# Patient Record
Sex: Female | Born: 1935 | Race: Black or African American | Hispanic: No | Smoking: Never smoker
Health system: Southern US, Community
[De-identification: ages and names within clinical notes are randomized; demographics above are authoritative.]

## PROBLEM LIST (undated history)

## (undated) DIAGNOSIS — K219 Gastro-esophageal reflux disease without esophagitis: Secondary | ICD-10-CM

## (undated) DIAGNOSIS — I10 Essential (primary) hypertension: Secondary | ICD-10-CM

## (undated) DIAGNOSIS — F419 Anxiety disorder, unspecified: Secondary | ICD-10-CM

## (undated) DIAGNOSIS — F0390 Unspecified dementia without behavioral disturbance: Secondary | ICD-10-CM

## (undated) DIAGNOSIS — E43 Unspecified severe protein-calorie malnutrition: Secondary | ICD-10-CM

## (undated) DIAGNOSIS — R1312 Dysphagia, oropharyngeal phase: Secondary | ICD-10-CM

## (undated) DIAGNOSIS — E875 Hyperkalemia: Secondary | ICD-10-CM

## (undated) DIAGNOSIS — D649 Anemia, unspecified: Secondary | ICD-10-CM

## (undated) HISTORY — DX: Dysphagia, oropharyngeal phase: R13.12

## (undated) HISTORY — PX: APPENDECTOMY: SHX54

## (undated) HISTORY — DX: Anemia, unspecified: D64.9

## (undated) HISTORY — DX: Hyperkalemia: E87.5

## (undated) HISTORY — DX: Gastro-esophageal reflux disease without esophagitis: K21.9

## (undated) HISTORY — DX: Unspecified severe protein-calorie malnutrition: E43

## (undated) HISTORY — PX: ABDOMINAL HYSTERECTOMY: SHX81

---

## 2012-12-04 ENCOUNTER — Encounter (HOSPITAL_COMMUNITY): Payer: Self-pay | Admitting: *Deleted

## 2012-12-04 ENCOUNTER — Emergency Department (HOSPITAL_COMMUNITY)
Admission: EM | Admit: 2012-12-04 | Discharge: 2012-12-04 | Disposition: A | Discharge status: Home / Self Care | Payer: Medicare Other | Attending: Emergency Medicine | Admitting: Emergency Medicine

## 2012-12-04 DIAGNOSIS — Z8659 Personal history of other mental and behavioral disorders: Secondary | ICD-10-CM | POA: Insufficient documentation

## 2012-12-04 DIAGNOSIS — F0390 Unspecified dementia without behavioral disturbance: Secondary | ICD-10-CM | POA: Insufficient documentation

## 2012-12-04 DIAGNOSIS — I10 Essential (primary) hypertension: Secondary | ICD-10-CM | POA: Insufficient documentation

## 2012-12-04 DIAGNOSIS — Z4802 Encounter for removal of sutures: Secondary | ICD-10-CM

## 2012-12-04 HISTORY — DX: Unspecified dementia, unspecified severity, without behavioral disturbance, psychotic disturbance, mood disturbance, and anxiety: F03.90

## 2012-12-04 HISTORY — DX: Essential (primary) hypertension: I10

## 2012-12-04 HISTORY — DX: Anxiety disorder, unspecified: F41.9
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
## 2012-12-04 NOTE — ED Notes (Signed)
Here for suture removal,  Pt is from [facility]. Alert.

## 2012-12-04 NOTE — ED Notes (Signed)
Here for suture removal from scalp, Pt is from [facility], Has an attendant with her.  Alert, talking, but hx of dementia.  Thinks sutures were put in at [facility].   Area had lg amt of scabbing.  .  Sutures removed. And released in care of attendant.

## 2012-12-05 NOTE — ED Provider Notes (Signed)
Medical screening examination/treatment/procedure(s) were performed by non-physician practitioner and as supervising physician I was immediately available for consultation/collaboration.    Dot Lanes, MD 12/05/12 [phone]

## 2012-12-22 ENCOUNTER — Encounter (HOSPITAL_COMMUNITY): Payer: Self-pay | Admitting: *Deleted

## 2012-12-22 ENCOUNTER — Emergency Department (HOSPITAL_COMMUNITY)
Admission: EM | Admit: 2012-12-22 | Discharge: 2012-12-22 | Disposition: A | Discharge status: Home / Self Care | Payer: Medicare Other | Attending: Emergency Medicine | Admitting: Emergency Medicine

## 2012-12-22 ENCOUNTER — Other Ambulatory Visit: Payer: Self-pay

## 2012-12-22 DIAGNOSIS — F0390 Unspecified dementia without behavioral disturbance: Secondary | ICD-10-CM | POA: Insufficient documentation

## 2012-12-22 DIAGNOSIS — Z8659 Personal history of other mental and behavioral disorders: Secondary | ICD-10-CM | POA: Insufficient documentation

## 2012-12-22 DIAGNOSIS — N390 Urinary tract infection, site not specified: Secondary | ICD-10-CM

## 2012-12-22 DIAGNOSIS — I10 Essential (primary) hypertension: Secondary | ICD-10-CM | POA: Insufficient documentation

## 2012-12-22 DIAGNOSIS — R609 Edema, unspecified: Secondary | ICD-10-CM | POA: Insufficient documentation

## 2012-12-22 LAB — CBC WITH DIFFERENTIAL/PLATELET
Basophils Absolute: 0 10*3/uL (ref 0.0–0.1)
Basophils Relative: 1 % (ref 0–1)
Eosinophils Absolute: 0.2 10*3/uL (ref 0.0–0.7)
Eosinophils Relative: 5 % (ref 0–5)
HCT: 28 % — ABNORMAL LOW (ref 36.0–46.0)
Hemoglobin: 9.4 g/dL — ABNORMAL LOW (ref 12.0–15.0)
Lymphocytes Relative: 25 % (ref 12–46)
Lymphs Abs: 1 10*3/uL (ref 0.7–4.0)
MCH: 33.6 pg (ref 26.0–34.0)
MCHC: 33.6 g/dL (ref 30.0–36.0)
MCV: 100 fL (ref 78.0–100.0)
Monocytes Absolute: 0.5 10*3/uL (ref 0.1–1.0)
Monocytes Relative: 11 % (ref 3–12)
Neutro Abs: 2.4 10*3/uL (ref 1.7–7.7)
Neutrophils Relative %: 58 % (ref 43–77)
Platelets: 210 10*3/uL (ref 150–400)
RBC: 2.8 MIL/uL — ABNORMAL LOW (ref 3.87–5.11)
RDW: 15.6 % — ABNORMAL HIGH (ref 11.5–15.5)
WBC: 4.1 10*3/uL (ref 4.0–10.5)

## 2012-12-22 LAB — URINALYSIS, ROUTINE W REFLEX MICROSCOPIC
Bilirubin Urine: NEGATIVE
Glucose, UA: NEGATIVE mg/dL
Ketones, ur: NEGATIVE mg/dL
Nitrite: POSITIVE — AB
Protein, ur: NEGATIVE mg/dL
Specific Gravity, Urine: 1.01 (ref 1.005–1.030)
Urobilinogen, UA: 0.2 mg/dL (ref 0.0–1.0)
pH: 6.5 (ref 5.0–8.0)

## 2012-12-22 LAB — URINE MICROSCOPIC-ADD ON

## 2012-12-22 LAB — COMPREHENSIVE METABOLIC PANEL
ALT: 14 U/L (ref 0–35)
AST: 26 U/L (ref 0–37)
Albumin: 3.6 g/dL (ref 3.5–5.2)
Alkaline Phosphatase: 143 U/L — ABNORMAL HIGH (ref 39–117)
BUN: 19 mg/dL (ref 6–23)
CO2: 25 mEq/L (ref 19–32)
Calcium: 9.4 mg/dL (ref 8.4–10.5)
Chloride: 103 mEq/L (ref 96–112)
Creatinine, Ser: 1.07 mg/dL (ref 0.50–1.10)
GFR calc Af Amer: 52 mL/min — ABNORMAL LOW (ref >90)
GFR calc non Af Amer: 45 mL/min — ABNORMAL LOW (ref >90)
Glucose, Bld: 78 mg/dL (ref 70–99)
Potassium: 4.8 mEq/L (ref 3.5–5.1)
Sodium: 139 mEq/L (ref 135–145)
Total Bilirubin: 0.2 mg/dL — ABNORMAL LOW (ref 0.3–1.2)
Total Protein: 7.1 g/dL (ref 6.0–8.3)

## 2012-12-22 MED ORDER — CEFTRIAXONE SODIUM 1 G IJ SOLR
1.0000 g | Freq: Once | INTRAMUSCULAR | Status: AC
  Administered 2012-12-22: 1 g via INTRAMUSCULAR
  Filled 2012-12-22: qty 10

## 2012-12-22 MED ORDER — LIDOCAINE HCL (PF) 1 % IJ SOLN
INTRAMUSCULAR | Status: AC
  Administered 2012-12-22: 5 mL
  Filled 2012-12-22: qty 5

## 2012-12-22 MED ORDER — CEPHALEXIN 500 MG PO CAPS: 500.0000 mg | ORAL_CAPSULE | Freq: Four times a day (QID) | ORAL | Status: DC

## 2012-12-22 NOTE — ED Provider Notes (Signed)
History     This chart was scribed for Virgel Manifold, MD by Jeralyn Ruths, ED Scribe. The patient was seen in room APA05/APA05 and the patient's care was started at 2:45 PM.  CSN: [id number]  Arrival date & time 12/22/12  1356  Chief Complaint  Patient presents with  . Weakness   The history is provided by the patient and medical records. No language interpreter was used.  Pt is a level 5 caveat due to dementia. HPI Comments: Morgan [name] is a 79 y.o. female with a hx of HTN, who presents to the Emergency Department from [facility] complaining of ongoing intermittent fatigue for the past month.  Nursing home staff reports urinary incontinence this morning.  Pt denies recent falls, headache, diaphoresis, fever, chills, nausea, vomiting, diarrhea, cough, SOB and any other pain. Pt denies new medications or changes in diet.  Past Medical History  Diagnosis Date  . Hypertension   . Dementia   . Anxiety     Past Surgical History  Procedure Laterality Date  . Appendectomy    . Abdominal hysterectomy      History reviewed. No pertinent family history.  History  Substance Use Topics  . Smoking status: Never Smoker   . Smokeless tobacco: Not on file  . Alcohol Use: No    OB History   Grav Para Term Preterm Abortions TAB SAB Ect Mult Living                  Review of Systems  Unable to perform ROS: Dementia    Allergies  Review of patient's allergies indicates no known allergies.  Home Medications  No current outpatient prescriptions on file.  Triage Vitals:BP 156/76  Pulse 78  Temp(Src) 97 F (36.1 C) (Oral)  Resp 20  Wt 130 lb (58.968 kg)  SpO2 100%  Physical Exam  Nursing note and vitals reviewed. Constitutional: She appears well-developed and well-nourished. No distress.  HENT:  Head: Normocephalic and atraumatic.  Eyes: Conjunctivae are normal. Pupils are equal, round, and reactive to light. Right eye exhibits no discharge. Left eye exhibits no  discharge.  Neck: Neck supple.  Cardiovascular: Normal rate, regular rhythm and normal heart sounds.  Exam reveals no gallop and no friction rub.   No murmur heard. Pulmonary/Chest: Effort normal and breath sounds normal. No respiratory distress.  Abdominal: Soft. She exhibits no distension. There is no tenderness.  Musculoskeletal: She exhibits no edema and no tenderness.  Pitting lower extremity edema, right worse than left.  Neurological: She is alert.  Skin: Skin is warm and dry.  Psychiatric: She has a normal mood and affect. Her behavior is normal. Thought content normal.    ED Course  Procedures (including critical care time) DIAGNOSTIC STUDIES: Oxygen Saturation is 100% on RA, normal by my interpretation.    COORDINATION OF CARE: 2:50 PM - Discussed ED treatment with pt at bedside including labs and UA and pt agrees.     Labs Reviewed  CBC WITH DIFFERENTIAL - Abnormal; Notable for the following:    RBC 2.80 (*)    Hemoglobin 9.4 (*)    HCT 28.0 (*)    RDW 15.6 (*)    All other components within normal limits  COMPREHENSIVE METABOLIC PANEL - Abnormal; Notable for the following:    Alkaline Phosphatase 143 (*)    Total Bilirubin 0.2 (*)    GFR calc non Af Amer 45 (*)    GFR calc Af Amer 52 (*)  All other components within normal limits  URINALYSIS, ROUTINE W REFLEX MICROSCOPIC - Abnormal; Notable for the following:    APPearance HAZY (*)    Hgb urine dipstick TRACE (*)    Nitrite POSITIVE (*)    Leukocytes, UA LARGE (*)    All other components within normal limits  URINE MICROSCOPIC-ADD ON - Abnormal; Notable for the following:    Squamous Epithelial / LPF FEW (*)    Bacteria, UA MANY (*)    All other components within normal limits  URINE CULTURE   No results found.  EKG:  Rhythm: Normal sinus rhythm Vent. rate 71 BPM PR interval 158 ms QRS duration 76 ms QT/QTc 384/417 ms Poor R wave progression Left ventricular hypertrophy ST segments: Nonspecific  ST changes. There is some T wave flattening in aVL. Comparison: None   1. UTI (urinary tract infection)       MDM  79 year old female with some generalized weakness and urinary incontinence today which is unusual for her. Likely secondary to urinary tract infection. Urinalysis is consistent with this. Patient is hemodynamically stable.I feel she is appropriate for outpatient treatment at this time. She'll be given a dose of Rocephin prior to discharge.     I personally preformed the services scribed in my presence. The recorded information has been reviewed is accurate. Virgel Manifold, MD.    Virgel Manifold, MD 12/22/12 [phone]

## 2012-12-22 NOTE — ED Notes (Signed)
Pt is from [facility].  With attendent.  Here for eval of weakness,  And urinary incontinence.

## 2012-12-22 NOTE — ED Notes (Signed)
[name] notified of patient being discharged.

## 2012-12-24 LAB — URINE CULTURE: Colony Count: >=100000

## 2013-02-13 ENCOUNTER — Encounter (HOSPITAL_COMMUNITY): Payer: Self-pay | Admitting: *Deleted

## 2013-02-13 ENCOUNTER — Emergency Department (HOSPITAL_COMMUNITY): Payer: Medicare Other

## 2013-02-13 ENCOUNTER — Observation Stay (HOSPITAL_COMMUNITY)
Admission: EM | Admit: 2013-02-13 | Discharge: 2013-02-14 | Disposition: A | Discharge status: Skilled Nursing Facility | Payer: Medicare Other | Attending: Internal Medicine | Admitting: Internal Medicine

## 2013-02-13 DIAGNOSIS — I129 Hypertensive chronic kidney disease with stage 1 through stage 4 chronic kidney disease, or unspecified chronic kidney disease: Secondary | ICD-10-CM

## 2013-02-13 DIAGNOSIS — N289 Disorder of kidney and ureter, unspecified: Secondary | ICD-10-CM

## 2013-02-13 DIAGNOSIS — N179 Acute kidney failure, unspecified: Principal | ICD-10-CM

## 2013-02-13 DIAGNOSIS — R5381 Other malaise: Secondary | ICD-10-CM | POA: Insufficient documentation

## 2013-02-13 DIAGNOSIS — R531 Weakness: Secondary | ICD-10-CM

## 2013-02-13 DIAGNOSIS — N183 Chronic kidney disease, stage 3 unspecified: Secondary | ICD-10-CM

## 2013-02-13 DIAGNOSIS — F0390 Unspecified dementia without behavioral disturbance: Secondary | ICD-10-CM

## 2013-02-13 DIAGNOSIS — Z79899 Other long term (current) drug therapy: Secondary | ICD-10-CM | POA: Insufficient documentation

## 2013-02-13 DIAGNOSIS — I10 Essential (primary) hypertension: Secondary | ICD-10-CM | POA: Insufficient documentation

## 2013-02-13 DIAGNOSIS — D649 Anemia, unspecified: Secondary | ICD-10-CM

## 2013-02-13 DIAGNOSIS — E875 Hyperkalemia: Secondary | ICD-10-CM

## 2013-02-13 LAB — BASIC METABOLIC PANEL
CO2: 22 mEq/L (ref 19–32)
Chloride: 105 mEq/L (ref 96–112)
Creatinine, Ser: 1.37 mg/dL — ABNORMAL HIGH (ref 0.50–1.10)
GFR calc Af Amer: 38 mL/min — ABNORMAL LOW (ref >90)
Sodium: 135 mEq/L (ref 135–145)

## 2013-02-13 LAB — URINALYSIS, ROUTINE W REFLEX MICROSCOPIC
Glucose, UA: NEGATIVE mg/dL
Nitrite: NEGATIVE
Specific Gravity, Urine: 1.015 (ref 1.005–1.030)
pH: 6 (ref 5.0–8.0)

## 2013-02-13 LAB — CBC WITH DIFFERENTIAL/PLATELET
Basophils Absolute: 0 10*3/uL (ref 0.0–0.1)
Eosinophils Relative: 1 % (ref 0–5)
HCT: 24.7 % — ABNORMAL LOW (ref 36.0–46.0)
Lymphocytes Relative: 23 % (ref 12–46)
Lymphs Abs: 1.5 10*3/uL (ref 0.7–4.0)
MCV: 98.4 fL (ref 78.0–100.0)
Monocytes Absolute: 0.8 10*3/uL (ref 0.1–1.0)
Neutro Abs: 4 10*3/uL (ref 1.7–7.7)
Platelets: 204 10*3/uL (ref 150–400)
RBC: 2.51 MIL/uL — ABNORMAL LOW (ref 3.87–5.11)
RDW: 13.6 % (ref 11.5–15.5)
WBC: 6.3 10*3/uL (ref 4.0–10.5)

## 2013-02-13 LAB — URINE MICROSCOPIC-ADD ON

## 2013-02-13 LAB — TROPONIN I: Troponin I: <0.3 ng/mL (ref <0.30)

## 2013-02-13 MED ORDER — INSULIN ASPART 100 UNIT/ML ~~LOC~~ SOLN
10.0000 [IU] | Freq: Once | SUBCUTANEOUS | Status: AC
  Administered 2013-02-13: 10 [IU] via INTRAVENOUS
  Filled 2013-02-13: qty 1

## 2013-02-13 MED ORDER — LABETALOL HCL 200 MG PO TABS
200.0000 mg | ORAL_TABLET | Freq: Two times a day (BID) | ORAL | Status: DC
  Administered 2013-02-13 – 2013-02-14 (×2): 200 mg via ORAL
  Filled 2013-02-13 (×6): qty 1

## 2013-02-13 MED ORDER — DEXTROSE 50 % IV SOLN
1.0000 | Freq: Once | INTRAVENOUS | Status: AC
  Administered 2013-02-13: 50 mL via INTRAVENOUS

## 2013-02-13 MED ORDER — SODIUM CHLORIDE 0.9 % IV BOLUS (SEPSIS)
500.0000 mL | Freq: Once | INTRAVENOUS | Status: AC
  Administered 2013-02-13: 500 mL via INTRAVENOUS

## 2013-02-13 MED ORDER — ONDANSETRON HCL 4 MG PO TABS: 4.0000 mg | ORAL_TABLET | Freq: Four times a day (QID) | ORAL | Status: DC | PRN

## 2013-02-13 MED ORDER — SODIUM POLYSTYRENE SULFONATE 15 GM/60ML PO SUSP
30.0000 g | Freq: Once | ORAL | Status: AC
  Administered 2013-02-13: 30 g via ORAL
  Filled 2013-02-13: qty 120

## 2013-02-13 MED ORDER — HYDRALAZINE HCL 20 MG/ML IJ SOLN
10.0000 mg | INTRAMUSCULAR | Status: DC | PRN
  Administered 2013-02-13 – 2013-02-14 (×2): 10 mg via INTRAVENOUS
  Filled 2013-02-13 (×2): qty 1

## 2013-02-13 MED ORDER — SODIUM CHLORIDE 0.9 % IJ SOLN: 3.0000 mL | Freq: Two times a day (BID) | INTRAMUSCULAR | Status: DC

## 2013-02-13 MED ORDER — MEMANTINE HCL 10 MG PO TABS
10.0000 mg | ORAL_TABLET | Freq: Two times a day (BID) | ORAL | Status: DC
Start: 2013-02-13 — End: 2013-02-14
  Administered 2013-02-13 – 2013-02-14 (×2): 10 mg via ORAL
  Filled 2013-02-13 (×6): qty 1

## 2013-02-13 MED ORDER — MEGESTROL ACETATE 400 MG/10ML PO SUSP
ORAL | Status: AC
  Filled 2013-02-13: qty 10

## 2013-02-13 MED ORDER — CARBOXYMETHYLCELLULOSE SODIUM 0.5 % OP SOLN: 1.0000 [drp] | Freq: Three times a day (TID) | OPHTHALMIC | Status: DC

## 2013-02-13 MED ORDER — MEGESTROL ACETATE 400 MG/10ML PO SUSP
200.0000 mg | Freq: Two times a day (BID) | ORAL | Status: DC
  Administered 2013-02-14: 200 mg via ORAL
  Filled 2013-02-13: qty 10
  Filled 2013-02-13 (×4): qty 5

## 2013-02-13 MED ORDER — POLYVINYL ALCOHOL 1.4 % OP SOLN
1.0000 [drp] | Freq: Three times a day (TID) | OPHTHALMIC | Status: DC
  Administered 2013-02-13 – 2013-02-14 (×3): 1 [drp] via OPHTHALMIC
  Filled 2013-02-13 (×3): qty 15

## 2013-02-13 MED ORDER — SODIUM CHLORIDE 0.9 % IV SOLN: INTRAVENOUS | Status: DC

## 2013-02-13 MED ORDER — SODIUM CHLORIDE 0.9 % IV SOLN
INTRAVENOUS | Status: DC
  Administered 2013-02-13 – 2013-02-14 (×3): via INTRAVENOUS

## 2013-02-13 MED ORDER — ACETAMINOPHEN 650 MG RE SUPP: 650.0000 mg | Freq: Four times a day (QID) | RECTAL | Status: DC | PRN

## 2013-02-13 MED ORDER — CLONIDINE HCL 0.2 MG PO TABS
0.2000 mg | ORAL_TABLET | Freq: Two times a day (BID) | ORAL | Status: DC
  Administered 2013-02-13 – 2013-02-14 (×2): 0.2 mg via ORAL
  Filled 2013-02-13 (×2): qty 1

## 2013-02-13 MED ORDER — SODIUM CHLORIDE 0.9 % IV SOLN
1.0000 g | Freq: Once | INTRAVENOUS | Status: AC
  Administered 2013-02-13: 1 g via INTRAVENOUS
  Filled 2013-02-13: qty 10

## 2013-02-13 MED ORDER — ONDANSETRON HCL 4 MG/2ML IJ SOLN: 4.0000 mg | Freq: Four times a day (QID) | INTRAMUSCULAR | Status: DC | PRN

## 2013-02-13 MED ORDER — RISPERIDONE 1 MG PO TABS
1.5000 mg | ORAL_TABLET | Freq: Two times a day (BID) | ORAL | Status: DC
  Administered 2013-02-13 – 2013-02-14 (×2): 1.5 mg via ORAL
  Filled 2013-02-13 (×6): qty 1

## 2013-02-13 MED ORDER — DEXTROSE 50 % IV SOLN
INTRAVENOUS | Status: AC
  Filled 2013-02-13: qty 50

## 2013-02-13 MED ORDER — SODIUM BICARBONATE 8.4 % IV SOLN
50.0000 meq | Freq: Once | INTRAVENOUS | Status: AC
  Administered 2013-02-13: 50 meq via INTRAVENOUS
  Filled 2013-02-13: qty 50

## 2013-02-13 MED ORDER — ACETAMINOPHEN 325 MG PO TABS: 650.0000 mg | ORAL_TABLET | Freq: Four times a day (QID) | ORAL | Status: DC | PRN

## 2013-02-13 NOTE — H&P (Signed)
History and Physical  Morgan [name] [id number] DOB: Jan 03, 1921 DOA: 02/13/2013  Referring physician: Rolland Porter, MD PCP: [name], MD   Chief Complaint: No complaints  HPI:  79 year old woman with dementia who was sent to the emergency department from [facility] for somnolence and decreased level of activity. Initial evaluation in the emergency department was notable for acute renal failure and hyperkalemia in patient was referred for admission.  History obtained from chart as well as facility nurse familiar with patient. Patient has dementia and cannot recall any particular problems, is unaware why she is here and has no complaints. The facility reports usually treated for a UTI and that for the last several days she has been sleeping more than usual. Because of her continued somnolence they were concerned about recurrent infection. In discussion with facility attending the decision was made to discontinue her daytime Xanax and patient was referred to the emergency department. There is no history of neurologic focal deficits or other particular problems reported. Normally the patient eats a regular diet with thin liquids and ambulates with a walker. The patient's power of attorney also noted that she has been more sleepy as of late.  In the emergency department she was noted to be afebrile stable vital signs. Screening laboratory studies were notable for elevated BUN and creatinine above baseline, modest hyperkalemia (treated with D50, insulin, sodium bicarbonate, calcium gluconate and Kayexalate). Hemoglobin was 8.4 somewhat below last study 12/2012. Urinalysis essentially negative. EKG was nonacute. Chest x-ray was negative.  Review of Systems:  Negative for fever, visual changes, sore throat, rash, new muscle aches, chest pain, SOB, dysuria, bleeding, n/v/abdominal pain.  Past Medical History  Diagnosis Date  . Hypertension   . Dementia   . Anxiety     Past  Surgical History  Procedure Laterality Date  . Appendectomy    . Abdominal hysterectomy      Social History:  reports that she has never smoked. She does not have any smokeless tobacco history on file. She reports that she does not drink alcohol or use illicit drugs.  No Known Allergies  No family history on file. Patient cannot recall any particular medical problems in her family.  Prior to Admission medications   Medication Sig Start Date End Date Taking? Authorizing Provider  ALPRAZolam (XANAX) 0.25 MG tablet Take 0.25 mg by mouth 3 (three) times daily.    Yes Historical Provider, MD  carboxymethylcellulose (REFRESH PLUS) 0.5 % SOLN Place 1 drop into both eyes 3 (three) times daily.   Yes Historical Provider, MD  cloNIDine (CATAPRES) 0.2 MG tablet Take 0.2 mg by mouth 2 (two) times daily.   Yes Historical Provider, MD  labetalol (NORMODYNE) 200 MG tablet Take 200 mg by mouth 2 (two) times daily.   Yes Historical Provider, MD  megestrol (MEGACE) 40 MG/ML suspension Take 200 mg by mouth 2 (two) times daily.   Yes Historical Provider, MD  memantine (NAMENDA) 10 MG tablet Take 10 mg by mouth 2 (two) times daily.   Yes Historical Provider, MD  risperiDONE (RISPERDAL) 1 MG tablet Take 1.5 mg by mouth 2 (two) times daily.   Yes Historical Provider, MD   Physical Exam: Filed Vitals:   02/13/13 0953 02/13/13 1211 02/13/13 1405 02/13/13 1644  BP: 113/64 185/91 168/87 166/96  Pulse: 71 81 72 86  Temp: 97.7 F (36.5 C)     TempSrc: Oral     Resp: 20 20  18   Weight: 44.453 kg (98 lb)  SpO2: 98% 100% 100% 99%    General: Examined in the emergency department. Sitting up on stretcher, eating dinner. Appears calm and comfortable. Frail. Eyes pupils equal, round. Normal lids, irises. Wears glasses. Bitemporal wasting seen. ENT appears grossly normal Cardiovascular regular rate and rhythm. No murmur, rub, gallop. No lower extremity edema. Respiratory clear to auscultation bilaterally. No  wheezes, rales, rhonchi. Normal respiratory effort. Abdomen soft, nontender, nondistended. Positive bowel sounds. Skin: No rash or induration seen. Musculoskeletal: Grossly normal tone and strength bilateral upper and lower extremities. Psychiatric grossly normal mood and affect. Speech fluent and appropriate. Neurologic: Cranial nerves appear grossly intact. No focal deficits. Alert and oriented to herself, hospital. Not month or year.  Wt Readings from Last 3 Encounters:  02/13/13 44.453 kg (98 lb)  12/22/12 58.968 kg (130 lb)    Labs on Admission:  Basic Metabolic Panel:  Recent Labs Lab 02/13/13 1022  NA 135  K 5.6*  CL 105  CO2 22  GLUCOSE 88  BUN 46*  CREATININE 1.37*  CALCIUM 10.0    CBC:  Recent Labs Lab 02/13/13 1022  WBC 6.3  NEUTROABS 4.0  HGB 8.4*  HCT 24.7*  MCV 98.4  PLT 204    Cardiac Enzymes:  Recent Labs Lab 02/13/13 1022  TROPONINI <0.30   Radiological Exams on Admission: Dg Chest Portable 1 View  02/13/2013   *RADIOLOGY REPORT*  Clinical Data: Fatigue.  PORTABLE CHEST - 1 VIEW  Comparison: Chest x-ray dated 12/06/2010  Findings: Heart size and pulmonary vascularity are within normal limits.  Lungs are clear although the lungs are hyperinflated with flattening of the diaphragms suggesting emphysema.  Moderate thoracolumbar scoliosis.  Severe arthritis at both shoulders.  IMPRESSION: No acute abnormalities.   Original Report Authenticated By: Lorriane Shire, M.D.    EKG: Independently reviewed. Normal sinus rhythm. Septal infarct, old. No acute changes.   Principal Problem:   Acute renal failure Active Problems:   Hyperkalemia   Normocytic anemia   Hypertension   Dementia   Assessment/Plan 1. Acute renal failure: secondary to dehydration, poor oral intake. IV fluids. 2. Hyperkalemia: Likely secondary to acute renal failure. IV fluids. No EKG changes. Repeat basic metabolic panel in the morning. 3. Normocytic anemia: No history of  bleeding. Follow clinically. 4. Reported lethargy: No evidence of this at this point. Likely secondary to benzodiazepines. Discontinue benzodiazepines for now. No focal neurologic deficits. 5. Recent UTI: Urinalysis negative. 6. HTN: continue clonidine and labetalol. 7. Dementia: stable. Continue Namenda. Regular diet, thin  Discussed above, laboratory studies and workup as well as treatment plan and observation with healthcare power of attorney Morgan [name].  Code Status: Presumed full code. No history of advanced rectal per facility.  DVT prophylaxis: Lovenox Family Communication: Niece Morgan [name] Disposition Plan/Anticipated LOS: Observation. Likely return to facility 7/2.  Time spent: 50 minutes  Murray Hodgkins, MD  [facility] Pager [phone] 02/13/2013, 5:02 PM

## 2013-02-13 NOTE — ED Notes (Signed)
Pt presents to er from [facility] with c/o generalized weakness, ?uti that started a week ago according to staff with pt. Pt able to answer questions but is confused,

## 2013-02-13 NOTE — ED Provider Notes (Signed)
History   This chart was scribed for Janice Norrie, MD, by Neta Ehlers, ED Scribe. This patient was seen in room APA01/APA01 and the patient's care was started at 10:12 AM.    CSN: [id number] Arrival date & time 02/13/13  0944  First MD Initiated Contact with Patient 02/13/13 [phone]     Chief Complaint  Patient presents with  . Fatigue    The history is provided by the patient, the nursing home and the EMS personnel. The history is limited by the condition of the patient. No language interpreter was used.    LEVEL 5 CAVEAT (Dementia)  HPI Comments: Morgan [name] is a 79 y.o. female who presents to the Emergency Department from her nursing home who states she has general weakness. She denies feeling bad, and she is unaware of why she is in the hospital. She has a h/o of dementia, HTN, and abdominal surgery. She denies smoking or drinking.   The pt's PCP is [facility] (Dr Sherrie Sport)   Past Medical History  Diagnosis Date  . Hypertension   . Dementia   . Anxiety    Past Surgical History  Procedure Laterality Date  . Appendectomy    . Abdominal hysterectomy     No family history on file. History  Substance Use Topics  . Smoking status: Never Smoker   . Smokeless tobacco: Not on file  . Alcohol Use: No   Lives in NH  No OB history provided.   Review of Systems  Unable to perform ROS: Dementia    Allergies  Review of patient's allergies indicates no known allergies.  Home Medications   Current Outpatient Rx  Name  Route  Sig  Dispense  Refill  . ALPRAZolam (XANAX) 0.25 MG tablet   Oral   Take 0.25 mg by mouth 3 (three) times daily.          . carboxymethylcellulose (REFRESH PLUS) 0.5 % SOLN   Both Eyes   Place 1 drop into both eyes 3 (three) times daily.         . cloNIDine (CATAPRES) 0.2 MG tablet   Oral   Take 0.2 mg by mouth 2 (two) times daily.         Marland Kitchen labetalol (NORMODYNE) 200 MG tablet   Oral   Take 200 mg by mouth 2 (two) times  daily.         . megestrol (MEGACE) 40 MG/ML suspension   Oral   Take 200 mg by mouth 2 (two) times daily.         . memantine (NAMENDA) 10 MG tablet   Oral   Take 10 mg by mouth 2 (two) times daily.         . risperiDONE (RISPERDAL) 1 MG tablet   Oral   Take 1.5 mg by mouth 2 (two) times daily.          Triage Vitals: BP 113/64  Pulse 71  Temp(Src) 97.7 F (36.5 C) (Oral)  Resp 20  Wt 98 lb (44.453 kg)  SpO2 98%  Vital signs normal     Physical Exam  Nursing note and vitals reviewed. Constitutional:  Non-toxic appearance. She does not appear ill. No distress.  Elderly frail female  HENT:  Head: Normocephalic and atraumatic.  Right Ear: External ear normal.  Left Ear: External ear normal.  Nose: Nose normal. No mucosal edema or rhinorrhea.  Mouth/Throat: Mucous membranes are normal. No dental abscesses or edematous.  Mouth has white food  particles in it, possibly crackers or cereal. MM appear dry  Eyes: Conjunctivae and EOM are normal. Pupils are equal, round, and reactive to light.  Pupils are myotic.   Neck: Normal range of motion and full passive range of motion without pain. Neck supple.  Cardiovascular: Normal rate, regular rhythm and normal heart sounds.  Exam reveals no gallop and no friction rub.   No murmur heard. Pulmonary/Chest: Effort normal and breath sounds normal. No respiratory distress. She has no wheezes. She has no rhonchi. She has no rales. She exhibits no tenderness and no crepitus.  Abdominal: Soft. Normal appearance and bowel sounds are normal. She exhibits no distension. There is no tenderness. There is no rebound and no guarding.  Musculoskeletal: Normal range of motion. She exhibits no edema and no tenderness.  Moves all extremities well.   Neurological: She has normal strength. No cranial nerve deficit.  Sleeping, easily awakened, confused  Skin: Skin is warm, dry and intact. No rash noted. No erythema. No pallor.  Psychiatric: Her  speech is normal. Her mood appears not anxious.  Flat affect    ED Course  Procedures (including critical care time)  Medications  0.9 %  sodium chloride infusion ( Intravenous Stopped 02/13/13 1441)  calcium gluconate 1 g in sodium chloride 0.9 % 100 mL IVPB (not administered)  sodium bicarbonate injection 50 mEq (not administered)  dextrose 50 % solution (not administered)  sodium chloride 0.9 % bolus 500 mL (0 mLs Intravenous Stopped 02/13/13 1146)  dextrose 50 % solution 50 mL (50 mLs Intravenous Given 02/13/13 1612)  sodium polystyrene (KAYEXALATE) 15 GM/60ML suspension 30 g (30 g Oral Given 02/13/13 1611)  insulin aspart (novoLOG) injection 10 Units (10 Units Intravenous Given 02/13/13 1616)     DIAGNOSTIC STUDIES: Oxygen Saturation is 98% on room air, normal by my interpretation.    COORDINATION OF CARE:  10:19 AM- Discussed treatment plan with pt, and pt agreed.   Nurses states ambulates with assistance when she turns. Pt was given food to eat.   16:28 Dr Sarajane Jews, admit to tele, observation, team 1  Results for orders placed during the hospital encounter of 02/13/13  URINALYSIS, ROUTINE W REFLEX MICROSCOPIC      Result Value Range   Color, Urine YELLOW  YELLOW   APPearance CLEAR  CLEAR   Specific Gravity, Urine 1.015  1.005 - 1.030   pH 6.0  5.0 - 8.0   Glucose, UA NEGATIVE  NEGATIVE mg/dL   Hgb urine dipstick NEGATIVE  NEGATIVE   Bilirubin Urine NEGATIVE  NEGATIVE   Ketones, ur NEGATIVE  NEGATIVE mg/dL   Protein, ur NEGATIVE  NEGATIVE mg/dL   Urobilinogen, UA 0.2  0.0 - 1.0 mg/dL   Nitrite NEGATIVE  NEGATIVE   Leukocytes, UA TRACE (*) NEGATIVE  BASIC METABOLIC PANEL      Result Value Range   Sodium 135  135 - 145 mEq/L   Potassium 5.6 (*) 3.5 - 5.1 mEq/L   Chloride 105  96 - 112 mEq/L   CO2 22  19 - 32 mEq/L   Glucose, Bld 88  70 - 99 mg/dL   BUN 46 (*) 6 - 23 mg/dL   Creatinine, Ser 1.37 (*) 0.50 - 1.10 mg/dL   Calcium 10.0  8.4 - 10.5 mg/dL   GFR calc non Af  Amer 33 (*) >90 mL/min   GFR calc Af Amer 38 (*) >90 mL/min  CBC WITH DIFFERENTIAL      Result Value Range   WBC  6.3  4.0 - 10.5 K/uL   RBC 2.51 (*) 3.87 - 5.11 MIL/uL   Hemoglobin 8.4 (*) 12.0 - 15.0 g/dL   HCT 24.7 (*) 36.0 - 46.0 %   MCV 98.4  78.0 - 100.0 fL   MCH 33.5  26.0 - 34.0 pg   MCHC 34.0  30.0 - 36.0 g/dL   RDW 13.6  11.5 - 15.5 %   Platelets 204  150 - 400 K/uL   Neutrophils Relative % 63  43 - 77 %   Neutro Abs 4.0  1.7 - 7.7 K/uL   Lymphocytes Relative 23  12 - 46 %   Lymphs Abs 1.5  0.7 - 4.0 K/uL   Monocytes Relative 13 (*) 3 - 12 %   Monocytes Absolute 0.8  0.1 - 1.0 K/uL   Eosinophils Relative 1  0 - 5 %   Eosinophils Absolute 0.0  0.0 - 0.7 K/uL   Basophils Relative 0  0 - 1 %   Basophils Absolute 0.0  0.0 - 0.1 K/uL  TROPONIN I      Result Value Range   Troponin I <0.30  <0.30 ng/mL  URINE MICROSCOPIC-ADD ON      Result Value Range   Squamous Epithelial / LPF FEW (*) RARE   WBC, UA 0-2  <3 WBC/hpf   Bacteria, UA FEW (*) RARE   Laboratory interpretation all normal except mildly worsening of stable anemia, since May now has renal insuffiency (May 9 BUN 19/creat 1.07)   Dg Chest Portable 1 View  02/13/2013   *RADIOLOGY REPORT*  Clinical Data: Fatigue.  PORTABLE CHEST - 1 VIEW  Comparison: Chest x-ray dated 12/06/2010  Findings: Heart size and pulmonary vascularity are within normal limits.  Lungs are clear although the lungs are hyperinflated with flattening of the diaphragms suggesting emphysema.  Moderate thoracolumbar scoliosis.  Severe arthritis at both shoulders.  IMPRESSION: No acute abnormalities.   Original Report Authenticated By: Lorriane Shire, M.D.     Date: 02/13/2013  Rate: 67  Rhythm: normal sinus rhythm  QRS Axis: normal  Intervals: normal  ST/T Wave abnormalities: normal  Conduction Disutrbances:none  Narrative Interpretation: septal infarct  Old EKG Reviewed: unchanged   1. Acute renal insufficiency   2. Hyperkalemia   3. Weakness        MDM  patient sent from nursing home with weakness. She appears to be dehydrated on exam with dry mucous membranes and food particles on her tongue. She does have a new renal insufficiency and new hyperkalemia which was treated. She is being admitted for IV hydration and to make sure her renal insufficiency and her hyperkalemia resolved.    I personally performed the services described in this documentation, which was scribed in my presence. The recorded information has been reviewed and considered.  Rolland Porter, MD, [facility]    Janice Norrie, MD 02/13/13 [phone]

## 2013-02-14 DIAGNOSIS — I10 Essential (primary) hypertension: Secondary | ICD-10-CM

## 2013-02-14 LAB — BASIC METABOLIC PANEL
BUN: 36 mg/dL — ABNORMAL HIGH (ref 6–23)
GFR calc Af Amer: 58 mL/min — ABNORMAL LOW (ref >90)
GFR calc non Af Amer: 50 mL/min — ABNORMAL LOW (ref >90)
Potassium: 5 mEq/L (ref 3.5–5.1)

## 2013-02-14 MED ORDER — CLONIDINE HCL 0.2 MG PO TABS: 0.2000 mg | ORAL_TABLET | Freq: Three times a day (TID) | ORAL | Status: DC

## 2013-02-14 NOTE — Progress Notes (Signed)
UR chart review completed.  

## 2013-02-14 NOTE — Discharge Summary (Signed)
Physician Discharge Summary  Morgan [name] [id number] DOB: [birth year] DOA: 02/13/2013  PCP: [name], MD  Admit date: 02/13/2013 Discharge date: 02/14/2013  Time spent: 40 minutes  Recommendations for Outpatient Follow-up:  1. Patient be discharged to skilled nursing facility. 2. Repeat basic metabolic panel in one week.  Discharge Diagnoses:  Principal Problem:   Acute renal failure Active Problems:   Hyperkalemia   Normocytic anemia   Hypertension   Dementia   Discharge Condition: Improved  Diet recommendation: Low salt  Filed Weights   02/13/13 0953 02/13/13 1736  Weight: 44.453 kg (98 lb) 44.453 kg (98 lb)    History of present illness:  79 year old woman with dementia who was sent to the emergency department from [facility] for somnolence and decreased level of activity. Initial evaluation in the emergency department was notable for acute renal failure and hyperkalemia in patient was referred for admission.  History obtained from chart as well as facility nurse familiar with patient. Patient has dementia and cannot recall any particular problems, is unaware why she is here and has no complaints. The facility reports usually treated for a UTI and that for the last several days she has been sleeping more than usual. Because of her continued somnolence they were concerned about recurrent infection. In discussion with facility attending the decision was made to discontinue her daytime Xanax and patient was referred to the emergency department. There is no history of neurologic focal deficits or other particular problems reported. Normally the patient eats a regular diet with thin liquids and ambulates with a walker. The patient's power of attorney also noted that she has been more sleepy as of late.  In the emergency department she was noted to be afebrile stable vital signs. Screening laboratory studies were notable for elevated BUN and creatinine above  baseline, modest hyperkalemia (treated with D50, insulin, sodium bicarbonate, calcium gluconate and Kayexalate). Hemoglobin was 8.4 somewhat below last study 12/2012. Urinalysis essentially negative. EKG was nonacute. Chest x-ray was negative   Hospital Course:  This lady was admitted to the hospital with increasing somnolence. Blood work indicated worsening renal failure, hyperkalemia probably secondary to dehydration. Patient was admitted the hospital overnight. She was hydrated with IV fluids and has had improvement in her lab values. Xanax was discontinued as it was felt contributing to lethargy. The patient is now awake, answering questions. She is confused but this appears to be her baseline. Urinalysis was negative for any infection, she does not have any other evidence of an infectious process. She will need a repeat basic metabolic panel in one week. She is felt safe to discharge back to skilled nursing facility.  Procedures:  None  Consultations:  none  Discharge Exam: Filed Vitals:   02/13/13 2223 02/13/13 2342 02/14/13 0537 02/14/13 0945  BP: 196/103 197/95 189/84 165/94  Pulse: 99  90   Temp: 98.8 F (37.1 C)  98.5 F (36.9 C)   TempSrc:   Oral   Resp: 18  18   Height:      Weight:      SpO2: 98%  98%     General: No acute distress, awake, confused Cardiovascular: S1, S2, regular rate and rhythm Respiratory: Clear to auscultation bilaterally  Discharge Instructions  Discharge Orders   Future Orders Complete By Expires     Call MD for:  extreme fatigue  As directed     Call MD for:  persistant dizziness or light-headedness  As directed  Call MD for:  temperature >100.4  As directed     Diet - low sodium heart healthy  As directed     Increase activity slowly  As directed         Medication List    STOP taking these medications       ALPRAZolam 0.25 MG tablet  Commonly known as:  XANAX      TAKE these medications       carboxymethylcellulose 0.5 %  Soln  Commonly known as:  REFRESH PLUS  Place 1 drop into both eyes 3 (three) times daily.     cloNIDine 0.2 MG tablet  Commonly known as:  CATAPRES  Take 1 tablet (0.2 mg total) by mouth 3 (three) times daily.     labetalol 200 MG tablet  Commonly known as:  NORMODYNE  Take 200 mg by mouth 2 (two) times daily.     megestrol 40 MG/ML suspension  Commonly known as:  MEGACE  Take 200 mg by mouth 2 (two) times daily.     memantine 10 MG tablet  Commonly known as:  NAMENDA  Take 10 mg by mouth 2 (two) times daily.     risperiDONE 1 MG tablet  Commonly known as:  RISPERDAL  Take 1.5 mg by mouth 2 (two) times daily.       No Known Allergies    The results of significant diagnostics from this hospitalization (including imaging, microbiology, ancillary and laboratory) are listed below for reference.    Significant Diagnostic Studies: Dg Chest Portable 1 View  02/13/2013   *RADIOLOGY REPORT*  Clinical Data: Fatigue.  PORTABLE CHEST - 1 VIEW  Comparison: Chest x-ray dated 12/06/2010  Findings: Heart size and pulmonary vascularity are within normal limits.  Lungs are clear although the lungs are hyperinflated with flattening of the diaphragms suggesting emphysema.  Moderate thoracolumbar scoliosis.  Severe arthritis at both shoulders.  IMPRESSION: No acute abnormalities.   Original Report Authenticated By: Lorriane Shire, M.D.    Microbiology: No results found for this or any previous visit (from the past 240 hour(s)).   Labs: Basic Metabolic Panel:  Recent Labs Lab 02/13/13 1022 02/14/13 0521  NA 135 137  K 5.6* 5.0  CL 105 106  CO2 22 20  GLUCOSE 88 86  BUN 46* 36*  CREATININE 1.37* 0.98  CALCIUM 10.0 9.6   Liver Function Tests: No results found for this basename: AST, ALT, ALKPHOS, BILITOT, PROT, ALBUMIN,  in the last 168 hours No results found for this basename: LIPASE, AMYLASE,  in the last 168 hours No results found for this basename: AMMONIA,  in the last 168  hours CBC:  Recent Labs Lab 02/13/13 1022  WBC 6.3  NEUTROABS 4.0  HGB 8.4*  HCT 24.7*  MCV 98.4  PLT 204   Cardiac Enzymes:  Recent Labs Lab 02/13/13 1022  TROPONINI <0.30   BNP: BNP (last 3 results) No results found for this basename: PROBNP,  in the last 8760 hours CBG: No results found for this basename: GLUCAP,  in the last 168 hours     Signed:  Marisah Laker  [facility] 02/14/2013, 1:56 PM

## 2013-02-14 NOTE — Plan of Care (Signed)
Problem: Discharge Progression Outcomes Goal: Other Discharge Outcomes/Goals Outcome: Completed/Met Date Met:  02/14/13 Discharge to [facility] via their staff
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
## 2013-02-14 NOTE — Plan of Care (Signed)
Problem: Phase II Progression Outcomes Goal: Vital signs remain stable Outcome: Adequate for Discharge  BP elevated Pt being medicated PRN

## 2013-02-14 NOTE — Care Management Note (Signed)
    Page 1 of 1   02/14/2013     1:48:40 PM   CARE MANAGEMENT NOTE 02/14/2013  Patient:  MARSENA, BONDURANT   Account Number:  [id number]  Date Initiated:  02/14/2013  Documentation initiated by:  Theophilus Kinds  Subjective/Objective Assessment:   Pt admitted from [facility] with dehydration and hyperkalemia. Pt will return to facility at discharge.     Action/Plan:   CSW to arrange discharge to facility when medically stable.   Anticipated DC Date:  02/15/2013   Anticipated DC Plan:  ASSISTED LIVING / REST HOME  In-house referral  Clinical Social Worker      DC Planning Services  CM consult      Choice offered to / List presented to:             Status of service:  Completed, signed off Medicare Important Message given?  NA - LOS <3 / Initial given by admissions (If response is "NO", the following Medicare IM given date fields will be blank) Date Medicare IM given:   Date Additional Medicare IM given:    Discharge Disposition:  ASSISTED LIVING  Per UR Regulation:    If discussed at Long Length of Stay Meetings, dates discussed:    Comments:  02/14/13 [place], RN BSN CM Pt discharged back to [facility]. CSW to arrange discharge to facility.  02/14/13 [place], RN BSN CM

## 2013-02-14 NOTE — Plan of Care (Signed)
Problem: Phase III Progression Outcomes Goal: Voiding independently Outcome: Completed/Met Date Met:  02/14/13 incontinence  Problem: Discharge Progression Outcomes Goal: Discharge plan in place and appropriate Outcome: Completed/Met Date Met:  02/14/13 Pt returned to [facility] Goal: Pain controlled with appropriate interventions Outcome: Not Applicable Date Met:  0000000 Denies pain Goal: Complications resolved/controlled Outcome: Completed/Met Date Met:  02/14/13 BP controled with bp med prn

## 2013-03-13 ENCOUNTER — Inpatient Hospital Stay (HOSPITAL_COMMUNITY)
Admission: EM | Admit: 2013-03-13 | Discharge: 2013-03-19 | DRG: 193 | Disposition: A | Discharge status: Skilled Nursing Facility | Payer: Medicare Other | Attending: Internal Medicine | Admitting: Internal Medicine

## 2013-03-13 ENCOUNTER — Inpatient Hospital Stay (HOSPITAL_COMMUNITY): Payer: Medicare Other

## 2013-03-13 ENCOUNTER — Emergency Department (HOSPITAL_COMMUNITY): Payer: Medicare Other

## 2013-03-13 ENCOUNTER — Encounter (HOSPITAL_COMMUNITY): Payer: Self-pay | Admitting: *Deleted

## 2013-03-13 DIAGNOSIS — E876 Hypokalemia: Secondary | ICD-10-CM | POA: Diagnosis not present

## 2013-03-13 DIAGNOSIS — E872 Acidosis, unspecified: Secondary | ICD-10-CM | POA: Diagnosis present

## 2013-03-13 DIAGNOSIS — D638 Anemia in other chronic diseases classified elsewhere: Secondary | ICD-10-CM | POA: Diagnosis present

## 2013-03-13 DIAGNOSIS — Z681 Body mass index (BMI) 19 or less, adult: Secondary | ICD-10-CM

## 2013-03-13 DIAGNOSIS — Z66 Do not resuscitate: Secondary | ICD-10-CM | POA: Diagnosis present

## 2013-03-13 DIAGNOSIS — E875 Hyperkalemia: Secondary | ICD-10-CM

## 2013-03-13 DIAGNOSIS — F0390 Unspecified dementia without behavioral disturbance: Secondary | ICD-10-CM

## 2013-03-13 DIAGNOSIS — N179 Acute kidney failure, unspecified: Secondary | ICD-10-CM

## 2013-03-13 DIAGNOSIS — J189 Pneumonia, unspecified organism: Principal | ICD-10-CM

## 2013-03-13 DIAGNOSIS — I129 Hypertensive chronic kidney disease with stage 1 through stage 4 chronic kidney disease, or unspecified chronic kidney disease: Secondary | ICD-10-CM | POA: Diagnosis present

## 2013-03-13 DIAGNOSIS — Z515 Encounter for palliative care: Secondary | ICD-10-CM

## 2013-03-13 DIAGNOSIS — I10 Essential (primary) hypertension: Secondary | ICD-10-CM

## 2013-03-13 DIAGNOSIS — Z79899 Other long term (current) drug therapy: Secondary | ICD-10-CM

## 2013-03-13 DIAGNOSIS — D649 Anemia, unspecified: Secondary | ICD-10-CM

## 2013-03-13 DIAGNOSIS — E43 Unspecified severe protein-calorie malnutrition: Secondary | ICD-10-CM | POA: Diagnosis present

## 2013-03-13 DIAGNOSIS — E860 Dehydration: Secondary | ICD-10-CM | POA: Diagnosis present

## 2013-03-13 LAB — URINALYSIS, ROUTINE W REFLEX MICROSCOPIC
Bilirubin Urine: NEGATIVE
Ketones, ur: NEGATIVE mg/dL
Leukocytes, UA: NEGATIVE
Nitrite: NEGATIVE
Protein, ur: NEGATIVE mg/dL
pH: 5.5 (ref 5.0–8.0)

## 2013-03-13 LAB — TROPONIN I: Troponin I: <0.3 ng/mL

## 2013-03-13 LAB — COMPREHENSIVE METABOLIC PANEL
ALT: 10 U/L (ref 0–35)
BUN: 61 mg/dL — ABNORMAL HIGH (ref 6–23)
CO2: 20 mEq/L (ref 19–32)
Calcium: 10 mg/dL (ref 8.4–10.5)
Creatinine, Ser: 1.78 mg/dL — ABNORMAL HIGH (ref 0.50–1.10)
GFR calc Af Amer: 28 mL/min — ABNORMAL LOW (ref >90)
GFR calc non Af Amer: 24 mL/min — ABNORMAL LOW (ref >90)
Glucose, Bld: 101 mg/dL — ABNORMAL HIGH (ref 70–99)
Sodium: 133 mEq/L — ABNORMAL LOW (ref 135–145)
Total Protein: 7.7 g/dL (ref 6.0–8.3)

## 2013-03-13 LAB — CBC WITH DIFFERENTIAL/PLATELET
Eosinophils Absolute: 0.1 10*3/uL (ref 0.0–0.7)
Eosinophils Relative: 0 % (ref 0–5)
HCT: 27.9 % — ABNORMAL LOW (ref 36.0–46.0)
Lymphocytes Relative: 8 % — ABNORMAL LOW (ref 12–46)
Lymphs Abs: 1 10*3/uL (ref 0.7–4.0)
MCH: 32.5 pg (ref 26.0–34.0)
MCV: 95.5 fL (ref 78.0–100.0)
Monocytes Absolute: 1 10*3/uL (ref 0.1–1.0)
Monocytes Relative: 8 % (ref 3–12)
Platelets: 197 10*3/uL (ref 150–400)
RBC: 2.92 MIL/uL — ABNORMAL LOW (ref 3.87–5.11)
WBC: 12.1 10*3/uL — ABNORMAL HIGH (ref 4.0–10.5)

## 2013-03-13 LAB — LIPASE, BLOOD: Lipase: 31 U/L (ref 11–59)

## 2013-03-13 LAB — GLUCOSE, CAPILLARY: Glucose-Capillary: 94 mg/dL (ref 70–99)

## 2013-03-13 LAB — LACTIC ACID, PLASMA: Lactic Acid, Venous: 1.2 mmol/L (ref 0.5–2.2)

## 2013-03-13 MED ORDER — SODIUM CHLORIDE 0.9 % IV SOLN
INTRAVENOUS | Status: DC
  Administered 2013-03-14: 1 mL via INTRAVENOUS
  Administered 2013-03-14 – 2013-03-15 (×2): via INTRAVENOUS

## 2013-03-13 MED ORDER — ONDANSETRON HCL 4 MG/2ML IJ SOLN: 4.0000 mg | INTRAMUSCULAR | Status: DC | PRN

## 2013-03-13 MED ORDER — HYDRALAZINE HCL 20 MG/ML IJ SOLN
10.0000 mg | INTRAMUSCULAR | Status: DC | PRN
  Administered 2013-03-14 – 2013-03-15 (×2): 10 mg via INTRAVENOUS
  Filled 2013-03-13 (×2): qty 1

## 2013-03-13 MED ORDER — ENOXAPARIN SODIUM 30 MG/0.3ML ~~LOC~~ SOLN
30.0000 mg | SUBCUTANEOUS | Status: DC
  Administered 2013-03-14 – 2013-03-16 (×3): 30 mg via SUBCUTANEOUS
  Filled 2013-03-13 (×3): qty 0.3

## 2013-03-13 MED ORDER — SODIUM POLYSTYRENE SULFONATE 15 GM/60ML PO SUSP
30.0000 g | Freq: Once | ORAL | Status: AC
  Administered 2013-03-13: 30 g via ORAL
  Filled 2013-03-13: qty 120

## 2013-03-13 MED ORDER — SODIUM CHLORIDE 0.9 % IV SOLN: INTRAVENOUS | Status: DC

## 2013-03-13 MED ORDER — DEXTROSE 5 % IV SOLN
1.0000 g | Freq: Once | INTRAVENOUS | Status: AC
  Administered 2013-03-14: 1 g via INTRAVENOUS
  Filled 2013-03-13: qty 1

## 2013-03-13 MED ORDER — SODIUM CHLORIDE 0.9 % IV BOLUS (SEPSIS)
1000.0000 mL | Freq: Once | INTRAVENOUS | Status: AC
  Administered 2013-03-13 (×2): 1000 mL via INTRAVENOUS

## 2013-03-13 MED ORDER — DEXTROSE 5 % IV SOLN
INTRAVENOUS | Status: AC
  Filled 2013-03-13: qty 1

## 2013-03-13 MED ORDER — VANCOMYCIN HCL 500 MG IV SOLR
500.0000 mg | Freq: Once | INTRAVENOUS | Status: AC
  Administered 2013-03-14: 500 mg via INTRAVENOUS
  Filled 2013-03-13: qty 500

## 2013-03-13 MED ORDER — HALOPERIDOL LACTATE 5 MG/ML IJ SOLN
1.0000 mg | Freq: Two times a day (BID) | INTRAMUSCULAR | Status: DC
  Administered 2013-03-14 – 2013-03-16 (×5): 1 mg via INTRAVENOUS
  Filled 2013-03-13 (×5): qty 1

## 2013-03-13 NOTE — ED Notes (Signed)
Pt voided incont. In bed, linens changed. And pt. Cleaned.

## 2013-03-13 NOTE — H&P (Signed)
[facility] History and Physical  Morgan [name]  [id number]  DOB: [birth year]   DOA: 03/13/2013   PCP:   [name], MD   Chief Complaint:  Increased lethargy for the past 2 days  HPI: Morgan [name] is a 79 y.o. female.   Elderly African American lady with dementia, who was discharged from this facility about 4 weeks ago after treatment for acute renal failure due to dehydration, is sent back to our emergency room by her nursing facility for increasing lethargy and decreased appetite for the past 2 days, and found in the emergency room to be again in acute renal failure.   Chest x-ray was also suggestive of pneumonia but there was no history of coughing. Patient was admitted by the hospitalist service.  During the interview patient is poorly responsive she appears markedly dehydrated and is coughing frequently during the examination.     Rewiew of Systems:  Unable to obtain due to patient's advanced dementia and poor responsiveness   Past Medical History  Diagnosis Date  . Hypertension   . Dementia   . Anxiety     Past Surgical History  Procedure Laterality Date  . Appendectomy    . Abdominal hysterectomy      Medications:  HOME MEDS: Prior to Admission medications   Medication Sig Start Date End Date Taking? Authorizing Provider  carboxymethylcellulose (REFRESH PLUS) 0.5 % SOLN Place 1 drop into both eyes 3 (three) times daily.   Yes Historical Provider, MD  cloNIDine (CATAPRES) 0.2 MG tablet Take 1 tablet (0.2 mg total) by mouth 3 (three) times daily. 02/14/13  Yes Kathie Dike, MD  furosemide (LASIX) 20 MG tablet Take 20 mg by mouth daily.   Yes Historical Provider, MD  labetalol (NORMODYNE) 200 MG tablet Take 200 mg by mouth 2 (two) times daily.   Yes Historical Provider, MD  megestrol (MEGACE) 40 MG/ML suspension Take 200 mg by mouth 2 (two) times daily.   Yes Historical Provider, MD  memantine (NAMENDA) 10 MG tablet Take 10 mg by mouth 2 (two) times daily.    Yes Historical Provider, MD  risperiDONE (RISPERDAL) 1 MG tablet Take 1.5 mg by mouth 2 (two) times daily.   Yes Historical Provider, MD  sulfamethoxazole-trimethoprim (BACTRIM DS,SEPTRA DS) 800-160 MG per tablet Take 1 tablet by mouth 2 (two) times daily.   Yes Historical Provider, MD     Allergies:  No Known Allergies  Social History:   reports that she has never smoked. She does not have any smokeless tobacco history on file. She reports that she does not drink alcohol or use illicit drugs.  Family History: No family history on file. Unable to obtain due to patient's mental status  Physical Exam: Filed Vitals:   03/13/13 1805 03/13/13 1821 03/13/13 2121 03/13/13 2130  BP: 182/86   191/90  Pulse: 76   82  Temp:  98 F (36.7 C)  97.8 F (36.6 C)  TempSrc:    Oral  Resp:    20  Weight:   38.3 kg (84 lb 7 oz)   SpO2:    100%   Blood pressure 191/90, pulse 82, temperature 97.8 F (36.6 C), temperature source Oral, resp. rate 20, weight 38.3 kg (84 lb 7 oz), SpO2 100.00%. Body mass index is 14.05 kg/(m^2).   GEN:  Lethargic, cachectic elderly African American lady reclining in bed coughing frequently; unable to be cooperative with exam PSYCH:  Lethargic, unable to assess orientation. HEENT: Mucous membranes pink, DRY,  and  anicteric;  Breasts:: Not examined CHEST WALL: No tenderness CHEST: Transmitted breath sounds HEART: Regular rate and rhythm; no murmurs rubs or gallops BACK: Moderate kyphosis; no CVA tenderness ABDOMEN: Obese, soft non-tender; no masses, no organomegaly, normal abdominal bowel sounds; no pannus; no intertriginous candida. Rectal Exam: Not done EXTREMITIES extensive age-appropriate arthropathy of the hands and knees; generalized muscle wasting, no edema. Genitalia: not examined PULSES: 2+ and symmetric SKIN: Normal hydration no rash or ulceration    Labs on Admission:  Basic Metabolic Panel:  Recent Labs Lab 03/13/13 1646  NA 133*  K 5.2*  CL  103  CO2 20  GLUCOSE 101*  BUN 61*  CREATININE 1.78*  CALCIUM 10.0   Liver Function Tests:  Recent Labs Lab 03/13/13 1646  AST 18  ALT 10  ALKPHOS 92  BILITOT 0.3  PROT 7.7  ALBUMIN 3.6    Recent Labs Lab 03/13/13 1646  LIPASE 31   No results found for this basename: AMMONIA,  in the last 168 hours CBC:  Recent Labs Lab 03/13/13 1646  WBC 12.1*  NEUTROABS 10.0*  HGB 9.5*  HCT 27.9*  MCV 95.5  PLT 197   Cardiac Enzymes:  Recent Labs Lab 03/13/13 1646  TROPONINI <0.30   BNP: No components found with this basename: POCBNP,  D-dimer: No components found with this basename: D-DIMER,  CBG: No results found for this basename: GLUCAP,  in the last 168 hours  Radiological Exams on Admission: Ct Head Wo Contrast  03/13/2013   *RADIOLOGY REPORT*  Clinical Data: Altered mental status with dehydration.  History of dementia and hypertension.  CT HEAD WITHOUT CONTRAST  Technique:  Contiguous axial images were obtained from the base of the skull through the vertex without contrast.  Comparison: Head CT 11/24/2012 and 12/06/2010.  Findings: There is no evidence of acute intra hemorrhage, mass lesion, brain edema or extra-axial fluid collection.  There is moderate atrophy with extensive confluent periventricular white matter disease bilaterally.  This appears slightly progressive.  No acute cortical based infarct or hydrocephalus is demonstrated.  The visualized paranasal sinuses, mastoid air cells and middle ears are clear.  The calvarium is intact.  IMPRESSION: Progressive atrophy and confluent periventricular white matter disease.  No acute findings demonstrated.   Original Report Authenticated By: Richardean Sale, M.D.   Dg Abd Acute W/chest  03/13/2013   *RADIOLOGY REPORT*  Clinical Data: Weakness.  ACUTE ABDOMEN SERIES (ABDOMEN 2 VIEW & CHEST 1 VIEW)  Comparison: 02/13/2013  Findings: There is a kyphoscoliosis deformity involving the thoracic spine.  There is associated  rotational artifact.  The heart size appears normal.  No pleural effusion identified. Retrocardiac opacity is identified in the left lung base, new from previous exam and is suspicious for infiltrate.  The bowel gas pattern is nonobstructed.  There are no dilated loops of small bowel or air-fluid levels.  Moderate to marked amount of stool is identified throughout the colon and rectum.  No free intraperitoneal air noted.  IMPRESSION:  1.  Retrocardiac opacity is concerning for infiltrate.  Correlate for any clinical signs or symptoms of pneumonia. 2.  Suspect constipation.   Original Report Authenticated By: Kerby Moors, M.D.    EKG: Independently reviewed. Sinus rhythm, voltage criteria for LVH   Assessment/Plan  Active Problems:   HCAP (healthcare-associated pneumonia)   Acute renal failure   Hyperkalemia   Normocytic anemia   Hypertension   Dementia  PLAN: IV fluid hydration and keep n.p.o. while lethargic  Healthcare associated pneumonia protocol  pending plain CT scan to confirm definitive pneumonia and justify prolonged antibiotic therapy  Monitor the serum potassium; no definitive therapy corrected with hydration  When necessary intravenous hydralazine for blood pressure while n.p.o.  Scheduled low dose intravenous Haldol, as a replacement for her oral Risperdal.  Other plans as per orders.  Code Status: Full code Family Communication: No family available Disposition Plan: Likely back to skilled nursing facility    Kelvis Berger Nocturnist [facility] Pager [phone]   03/13/2013, 11:02 PM

## 2013-03-13 NOTE — ED Notes (Signed)
Pt given pm meal tray fed self.

## 2013-03-13 NOTE — ED Notes (Signed)
Pt tolerated regular meal tray well. poa Henrene Dodge -[place] to see pt and left contact number  [phone].  She is also pt;s niece.

## 2013-03-13 NOTE — ED Notes (Signed)
Attempted to call report. Nurse in report. Will have to return call.

## 2013-03-13 NOTE — ED Notes (Signed)
Iv infiltrated when pt returned from xray department. Moved to right ac. Pt tolerated well. Unable to stand for orthostaric vs.

## 2013-03-13 NOTE — Progress Notes (Signed)
ANTIBIOTIC CONSULT NOTE-Preliminary  Pharmacy Consult for Vancomycin and Cefepime Indication: Pneumonia   No Known Allergies  Patient Measurements: Weight: 84 lb 7 oz (38.3 kg)   Vital Signs: Temp: 97.8 F (36.6 C) (07/29 2130) Temp src: Oral (07/29 2130) BP: 191/90 mmHg (07/29 2130) Pulse Rate: 82 (07/29 2130)  Labs:  Recent Labs  03/13/13 1646  WBC 12.1*  HGB 9.5*  PLT 197  CREATININE 1.78*    The CrCl is unknown because both a height and weight (above a minimum accepted value) are required for this calculation.  No results found for this basename: VANCOTROUGH, VANCOPEAK, VANCORANDOM, GENTTROUGH, GENTPEAK, GENTRANDOM, TOBRATROUGH, TOBRAPEAK, TOBRARND, AMIKACINPEAK, AMIKACINTROU, AMIKACIN,  in the last 72 hours   Microbiology: No results found for this or any previous visit (from the past 720 hour(s)).  Medical History: Past Medical History  Diagnosis Date  . Hypertension   . Dementia   . Anxiety     Medications:   Assessment: 79 yo female admitted from nursing home with altered mental status, dehydration, and acute renal failure. Xray shows opacity concerning for infiltrate. Pt is afebrile with leukocytosis. Empiric antibiotics for HCA pneumonia.   Goal of Therapy:  Vancomycin troughs 15-20 mcg/ml Eradication of infection   Plan:  Preliminary review of pertinent patient information completed.  Protocol will be initiated with a one-time doses of Vancomycin 500 mg IV and Cefepime 1 GM IV.  Forestine Na clinical pharmacist will complete review during morning rounds to assess patient and finalize treatment regimen.  Norberto Sorenson, [facility] 03/13/2013,11:56 PM

## 2013-03-13 NOTE — ED Provider Notes (Signed)
CSN: [id number]     Arrival date & time 03/13/13  28 History     First MD Initiated Contact with Patient 03/13/13 1622     Chief Complaint  Patient presents with  . decreased appetite   . Dehydration    HPI Comments: Level V caveat for dementia. Patient sent from her nursing facility with reported decreased appetite and intake over the past 2 days. She is sleepy but arousable oriented x2. She denies any pain. Denies any cough, abdominal pain, chest pain, nausea or vomiting.  The history is provided by the patient. The history is limited by the condition of the patient.    Past Medical History  Diagnosis Date  . Hypertension   . Dementia   . Anxiety    Past Surgical History  Procedure Laterality Date  . Appendectomy    . Abdominal hysterectomy     No family history on file. History  Substance Use Topics  . Smoking status: Never Smoker   . Smokeless tobacco: Not on file  . Alcohol Use: No   OB History   Grav Para Term Preterm Abortions TAB SAB Ect Mult Living                 Review of Systems  Unable to perform ROS: Dementia    Allergies  Review of patient's allergies indicates no known allergies.  Home Medications   Current Outpatient Rx  Name  Route  Sig  Dispense  Refill  . carboxymethylcellulose (REFRESH PLUS) 0.5 % SOLN   Both Eyes   Place 1 drop into both eyes 3 (three) times daily.         . cloNIDine (CATAPRES) 0.2 MG tablet   Oral   Take 1 tablet (0.2 mg total) by mouth 3 (three) times daily.   90 tablet   0   . furosemide (LASIX) 20 MG tablet   Oral   Take 20 mg by mouth daily.         Marland Kitchen labetalol (NORMODYNE) 200 MG tablet   Oral   Take 200 mg by mouth 2 (two) times daily.         . megestrol (MEGACE) 40 MG/ML suspension   Oral   Take 200 mg by mouth 2 (two) times daily.         . memantine (NAMENDA) 10 MG tablet   Oral   Take 10 mg by mouth 2 (two) times daily.         . risperiDONE (RISPERDAL) 1 MG tablet   Oral    Take 1.5 mg by mouth 2 (two) times daily.         Marland Kitchen sulfamethoxazole-trimethoprim (BACTRIM DS,SEPTRA DS) 800-160 MG per tablet   Oral   Take 1 tablet by mouth 2 (two) times daily.          BP 180/82  Pulse 74  Temp(Src) 97.7 F (36.5 C) (Oral)  Resp 20  Wt 98 lb (44.453 kg)  BMI 16.31 kg/m2  SpO2 95% Physical Exam  Constitutional: She appears well-developed and well-nourished.  Frail and cachectic. Dry mucous membranes  HENT:  Head: Normocephalic and atraumatic.  Mouth/Throat: Oropharynx is clear and moist. No oropharyngeal exudate.  Eyes: Conjunctivae and EOM are normal. Pupils are equal, round, and reactive to light.  Neck: Normal range of motion. Neck supple.  Cardiovascular: Normal rate, regular rhythm and normal heart sounds.   No murmur heard. Pulmonary/Chest: Effort normal and breath sounds normal. No respiratory distress.  Abdominal: Soft.  There is no tenderness. There is no rebound and no guarding.  Musculoskeletal: Normal range of motion. She exhibits no edema and no tenderness.  Neurological: She is alert. No cranial nerve deficit. She exhibits normal muscle tone. Coordination normal.  Skin: Skin is warm. No rash noted.    ED Course   Procedures (including critical care time)  5:58 PM-informed pt of plan to admit and pt is agreeable. Recheck transcribed for Dr. Wyvonnia Dusky, MD by Jenne Campus, ED scribe on 03/13/13 at 5:58 PM. 6:16 PM-Consult complete with Dr. Sarajane Jews, hospitalist. Patient case explained and discussed. Dr. Sarajane Jews agrees to admit patient for further evaluation and treatment to tele, team 1. Call ended at 6:18 PM. Consult transcribed for Dr. Wyvonnia Dusky, MD by Jenne Campus, ED scribe on 03/13/13 at 6:16 PM.  Labs Reviewed  CBC WITH DIFFERENTIAL - Abnormal; Notable for the following:    WBC 12.1 (*)    RBC 2.92 (*)    Hemoglobin 9.5 (*)    HCT 27.9 (*)    Neutrophils Relative % 83 (*)    Neutro Abs 10.0 (*)    Lymphocytes Relative 8 (*)     All other components within normal limits  COMPREHENSIVE METABOLIC PANEL - Abnormal; Notable for the following:    Sodium 133 (*)    Potassium 5.2 (*)    Glucose, Bld 101 (*)    BUN 61 (*)    Creatinine, Ser 1.78 (*)    GFR calc non Af Amer 24 (*)    GFR calc Af Amer 28 (*)    All other components within normal limits  LIPASE, BLOOD  LACTIC ACID, PLASMA  TROPONIN I  URINALYSIS, ROUTINE W REFLEX MICROSCOPIC   Ct Head Wo Contrast  03/13/2013   *RADIOLOGY REPORT*  Clinical Data: Altered mental status with dehydration.  History of dementia and hypertension.  CT HEAD WITHOUT CONTRAST  Technique:  Contiguous axial images were obtained from the base of the skull through the vertex without contrast.  Comparison: Head CT 11/24/2012 and 12/06/2010.  Findings: There is no evidence of acute intra hemorrhage, mass lesion, brain edema or extra-axial fluid collection.  There is moderate atrophy with extensive confluent periventricular white matter disease bilaterally.  This appears slightly progressive.  No acute cortical based infarct or hydrocephalus is demonstrated.  The visualized paranasal sinuses, mastoid air cells and middle ears are clear.  The calvarium is intact.  IMPRESSION: Progressive atrophy and confluent periventricular white matter disease.  No acute findings demonstrated.   Original Report Authenticated By: Richardean Sale, M.D.   Dg Abd Acute W/chest  03/13/2013   *RADIOLOGY REPORT*  Clinical Data: Weakness.  ACUTE ABDOMEN SERIES (ABDOMEN 2 VIEW & CHEST 1 VIEW)  Comparison: 02/13/2013  Findings: There is a kyphoscoliosis deformity involving the thoracic spine.  There is associated rotational artifact.  The heart size appears normal.  No pleural effusion identified. Retrocardiac opacity is identified in the left lung base, new from previous exam and is suspicious for infiltrate.  The bowel gas pattern is nonobstructed.  There are no dilated loops of small bowel or air-fluid levels.  Moderate to  marked amount of stool is identified throughout the colon and rectum.  No free intraperitoneal air noted.  IMPRESSION:  1.  Retrocardiac opacity is concerning for infiltrate.  Correlate for any clinical signs or symptoms of pneumonia. 2.  Suspect constipation.   Original Report Authenticated By: Kerby Moors, M.D.   No diagnosis found.  MDM  From nursing home with decreased appetite  not drinking fluids. Recent admission for renal failure and hyperkalemia.  Creatinine 1.7 from 0.9. Potassium 5.2 With peaked T waves on EKG. Questionable retrocardiac opacity on x-ray. Patient denies cough or shortness of breath. She does have a leukocytosis of 12.   Discussed with Dr. Sarajane Jews. Will hold antibiotics at this time. Patient be admitted for IV hydration, correction of her renal failure and hyperkalemia.   Date: 03/13/2013  Rate: 71  Rhythm: normal sinus rhythm  QRS Axis: normal  Intervals: normal  ST/T Wave abnormalities: nonspecific ST/T changes  Conduction Disutrbances:none  Narrative Interpretation: peaked T waves  Old EKG Reviewed: changes noted  I personally performed the services described in this documentation, which was scribed in my presence. The recorded information has been reviewed and is accurate.         Ezequiel Essex, MD 03/13/13 [phone]

## 2013-03-13 NOTE — ED Notes (Signed)
Resident at [place] facility - per staff - pt has had decreased appetite and not drinking fluids x 2 days.  Pt denies pain.  Reports being "tired."

## 2013-03-14 DIAGNOSIS — D649 Anemia, unspecified: Secondary | ICD-10-CM

## 2013-03-14 DIAGNOSIS — F0390 Unspecified dementia without behavioral disturbance: Secondary | ICD-10-CM

## 2013-03-14 LAB — BASIC METABOLIC PANEL
Calcium: 9.4 mg/dL (ref 8.4–10.5)
Chloride: 107 mEq/L (ref 96–112)
Creatinine, Ser: 1.2 mg/dL — ABNORMAL HIGH (ref 0.50–1.10)
GFR calc Af Amer: 45 mL/min — ABNORMAL LOW (ref >90)
GFR calc non Af Amer: 39 mL/min — ABNORMAL LOW (ref >90)

## 2013-03-14 LAB — GLUCOSE, CAPILLARY: Glucose-Capillary: 86 mg/dL (ref 70–99)

## 2013-03-14 LAB — CBC
Platelets: 196 10*3/uL (ref 150–400)
RDW: 13.1 % (ref 11.5–15.5)
WBC: 13.4 10*3/uL — ABNORMAL HIGH (ref 4.0–10.5)

## 2013-03-14 LAB — STREP PNEUMONIAE URINARY ANTIGEN: Strep Pneumo Urinary Antigen: NEGATIVE

## 2013-03-14 MED ORDER — ADULT MULTIVITAMIN W/MINERALS CH
1.0000 | ORAL_TABLET | Freq: Every day | ORAL | Status: DC
  Administered 2013-03-14 – 2013-03-16 (×3): 1 via ORAL
  Filled 2013-03-14 (×3): qty 1

## 2013-03-14 MED ORDER — VANCOMYCIN HCL 500 MG IV SOLR
INTRAVENOUS | Status: AC
  Filled 2013-03-14: qty 500

## 2013-03-14 MED ORDER — VANCOMYCIN HCL 500 MG IV SOLR
500.0000 mg | INTRAVENOUS | Status: DC
  Administered 2013-03-14: 500 mg via INTRAVENOUS
  Filled 2013-03-14: qty 500

## 2013-03-14 MED ORDER — DEXTROSE 5 % IV SOLN
INTRAVENOUS | Status: AC
  Filled 2013-03-14: qty 1

## 2013-03-14 MED ORDER — DEXTROSE 5 % IV SOLN
1.0000 g | INTRAVENOUS | Status: DC
  Administered 2013-03-14: 1 g via INTRAVENOUS
  Filled 2013-03-14: qty 1

## 2013-03-14 MED ORDER — BOOST / RESOURCE BREEZE PO LIQD
1.0000 | Freq: Three times a day (TID) | ORAL | Status: DC
  Administered 2013-03-14 – 2013-03-19 (×15): 1 via ORAL

## 2013-03-14 NOTE — Progress Notes (Signed)
Dr. Megan Salon made aware of pts BP of 191/90. No new orders. Pt stable and asymptomatic. Will continue to monitor.

## 2013-03-14 NOTE — Progress Notes (Signed)
UR chart review completed.  

## 2013-03-14 NOTE — Care Management Note (Addendum)
    Page 1 of 2   03/19/2013     12:07:17 PM   CARE MANAGEMENT NOTE 03/19/2013  Patient:  Morgan [name], Morgan [name]   Account Number:  [id number]  Date Initiated:  03/14/2013  Documentation initiated by:  Theophilus Kinds  Subjective/Objective Assessment:   Pt admitted from [facility] with renal failure and possible pneumonia. Pt will return to facility when medically stable.     Action/Plan:   CSW will arrange discharge to facility. CM will follow for any discharge planning needs.   Anticipated DC Date:  03/19/2013   Anticipated DC Plan:  ASSISTED LIVING / REST HOME  In-house referral  Clinical Social Worker      DC Planning Services  CM consult      PAC Choice  HOSPICE   Choice offered to / List presented to:  C-2 HC POA / Guardian           Status of service:  Completed, signed off Medicare Important Message given?  YES (If response is "NO", the following Medicare IM given date fields will be blank) Date Medicare IM given:  03/16/2013 Date Additional Medicare IM given:  03/19/2013  Discharge Disposition:  [place]  Per UR Regulation:    If discussed at Long Length of Stay Meetings, dates discussed:    Comments:  03/19/13 [place], RN BSN CM Pt discharged to [facility]. CSW to arrange discharge to facility.  03/16/13 [place], RN BSN CM Pt for SNF with Hospice. CSW faxing out pts information to [facility] and [place]. Weekend CSW to arrange discharge to facility. [facility] referral still active in case pts niece decides to take pt home with Hospice care.   03/16/13 [place], RN BSN CM Pt now Hospice appropriate. Pt cannot return to [facility] with Hospice so pt will discharge home with niece, Lamar Blinks in [place]. [facility] called with referral. Made them aware that pt will need hospital bed and gave them address of pts niece. Notified Dr. Roderic Palau that pt would need discharge summary  dictated today for Hospice to have. Pts niece aware that pt discharge is expected on 03/17/13 and pts niece wants MD to hold pt until Monday. Weekend staff aware of Hospice number to call when pt discharge written [phone].  03/14/13 [place], RN BSN CM

## 2013-03-14 NOTE — Clinical Social Work Note (Signed)
CSW spoke with pt's niece Mickel Baas. She reports she is pleased with [facility] and wants pt to return at d/c.   Benay Pike, [place]

## 2013-03-14 NOTE — Progress Notes (Signed)
ANTIBIOTIC CONSULT NOTE  Pharmacy Consult for Vancomycin and Cefepime Indication: Pneumonia   No Known Allergies  Patient Measurements: Height: 5\' 1"  (154.9 cm) Weight: 88 lb 10 oz (40.2 kg) IBW/kg (Calculated) : 47.8   Vital Signs: Temp: 98.2 F (36.8 C) (07/30 0626) Temp src: Oral (07/30 0626) BP: 175/77 mmHg (07/30 0626) Pulse Rate: 102 (07/30 0626)  Labs:  Recent Labs  03/13/13 1646 03/14/13 0445  WBC 12.1* 13.4*  HGB 9.5* 8.8*  PLT 197 196  CREATININE 1.78* 1.20*    Estimated Creatinine Clearance: 20.2 ml/min (by C-G formula based on Cr of 1.2).  No results found for this basename: VANCOTROUGH, Corlis Leak, VANCORANDOM, [place], GENTPEAK, GENTRANDOM, TOBRATROUGH, TOBRAPEAK, TOBRARND, AMIKACINPEAK, AMIKACINTROU, AMIKACIN,  in the last 72 hours   Microbiology: Recent Results (from the past 720 hour(s))  CULTURE, BLOOD (ROUTINE X 2)     Status: None   Collection Time    03/13/13 11:31 PM      Result Value Range Status   Specimen Description BLOOD LEFT HAND   Final   Special Requests BOTTLES DRAWN AEROBIC ONLY 4CC   Final   Culture PENDING   Incomplete   Report Status PENDING   Incomplete  CULTURE, BLOOD (ROUTINE X 2)     Status: None   Collection Time    03/13/13 11:31 PM      Result Value Range Status   Specimen Description BLOOD LEFT ANTECUBITAL   Final   Special Requests     Final   Value: BOTTLES DRAWN AEROBIC AND ANAEROBIC AEB 6CC ANA 5CC   Culture PENDING   Incomplete   Report Status PENDING   Incomplete    Medical History: Past Medical History  Diagnosis Date  . Hypertension   . Dementia   . Anxiety    Medications: Reviewed  Assessment: 79 yo female admitted from nursing home with altered mental status, dehydration, and acute renal failure. Xray shows opacity concerning for infiltrate. Pt is afebrile with leukocytosis. Empiric antibiotics for HCA pneumonia.  SCr improving.  Estimated Creatinine Clearance: 20.2 ml/min (by C-G formula based  on Cr of 1.2).  Goal of Therapy:  Vancomycin troughs 15-20 mcg/ml Eradication of infection  Plan:  Vancomycin 500mg  IV q24hrs Check trough at steady state Cefepime 1gm IV q24hrs Monitor labs, renal fxn, and cultures  Hart Robinsons A, RPH 03/14/2013,7:45 AM

## 2013-03-14 NOTE — Progress Notes (Signed)
UTA pts pneumoccocal/influenza immunization status. Pt is only oriented to self. Will continue to monitor.

## 2013-03-14 NOTE — Progress Notes (Signed)
[facility] PROGRESS NOTE  Morgan [name] [id number] DOB: 07/10/1921 DOA: 03/13/2013 PCP: [name], MD  Assessment/Plan: 1. HCAP. Started on broad spectrum antibiotics.  Continue current treatments. 2. Dehydration.  Receiving IV fluids 3. Acute renal failure due to #2.  Continue IV fluids and monitor urine output 4. Dementia 5. Normocytic anemia, likely due to chronic disease, no signs of bleeding. 6. Hyperkalemia, resolved with IV fluids. 7. Metabolic acidosis.  Likely due to dehydration and renal failure.  Will recheck in am.  Code Status: full code Family Communication: left message for niece [name] Disposition Plan: return to SNF on discharge   Consultants:  none  Procedures:  none  Antibiotics:  Vancomycin 7/29  Cefepime 7/29  HPI/Subjective: Knows she is in the hospital.  Denies any complaints.  Confused.  Objective: Filed Vitals:   03/13/13 2130 03/14/13 0200 03/14/13 0626 03/14/13 1006  BP: 191/90 169/76 175/77 145/55  Pulse: 82 98 102 93  Temp: 97.8 F (36.6 C) 98 F (36.7 C) 98.2 F (36.8 C) 99 F (37.2 C)  TempSrc: Oral Oral Oral Oral  Resp: 20 20 20 19   Height:      Weight:   40.2 kg (88 lb 10 oz)   SpO2: 100% 92% 97% 99%    Intake/Output Summary (Last 24 hours) at 03/14/13 1130 Last data filed at 03/14/13 0900  Gross per 24 hour  Intake      0 ml  Output    250 ml  Net   -250 ml   Filed Weights   03/13/13 1608 03/13/13 2121 03/14/13 0626  Weight: 44.453 kg (98 lb) 38.3 kg (84 lb 7 oz) 40.2 kg (88 lb 10 oz)    Exam:   General:  Awake, slow to respond, confused  Cardiovascular: s1, s2, rrr  Respiratory: crackles at right base  Abdomen: soft, nt, nd, bs+  Musculoskeletal: no pedal edema b/l   Data Reviewed: Basic Metabolic Panel:  Recent Labs Lab 03/13/13 1646 03/14/13 0445  NA 133* 136  K 5.2* 3.6  CL 103 107  CO2 20 17*  GLUCOSE 101* 89  BUN 61* 44*  CREATININE 1.78* 1.20*  CALCIUM 10.0 9.4    Liver Function Tests:  Recent Labs Lab 03/13/13 1646  AST 18  ALT 10  ALKPHOS 92  BILITOT 0.3  PROT 7.7  ALBUMIN 3.6    Recent Labs Lab 03/13/13 1646  LIPASE 31   No results found for this basename: AMMONIA,  in the last 168 hours CBC:  Recent Labs Lab 03/13/13 1646 03/14/13 0445  WBC 12.1* 13.4*  NEUTROABS 10.0*  --   HGB 9.5* 8.8*  HCT 27.9* 25.4*  MCV 95.5 94.8  PLT 197 196   Cardiac Enzymes:  Recent Labs Lab 03/13/13 1646  TROPONINI <0.30   BNP (last 3 results)  Recent Labs  03/13/13 2331  PROBNP 912.4*   CBG:  Recent Labs Lab 03/13/13 2326 03/14/13 0707  GLUCAP 94 86    Recent Results (from the past 240 hour(s))  CULTURE, BLOOD (ROUTINE X 2)     Status: None   Collection Time    03/13/13 11:31 PM      Result Value Range Status   Specimen Description BLOOD LEFT HAND   Final   Special Requests BOTTLES DRAWN AEROBIC ONLY 4CC   Final   Culture NO GROWTH 1 DAY   Final   Report Status PENDING   Incomplete  CULTURE, BLOOD (ROUTINE X 2)     Status: None  Collection Time    03/13/13 11:31 PM      Result Value Range Status   Specimen Description BLOOD LEFT ANTECUBITAL   Final   Special Requests     Final   Value: BOTTLES DRAWN AEROBIC AND ANAEROBIC AEB=6CC ANA=5CC   Culture NO GROWTH 1 DAY   Final   Report Status PENDING   Incomplete     Studies: Ct Head Wo Contrast  03/13/2013   *RADIOLOGY REPORT*  Clinical Data: Altered mental status with dehydration.  History of dementia and hypertension.  CT HEAD WITHOUT CONTRAST  Technique:  Contiguous axial images were obtained from the base of the skull through the vertex without contrast.  Comparison: Head CT 11/24/2012 and 12/06/2010.  Findings: There is no evidence of acute intra hemorrhage, mass lesion, brain edema or extra-axial fluid collection.  There is moderate atrophy with extensive confluent periventricular white matter disease bilaterally.  This appears slightly progressive.  No acute  cortical based infarct or hydrocephalus is demonstrated.  The visualized paranasal sinuses, mastoid air cells and middle ears are clear.  The calvarium is intact.  IMPRESSION: Progressive atrophy and confluent periventricular white matter disease.  No acute findings demonstrated.   Original Report Authenticated By: Richardean Sale, M.D.   Ct Chest Wo Contrast  03/14/2013   *RADIOLOGY REPORT*  Clinical Data: Abnormal chest x-ray.  CT CHEST WITHOUT CONTRAST  Technique:  Multidetector CT imaging of the chest was performed following the standard protocol without IV contrast.  Comparison: 03/13/2013 radiograph.  Findings: Consolidation is present in the posterior left lower lobe, most compatible with pneumonia or less likely aspiration. Heart appears within normal limits. Coronary artery atherosclerosis is present. If office based assessment of coronary risk factors has not been performed, it is now recommended.  Right lung appears clear aside from scattered areas of atelectasis.  Aortic atherosclerosis.  Thoracic scoliosis is present.  Small left pleural effusion is probably a reactive parapneumonic effusion. Erosive changes of the glenohumeral joints bilaterally.  Mid thoracic compression fracture and lower thoracic cord compression fractures are present which are probably chronic although not well demonstrated on prior examinations.  Strictly speaking, these are age indeterminant.  IMPRESSION: 1.  Left lower lobe pneumonia. 2.  Small left parapneumonic effusion. 3.  Thoracic scoliosis and age indeterminate but likely chronic compression fractures.   Original Report Authenticated By: Dereck Ligas, M.D.   Dg Abd Acute W/chest  03/13/2013   *RADIOLOGY REPORT*  Clinical Data: Weakness.  ACUTE ABDOMEN SERIES (ABDOMEN 2 VIEW & CHEST 1 VIEW)  Comparison: 02/13/2013  Findings: There is a kyphoscoliosis deformity involving the thoracic spine.  There is associated rotational artifact.  The heart size appears normal.  No  pleural effusion identified. Retrocardiac opacity is identified in the left lung base, new from previous exam and is suspicious for infiltrate.  The bowel gas pattern is nonobstructed.  There are no dilated loops of small bowel or air-fluid levels.  Moderate to marked amount of stool is identified throughout the colon and rectum.  No free intraperitoneal air noted.  IMPRESSION:  1.  Retrocardiac opacity is concerning for infiltrate.  Correlate for any clinical signs or symptoms of pneumonia. 2.  Suspect constipation.   Original Report Authenticated By: Kerby Moors, M.D.    Scheduled Meds: . ceFEPime (MAXIPIME) IV  1 g Intravenous Q24H  . enoxaparin (LOVENOX) injection  30 mg Subcutaneous Q24H  . haloperidol lactate  1 mg Intravenous BID  . vancomycin  500 mg Intravenous Q24H   Continuous  Infusions: . sodium chloride 1 mL (03/14/13 0044)    Active Problems:   Acute renal failure   Hyperkalemia   Normocytic anemia   Hypertension   Dementia   HCAP (healthcare-associated pneumonia)    Time spent: 76mins    MEMON,JEHANZEB  [facility] Pager [phone]. If 7PM-7AM, please contact night-coverage at [email or web address], password [facility] 03/14/2013, 11:30 AM  LOS: 1 day

## 2013-03-14 NOTE — Progress Notes (Signed)
INITIAL NUTRITION ASSESSMENT  DOCUMENTATION CODES Per approved criteria  -Severe malnutrition in the context of chronic illness   INTERVENTION:  Resource Breeze po TID, each supplement provides 250 kcal and 9 grams of protein.  Add milk with all meals when diet is advanced to full liquids  Daily MVI  NUTRITION DIAGNOSIS: Malnutrition related to inadequate oral intake as evidenced by recurrent dehydration 2x  within 30 days, 10# unplanned wt loss x 30 d and severe temporal wasting, fat depletion (biceps/triceps)  Goal: Pt to meet >/= 90% of their estimated nutrition needs   Monitor:  Diet advancement, po intake, labs and wt trends  Reason for Assessment: poor po intake  79 y.o. female  Admitting Dx: Acute renal failure, dehydration  ASSESSMENT: Pt is from [employer]. Hx of dementia. Presented with renal failure for the second time this month. Also possible pneumonia. Pt able to provide only limited nutrition hx.  She meets criteria for severe malnutrition the context of chronic illness given her 10#,10% unplanned wt loss x 30 days, severe depletion of fat and muscle loss.  Nutrition Focused Physical Exam:  Subcutaneous Fat:  Orbital Region: somewhat hollow look (mild-moderate malnutrition) Upper Arm Region: severe depletion Thoracic and Lumbar Region: n/a  Muscle:  Temple Region: hollowing, scooping (severe malnutrition) Clavicle Bone Region: protruding, prominent bone Clavicle and Acromion Bone Region:  Scapular Bone Region: n/a Dorsal Hand: slightly depressed Patellar Region: rounded Anterior Thigh Region: obviously thin (severe malnutrition) Posterior Calf Region: thin, minimal muscle  Edema: none noted   Height: Ht Readings from Last 1 Encounters:  03/13/13 5\' 1"  (1.549 m)    Weight: Wt Readings from Last 1 Encounters:  03/14/13 88 lb 10 oz (40.2 kg)    Ideal Body Weight: 105# (47.7 kg)  % Ideal Body Weight: 85%  Wt Readings from Last 10  Encounters:  03/14/13 88 lb 10 oz (40.2 kg)  02/13/13 98 lb (44.453 kg)  12/22/12 130 lb (58.968 kg)    Usual Body Weight: 98#  % Usual Body Weight: 90%  BMI:  Body mass index is 16.75 kg/(m^2).underweight  Estimated Nutritional Needs: Kcal: 1200-1400 Protein: 45-55 gr Fluid: 1400 ml/day  Skin: No issues noted  Diet Order: Clear Liquid  EDUCATION NEEDS: -Education not appropriate at this time   Intake/Output Summary (Last 24 hours) at 03/14/13 1206 Last data filed at 03/14/13 0900  Gross per 24 hour  Intake      0 ml  Output    250 ml  Net   -250 ml    Last BM: 03/13/13 abdomen distended  Labs:   Recent Labs Lab 03/13/13 1646 03/14/13 0445  NA 133* 136  K 5.2* 3.6  CL 103 107  CO2 20 17*  BUN 61* 44*  CREATININE 1.78* 1.20*  CALCIUM 10.0 9.4  GLUCOSE 101* 89    CBG (last 3)   Recent Labs  03/13/13 2326 03/14/13 0707  GLUCAP 94 86    Scheduled Meds: . ceFEPime (MAXIPIME) IV  1 g Intravenous Q24H  . enoxaparin (LOVENOX) injection  30 mg Subcutaneous Q24H  . haloperidol lactate  1 mg Intravenous BID  . vancomycin  500 mg Intravenous Q24H    Continuous Infusions: . sodium chloride 1 mL (03/14/13 0044)    Past Medical History  Diagnosis Date  . Hypertension   . Dementia   . Anxiety     Past Surgical History  Procedure Laterality Date  . Appendectomy    . Abdominal hysterectomy  Colman Cater MS,RD,LDN,CSG Office: [phone] Pager: [phone]

## 2013-03-14 NOTE — Clinical Social Work Psychosocial (Signed)
  Clinical Social Work Department BRIEF PSYCHOSOCIAL ASSESSMENT 03/14/2013  Patient:  Morgan [name], Morgan [name]     Account Number:  [id number]     Admit date:  03/13/2013  Clinical Social Worker:  Wyatt Haste  Date/Time:  03/14/2013 12:15 PM  Referred by:  CSW  Date Referred:  03/14/2013 Referred for  ALF Placement   Other Referral:   Interview type:  Other - See comment Other interview type:   facility    PSYCHOSOCIAL DATA Living Status:  FACILITY Admitted from facility:  [facility] Level of care:  Assisted Living Primary support name:  Mickel Baas Primary support relationship to patient:  FAMILY Degree of support available:   adequate    CURRENT CONCERNS Current Concerns  Post-Acute Placement   Other Concerns:    SOCIAL WORK ASSESSMENT / PLAN CSW spoke with Butch Penny at [facility] regarding pt. Pt oriented to self only. She is known to CSW from admission earlier this month. Pt has been resident at [facility] for several months, placed with DSS assistance. It was confirmed with DSS after last d/c that pt's niece, Mickel Baas was made legal guardian and there is no further DSS involvement. Mickel Baas is involved and supportive. Since last admission, pt has not been ambulating as much and is using a wheelchair more frequently. She requires assistance with most ADLs. No home health prior to admission. Okay to return per Butch Penny. CSW attempted to reach pt's niece, but left voicemail requesting return call.   Assessment/plan status:  Psychosocial Support/Ongoing Assessment of Needs Other assessment/ plan:   Information/referral to community resources:   [place]    PATIENT'S/FAMILY'S RESPONSE TO PLAN OF CARE: Pt unable to discuss plan of care. No contact with family yet. Awaiting return call. Facility agreeable to return when medically stable.      Benay Pike, [place]

## 2013-03-15 DIAGNOSIS — E43 Unspecified severe protein-calorie malnutrition: Secondary | ICD-10-CM | POA: Insufficient documentation

## 2013-03-15 LAB — CBC
HCT: 25.2 % — ABNORMAL LOW (ref 36.0–46.0)
Hemoglobin: 8.8 g/dL — ABNORMAL LOW (ref 12.0–15.0)
MCHC: 34.9 g/dL (ref 30.0–36.0)
RDW: 13.2 % (ref 11.5–15.5)
WBC: 11.5 10*3/uL — ABNORMAL HIGH (ref 4.0–10.5)

## 2013-03-15 LAB — BASIC METABOLIC PANEL
BUN: 26 mg/dL — ABNORMAL HIGH (ref 6–23)
Chloride: 112 mEq/L (ref 96–112)
GFR calc Af Amer: 64 mL/min — ABNORMAL LOW (ref >90)
GFR calc non Af Amer: 55 mL/min — ABNORMAL LOW (ref >90)
Potassium: 3.2 mEq/L — ABNORMAL LOW (ref 3.5–5.1)

## 2013-03-15 LAB — LEGIONELLA ANTIGEN, URINE

## 2013-03-15 MED ORDER — CLONIDINE HCL 0.2 MG PO TABS
0.2000 mg | ORAL_TABLET | Freq: Three times a day (TID) | ORAL | Status: DC
  Administered 2013-03-15 – 2013-03-18 (×9): 0.2 mg via ORAL
  Filled 2013-03-15 (×6): qty 1
  Filled 2013-03-15: qty 2
  Filled 2013-03-15 (×2): qty 1

## 2013-03-15 MED ORDER — LABETALOL HCL 200 MG PO TABS
200.0000 mg | ORAL_TABLET | Freq: Two times a day (BID) | ORAL | Status: DC
  Administered 2013-03-15 – 2013-03-19 (×9): 200 mg via ORAL
  Filled 2013-03-15 (×9): qty 1

## 2013-03-15 MED ORDER — SODIUM CHLORIDE 0.45 % IV SOLN
INTRAVENOUS | Status: DC
  Administered 2013-03-15 – 2013-03-16 (×2): via INTRAVENOUS
  Filled 2013-03-15 (×3): qty 1000

## 2013-03-15 MED ORDER — LEVOFLOXACIN IN D5W 750 MG/150ML IV SOLN
750.0000 mg | INTRAVENOUS | Status: DC
  Administered 2013-03-15: 750 mg via INTRAVENOUS
  Filled 2013-03-15 (×3): qty 150

## 2013-03-15 MED ORDER — LEVOFLOXACIN IN D5W 750 MG/150ML IV SOLN: 750.0000 mg | INTRAVENOUS | Status: DC

## 2013-03-15 NOTE — Progress Notes (Signed)
[facility] PROGRESS NOTE  Morgan [name] [id number] DOB: 1921-02-20 DOA: 03/13/2013 PCP: [name], MD  Assessment/Plan: 1. HCAP. Started on broad spectrum antibiotics.  Afebrile and leukocytosis improving.  Will de-escalate to Levaquin 2. Dehydration.  Receiving IV fluids 3. Acute renal failure due to #2.  Improving. Continue IV fluids and monitor urine output 4. Dementia 5. Normocytic anemia, likely due to chronic disease, no signs of bleeding. 6. Hypokalemia, replace 7. Metabolic acidosis.  Likely due to dehydration and renal failure.  Will recheck in am.  Code Status: DNR. Family Communication: discussed with niece [name] Disposition Plan: return to [facility] on discharge, possibly tomorrow.   Consultants:  none  Procedures:  none  Antibiotics:  Vancomycin 7/29 - 7/31  Cefepime 7/29 - 7/31  Levaquin 7/31  HPI/Subjective: Patient is awake, more talkative today. Denies any complaints.  Objective: Filed Vitals:   03/14/13 1817 03/14/13 2052 03/15/13 0154 03/15/13 0511  BP: 153/65 164/80 154/81 173/83  Pulse: 114 102 100 97  Temp: 98 F (36.7 C) 98.1 F (36.7 C) 98 F (36.7 C) 98.2 F (36.8 C)  TempSrc: Oral Oral Oral Oral  Resp: 18 18 18 20   Height:      Weight:    42.1 kg (92 lb 13 oz)  SpO2: 100% 98% 100% 98%    Intake/Output Summary (Last 24 hours) at 03/15/13 0959 Last data filed at 03/15/13 0600  Gross per 24 hour  Intake   3690 ml  Output    950 ml  Net   2740 ml   Filed Weights   03/13/13 2121 03/14/13 0626 03/15/13 0511  Weight: 38.3 kg (84 lb 7 oz) 40.2 kg (88 lb 10 oz) 42.1 kg (92 lb 13 oz)    Exam:   General:  Awake, slow to respond, confused  Cardiovascular: s1, s2, rrr  Respiratory: crackles at right base  Abdomen: soft, nt, nd, bs+  Musculoskeletal: no pedal edema b/l   Data Reviewed: Basic Metabolic Panel:  Recent Labs Lab 03/13/13 1646 03/14/13 0445 03/15/13 0455  NA 133* 136 140  K 5.2* 3.6  3.2*  CL 103 107 112  CO2 20 17* 17*  GLUCOSE 101* 89 88  BUN 61* 44* 26*  CREATININE 1.78* 1.20* 0.90  CALCIUM 10.0 9.4 9.3   Liver Function Tests:  Recent Labs Lab 03/13/13 1646  AST 18  ALT 10  ALKPHOS 92  BILITOT 0.3  PROT 7.7  ALBUMIN 3.6    Recent Labs Lab 03/13/13 1646  LIPASE 31   No results found for this basename: AMMONIA,  in the last 168 hours CBC:  Recent Labs Lab 03/13/13 1646 03/14/13 0445 03/15/13 0455  WBC 12.1* 13.4* 11.5*  NEUTROABS 10.0*  --   --   HGB 9.5* 8.8* 8.8*  HCT 27.9* 25.4* 25.2*  MCV 95.5 94.8 95.1  PLT 197 196 241   Cardiac Enzymes:  Recent Labs Lab 03/13/13 1646  TROPONINI <0.30   BNP (last 3 results)  Recent Labs  03/13/13 2331  PROBNP 912.4*   CBG:  Recent Labs Lab 03/13/13 2326 03/14/13 0707  GLUCAP 94 86    Recent Results (from the past 240 hour(s))  CULTURE, BLOOD (ROUTINE X 2)     Status: None   Collection Time    03/13/13 11:31 PM      Result Value Range Status   Specimen Description BLOOD LEFT HAND   Final   Special Requests BOTTLES DRAWN AEROBIC ONLY 4CC   Final  Culture NO GROWTH 1 DAY   Final   Report Status PENDING   Incomplete  CULTURE, BLOOD (ROUTINE X 2)     Status: None   Collection Time    03/13/13 11:31 PM      Result Value Range Status   Specimen Description BLOOD LEFT ANTECUBITAL   Final   Special Requests     Final   Value: BOTTLES DRAWN AEROBIC AND ANAEROBIC AEB=6CC ANA=5CC   Culture NO GROWTH 1 DAY   Final   Report Status PENDING   Incomplete     Studies: Ct Head Wo Contrast  03/13/2013   *RADIOLOGY REPORT*  Clinical Data: Altered mental status with dehydration.  History of dementia and hypertension.  CT HEAD WITHOUT CONTRAST  Technique:  Contiguous axial images were obtained from the base of the skull through the vertex without contrast.  Comparison: Head CT 11/24/2012 and 12/06/2010.  Findings: There is no evidence of acute intra hemorrhage, mass lesion, brain edema or  extra-axial fluid collection.  There is moderate atrophy with extensive confluent periventricular white matter disease bilaterally.  This appears slightly progressive.  No acute cortical based infarct or hydrocephalus is demonstrated.  The visualized paranasal sinuses, mastoid air cells and middle ears are clear.  The calvarium is intact.  IMPRESSION: Progressive atrophy and confluent periventricular white matter disease.  No acute findings demonstrated.   Original Report Authenticated By: Richardean Sale, M.D.   Ct Chest Wo Contrast  03/14/2013   *RADIOLOGY REPORT*  Clinical Data: Abnormal chest x-ray.  CT CHEST WITHOUT CONTRAST  Technique:  Multidetector CT imaging of the chest was performed following the standard protocol without IV contrast.  Comparison: 03/13/2013 radiograph.  Findings: Consolidation is present in the posterior left lower lobe, most compatible with pneumonia or less likely aspiration. Heart appears within normal limits. Coronary artery atherosclerosis is present. If office based assessment of coronary risk factors has not been performed, it is now recommended.  Right lung appears clear aside from scattered areas of atelectasis.  Aortic atherosclerosis.  Thoracic scoliosis is present.  Small left pleural effusion is probably a reactive parapneumonic effusion. Erosive changes of the glenohumeral joints bilaterally.  Mid thoracic compression fracture and lower thoracic cord compression fractures are present which are probably chronic although not well demonstrated on prior examinations.  Strictly speaking, these are age indeterminant.  IMPRESSION: 1.  Left lower lobe pneumonia. 2.  Small left parapneumonic effusion. 3.  Thoracic scoliosis and age indeterminate but likely chronic compression fractures.   Original Report Authenticated By: Dereck Ligas, M.D.   Dg Abd Acute W/chest  03/13/2013   *RADIOLOGY REPORT*  Clinical Data: Weakness.  ACUTE ABDOMEN SERIES (ABDOMEN 2 VIEW & CHEST 1 VIEW)   Comparison: 02/13/2013  Findings: There is a kyphoscoliosis deformity involving the thoracic spine.  There is associated rotational artifact.  The heart size appears normal.  No pleural effusion identified. Retrocardiac opacity is identified in the left lung base, new from previous exam and is suspicious for infiltrate.  The bowel gas pattern is nonobstructed.  There are no dilated loops of small bowel or air-fluid levels.  Moderate to marked amount of stool is identified throughout the colon and rectum.  No free intraperitoneal air noted.  IMPRESSION:  1.  Retrocardiac opacity is concerning for infiltrate.  Correlate for any clinical signs or symptoms of pneumonia. 2.  Suspect constipation.   Original Report Authenticated By: Kerby Moors, M.D.    Scheduled Meds: . ceFEPime (MAXIPIME) IV  1 g Intravenous Q24H  .  enoxaparin (LOVENOX) injection  30 mg Subcutaneous Q24H  . feeding supplement  1 Container Oral TID BM  . haloperidol lactate  1 mg Intravenous BID  . multivitamin with minerals  1 tablet Oral Daily  . vancomycin  500 mg Intravenous Q24H   Continuous Infusions: . sodium chloride 85 mL/hr at 03/15/13 [id number]    Active Problems:   Acute renal failure   Hyperkalemia   Normocytic anemia   Hypertension   Dementia   HCAP (healthcare-associated pneumonia)   Protein-calorie malnutrition, severe    Time spent: 96mins    Leda Belle[place]uille  [facility] Pager [phone]. If 7PM-7AM, please contact night-coverage at [email or web address], password [facility] 03/15/2013, 9:59 AM  LOS: 2 days

## 2013-03-15 NOTE — Progress Notes (Signed)
ANTIBIOTIC CONSULT NOTE  Pharmacy Consult for Renal dose adjustments Indication: Pneumonia   No Known Allergies  Patient Measurements: Height: 5\' 1"  (154.9 cm) Weight: 92 lb 13 oz (42.1 kg) IBW/kg (Calculated) : 47.8   Vital Signs: Temp: 98.2 F (36.8 C) (07/31 0511) Temp src: Oral (07/31 0511) BP: 173/83 mmHg (07/31 0511) Pulse Rate: 97 (07/31 0511)  Labs:  Recent Labs  03/13/13 1646 03/14/13 0445 03/15/13 0455  WBC 12.1* 13.4* 11.5*  HGB 9.5* 8.8* 8.8*  PLT 197 196 241  CREATININE 1.78* 1.20* 0.90    Estimated Creatinine Clearance: 28.2 ml/min (by C-G formula based on Cr of 0.9).  No results found for this basename: VANCOTROUGH, Corlis Leak, VANCORANDOM, [place], GENTPEAK, GENTRANDOM, TOBRATROUGH, TOBRAPEAK, TOBRARND, AMIKACINPEAK, AMIKACINTROU, AMIKACIN,  in the last 72 hours   Microbiology: Recent Results (from the past 720 hour(s))  CULTURE, BLOOD (ROUTINE X 2)     Status: None   Collection Time    03/13/13 11:31 PM      Result Value Range Status   Specimen Description BLOOD LEFT HAND   Final   Special Requests BOTTLES DRAWN AEROBIC ONLY 4CC   Final   Culture NO GROWTH 1 DAY   Final   Report Status PENDING   Incomplete  CULTURE, BLOOD (ROUTINE X 2)     Status: None   Collection Time    03/13/13 11:31 PM      Result Value Range Status   Specimen Description BLOOD LEFT ANTECUBITAL   Final   Special Requests     Final   Value: BOTTLES DRAWN AEROBIC AND ANAEROBIC AEB=6CC ANA=5CC   Culture NO GROWTH 1 DAY   Final   Report Status PENDING   Incomplete    Medical History: Past Medical History  Diagnosis Date  . Hypertension   . Dementia   . Anxiety    Medications: Reviewed  Assessment: 79 yo female admitted from nursing home with altered mental status, dehydration, and acute renal failure. Xray shows opacity concerning for infiltrate. Pt is clinically improved today & antibiotics were de-escalated to Levaquin IV.   Renal function as improved to  patient's baseline.   Levaquin 7/31>>  Cefepime 7/30>>7/31 Vancomycin 7/30>>7/31  Goal of Therapy:  Eradication of infection  Plan:  Decrease Levaquin 750mg  IV q48h Monitor labs, renal fxn, and cultures Duration of therapy per MD  Biagio Borg, [place] 03/15/2013,10:21 AM

## 2013-03-16 LAB — BASIC METABOLIC PANEL
BUN: 28 mg/dL — ABNORMAL HIGH (ref 6–23)
Chloride: 107 mEq/L (ref 96–112)
GFR calc Af Amer: 51 mL/min — ABNORMAL LOW (ref >90)
GFR calc non Af Amer: 44 mL/min — ABNORMAL LOW (ref >90)
Glucose, Bld: 98 mg/dL (ref 70–99)
Potassium: 4.2 mEq/L (ref 3.5–5.1)
Sodium: 136 mEq/L (ref 135–145)

## 2013-03-16 LAB — CBC
HCT: 25.6 % — ABNORMAL LOW (ref 36.0–46.0)
Hemoglobin: 8.9 g/dL — ABNORMAL LOW (ref 12.0–15.0)
RBC: 2.69 MIL/uL — ABNORMAL LOW (ref 3.87–5.11)

## 2013-03-16 MED ORDER — LEVOFLOXACIN 750 MG PO TABS
750.0000 mg | ORAL_TABLET | ORAL | Status: DC
  Administered 2013-03-16 – 2013-03-18 (×2): 750 mg via ORAL
  Filled 2013-03-16 (×2): qty 1

## 2013-03-16 NOTE — Progress Notes (Signed)
[facility] PROGRESS NOTE  Brette Piccini [id number] DOB: [birth year] DOA: 03/13/2013 PCP: [name], MD  Assessment/Plan: 1. HCAP. Started on broad spectrum antibiotics.  Afebrile and leukocytosis improving.  Will de-escalate to Levaquin 2. Dehydration.  Receiving IV fluids 3. Acute renal failure due to #2.  Improving. Continue IV fluids and monitor urine output 4. Dementia 5. Normocytic anemia, likely due to chronic disease, no signs of bleeding. 6. Hypokalemia, replace 7. Metabolic acidosis.  Likely due to dehydration and renal failure.  Improved 8. Decreased by mouth intake due to cognitive deficits from dementia. Patient died was changed to pured foods. She continues to not swallow any food and pockets in mouth. Her niece reports that she's been doing this for several months now. Review of records indicate that she has had more than one admission for dehydration and acute renal failure. Her family has noticed that she has significantly declined over the past few months. When she was initially admitted to the assisted-living facility, she was ambulating with a cane. She has now become wheelchair-bound/bed bound. She become increasingly weak and has had decreased by mouth intake. Her niece feels that the patient feels like she has given up. With her advanced dementia, decreased by mouth intake, I have recommended a more comfort approach. We will continue current medications, but in the event that the patient does decline, we will make the primary focus of her care directed towards her comfort. I have recommended hospice services to follow with the patient. The family is in agreement with this. Since there was difficulty managing her at home, they have requested placement at the nursing center. Social work has been helping arrange this transfer. Once appropriate arrangements have been made, she may be transferred to the nursing center to be followed by hospice services for comfort/end-of-life  care. I anticipate that she will continue to fail to thrive, will likely become dehydrated again due to her decreased by mouth intake. Supportive/comfort care would be most appropriate in this situation.  Code Status: DNR. Family Communication: discussed with niece [name] Disposition Plan: Discharge to skilled nursing facility with hospice services once bed is available.   Consultants:  none  Procedures:  none  Antibiotics:  Vancomycin 7/29 - 7/31  Cefepime 7/29 - 7/31  Levaquin 7/31  HPI/Subjective: Patient is awake. Denies any complaints. She was changed to pured diet yesterday to help with any swallowing issues. She continues to pocket her food in her mouth.  Objective: Filed Vitals:   03/15/13 2021 03/16/13 0450 03/16/13 1125 03/16/13 1400  BP: 128/62 137/74 147/72 138/70  Pulse: 90 81 79 79  Temp: 97.9 F (36.6 C) 97.9 F (36.6 C) 98.8 F (37.1 C) 98.6 F (37 C)  TempSrc: Oral Oral Oral Oral  Resp: 18 18 22 22   Height:      Weight:  43.1 kg (95 lb 0.3 oz)    SpO2: 98% 99% 98% 96%    Intake/Output Summary (Last 24 hours) at 03/16/13 1950 Last data filed at 03/16/13 0500  Gross per 24 hour  Intake 1508.75 ml  Output    200 ml  Net 1308.75 ml   Filed Weights   03/14/13 0626 03/15/13 0511 03/16/13 0450  Weight: 40.2 kg (88 lb 10 oz) 42.1 kg (92 lb 13 oz) 43.1 kg (95 lb 0.3 oz)    Exam:   General:  Awake, slow to respond, confused  Cardiovascular: s1, s2, rrr  Respiratory: crackles at right base  Abdomen: soft, nt, nd, bs+  Musculoskeletal: no pedal edema b/l   Data Reviewed: Basic Metabolic Panel:  Recent Labs Lab 03/13/13 1646 03/14/13 0445 03/15/13 0455 03/16/13 0458  NA 133* 136 140 136  K 5.2* 3.6 3.2* 4.2  CL 103 107 112 107  CO2 20 17* 17* 19  GLUCOSE 101* 89 88 98  BUN 61* 44* 26* 28*  CREATININE 1.78* 1.20* 0.90 1.09  CALCIUM 10.0 9.4 9.3 9.0   Liver Function Tests:  Recent Labs Lab 03/13/13 1646  AST 18   ALT 10  ALKPHOS 92  BILITOT 0.3  PROT 7.7  ALBUMIN 3.6    Recent Labs Lab 03/13/13 1646  LIPASE 31   No results found for this basename: AMMONIA,  in the last 168 hours CBC:  Recent Labs Lab 03/13/13 1646 03/14/13 0445 03/15/13 0455 03/16/13 0458  WBC 12.1* 13.4* 11.5* 9.6  NEUTROABS 10.0*  --   --   --   HGB 9.5* 8.8* 8.8* 8.9*  HCT 27.9* 25.4* 25.2* 25.6*  MCV 95.5 94.8 95.1 95.2  PLT 197 196 241 255   Cardiac Enzymes:  Recent Labs Lab 03/13/13 1646  TROPONINI <0.30   BNP (last 3 results)  Recent Labs  03/13/13 2331  PROBNP 912.4*   CBG:  Recent Labs Lab 03/13/13 2326 03/14/13 0707  GLUCAP 94 86    Recent Results (from the past 240 hour(s))  CULTURE, BLOOD (ROUTINE X 2)     Status: None   Collection Time    03/13/13 11:31 PM      Result Value Range Status   Specimen Description BLOOD LEFT HAND   Final   Special Requests BOTTLES DRAWN AEROBIC ONLY 4CC   Final   Culture NO GROWTH 3 DAYS   Final   Report Status PENDING   Incomplete  CULTURE, BLOOD (ROUTINE X 2)     Status: None   Collection Time    03/13/13 11:31 PM      Result Value Range Status   Specimen Description BLOOD LEFT ANTECUBITAL   Final   Special Requests     Final   Value: BOTTLES DRAWN AEROBIC AND ANAEROBIC AEB=6CC ANA=5CC   Culture NO GROWTH 3 DAYS   Final   Report Status PENDING   Incomplete     Studies: No results found.  Scheduled Meds: . cloNIDine  0.2 mg Oral TID  . enoxaparin (LOVENOX) injection  30 mg Subcutaneous Q24H  . feeding supplement  1 Container Oral TID BM  . haloperidol lactate  1 mg Intravenous BID  . labetalol  200 mg Oral BID  . levofloxacin  750 mg Oral Q48H  . multivitamin with minerals  1 tablet Oral Daily   Continuous Infusions:    Active Problems:   Acute renal failure   Hyperkalemia   Normocytic anemia   Hypertension   Dementia   HCAP (healthcare-associated pneumonia)   Protein-calorie malnutrition, severe    Time spent:  35mins    MEMON,JEHANZEB  [facility] Pager [phone]. If 7PM-7AM, please contact night-coverage at [email or web address], password [facility] 03/16/2013, 7:50 PM  LOS: 3 days

## 2013-03-16 NOTE — Clinical Social Work Note (Signed)
MD discussed goals of care with pt's niece, Mickel Baas. She has decided to pursue hospice. CSW discussed with Tammy at [facility] who reports they are unable to have pt return with hospice. Mickel Baas plans to take pt home with hospice care. RN notified and making hospice referral. CSW will sign off, but can be reconsulted if needed.   Benay Pike, [place]

## 2013-03-16 NOTE — Clinical Social Work Placement (Signed)
Clinical Social Work Department CLINICAL SOCIAL WORK PLACEMENT NOTE 03/16/2013  Patient:  Morgan [name], Morgan [name]  Account Number:  [id number] Admit date:  03/13/2013  Clinical Social Worker:  Benay Pike, LCSW  Date/time:  03/16/2013 04:15 PM  Clinical Social Work is seeking post-discharge placement for this patient at the following level of care:   [place]   (*CSW will update this form in Epic as items are completed)   03/16/2013  Patient/family provided with [place] Department of Clinical Social Work's list of facilities offering this level of care within the geographic area requested by the patient (or if unable, by the patient's family).  03/16/2013  Patient/family informed of their freedom to choose among providers that offer the needed level of care, that participate in Medicare, Medicaid or managed care program needed by the patient, have an available bed and are willing to accept the patient.  03/16/2013  Patient/family informed of [facility]' ownership interest in [facility], as well as of the fact that they are under no obligation to receive care at this facility.  PASARR submitted to EDS on 03/16/2013 PASARR number received from EDS on   FL2 transmitted to all facilities in geographic area requested by pt/family on  03/16/2013 FL2 transmitted to all facilities within larger geographic area on   Patient informed that his/her managed care company has contracts with or will negotiate with  certain facilities, including the following:     Patient/family informed of bed offers received:  03/16/2013 Patient chooses bed at [facility] Physician recommends and patient chooses bed at  [facility]  Patient to be transferred to [facility] on   Patient to be transferred to facility by   The following physician request were entered in Epic:   Additional Comments:  Benay Pike, [place]

## 2013-03-16 NOTE — Clinical Social Work Note (Signed)
Pt's niece feels that she will not be able to take pt home now. CSW discussed other options, and she requests CSW speak with [facility] and see if they can offer bed. [facility] able to take pt tomorrow and will bring pt in under insurance but then transfer to private pay when hospice services start. [place] state they will initiate hospice at facility. Pt's niece will contact pt's lawyer who handles finances in order for him to contact [facility]. CSW initiated pasarr screening and awaiting number for transfer to SNF.  Benay Pike, [place]

## 2013-03-16 NOTE — Progress Notes (Signed)
Pitting edema noted to bilateral hands.  Niece at bedside concerned about this.  Niece reassured that this is common when patient's are receiving large amounts of IVF and are not moving about as they would normally be.  Niece also made aware that IVF had been stopped this morning per MD order.  IV noted to be leaking around the site of insertion.  Dressing removed and flushed with 10 cc's of saline.  Leaking still noted.  24 g IV removed from right posterior forearm.  Pt has strong radial pulses. Niece made aware of this.  Bilateral arms elevated on pillows.  Dr. Roderic Palau paged and made aware of niece's request to speak with Dr. Roderic Palau in person.  Dr. Roderic Palau returned page stating he was out of the hospital at this time, but would speak with niece once he had returned.  Niece made aware.  Niece verbalized understanding.

## 2013-03-16 NOTE — Progress Notes (Signed)
Dr. Roderic Palau paged regarding IV site removal.  Asked if okay to leave out at this time.  Awaiting return page at this time.

## 2013-03-17 MED ORDER — RISPERIDONE 1 MG PO TABS
1.5000 mg | ORAL_TABLET | Freq: Two times a day (BID) | ORAL | Status: DC
  Administered 2013-03-17 – 2013-03-19 (×5): 1.5 mg via ORAL
  Filled 2013-03-17 (×5): qty 1

## 2013-03-17 NOTE — Progress Notes (Signed)
Paged MD regarding need of foley catheter, planning to discharge to the hospice house, will leave in for end of life comfort.

## 2013-03-17 NOTE — Progress Notes (Signed)
[facility] PROGRESS NOTE  Morgan [name] [id number] DOB: [birth year] DOA: 03/13/2013 PCP: Morgan Burly, MD  Assessment/Plan: 1. HCAP. Afebrile and leukocytosis resolved.  On levaquin, antibiotic day #5.  Will do total of 7 days. 2. Dehydration.  Improved with IV fluids 3. Acute renal failure due to #2.  Improving. Resolved with iv fluids 4. Dementia 5. Normocytic anemia, likely due to chronic disease, no signs of bleeding. 6. Hypokalemia, replaced 7. Metabolic acidosis.  Likely due to dehydration and renal failure.  Improved 8. Decreased by mouth intake due to cognitive deficits from dementia. Patient died was changed to pured foods. She continues to not swallow any food and pockets in mouth. Her niece reports that she's been doing this for several months now. Review of records indicate that she has had more than one admission for dehydration and acute renal failure. Her family has noticed that she has significantly declined over the past few months. When she was initially admitted to the assisted-living facility, she was ambulating with a cane. She has now become wheelchair-bound/bed bound. She become increasingly weak and has had decreased by mouth intake. Her niece feels that the patient feels like she has given up. With her advanced dementia, decreased by mouth intake, I have recommended a more comfort approach. We will continue current medications, but in the event that the patient does decline, we will make the primary focus of her care directed towards her comfort. I have recommended hospice services to follow with the patient. The family is in agreement with this. Since there was difficulty managing her at home, they have requested placement at the nursing center. Social work has been helping arrange this transfer. Once appropriate arrangements have been made, she may be transferred to the nursing center to be followed by hospice services for comfort/end-of-life care. I anticipate that  she will continue to fail to thrive, will likely become dehydrated again due to her decreased by mouth intake. Supportive/comfort care would be most appropriate in this situation.  Code Status: DNR. Family Communication: discussed with niece Morgan [name] Disposition Plan: Discharge to skilled nursing facility with hospice services once bed is available.   Consultants:  none  Procedures:  none  Antibiotics:  Vancomycin 7/29 - 7/31  Cefepime 7/29 - 7/31  Levaquin 7/31  HPI/Subjective: Patient is sleeping, awakens to voice.  Appears weak. Denies any complaints.  Objective: Filed Vitals:   03/16/13 1400 03/16/13 2239 03/17/13 0246 03/17/13 0643  BP: 138/70 107/59 135/70 154/68  Pulse: 79 81 74 80  Temp: 98.6 F (37 C) 98.3 F (36.8 C) 98.1 F (36.7 C) 97.9 F (36.6 C)  TempSrc: Oral Oral Oral Oral  Resp: 22 18 20 20   Height:      Weight:    42.6 kg (93 lb 14.7 oz)  SpO2: 96% 99% 99% 100%    Intake/Output Summary (Last 24 hours) at 03/17/13 1217 Last data filed at 03/17/13 0659  Gross per 24 hour  Intake      0 ml  Output    550 ml  Net   -550 ml   Filed Weights   03/15/13 0511 03/16/13 0450 03/17/13 0643  Weight: 42.1 kg (92 lb 13 oz) 43.1 kg (95 lb 0.3 oz) 42.6 kg (93 lb 14.7 oz)    Exam:   General:  Awake, slow to respond, confused, appears chronically ill  Cardiovascular: s1, s2, rrr  Respiratory: crackles at right base  Abdomen: soft, nt, nd, bs+  Musculoskeletal: no pedal edema b/l  Data Reviewed: Basic Metabolic Panel:  Recent Labs Lab 03/13/13 1646 03/14/13 0445 03/15/13 0455 03/16/13 0458  NA 133* 136 140 136  K 5.2* 3.6 3.2* 4.2  CL 103 107 112 107  CO2 20 17* 17* 19  GLUCOSE 101* 89 88 98  BUN 61* 44* 26* 28*  CREATININE 1.78* 1.20* 0.90 1.09  CALCIUM 10.0 9.4 9.3 9.0   Liver Function Tests:  Recent Labs Lab 03/13/13 1646  AST 18  ALT 10  ALKPHOS 92  BILITOT 0.3  PROT 7.7  ALBUMIN 3.6    Recent Labs Lab  03/13/13 1646  LIPASE 31   No results found for this basename: AMMONIA,  in the last 168 hours CBC:  Recent Labs Lab 03/13/13 1646 03/14/13 0445 03/15/13 0455 03/16/13 0458  WBC 12.1* 13.4* 11.5* 9.6  NEUTROABS 10.0*  --   --   --   HGB 9.5* 8.8* 8.8* 8.9*  HCT 27.9* 25.4* 25.2* 25.6*  MCV 95.5 94.8 95.1 95.2  PLT 197 196 241 255   Cardiac Enzymes:  Recent Labs Lab 03/13/13 1646  TROPONINI <0.30   BNP (last 3 results)  Recent Labs  03/13/13 2331  PROBNP 912.4*   CBG:  Recent Labs Lab 03/13/13 2326 03/14/13 0707  GLUCAP 94 86    Recent Results (from the past 240 hour(s))  CULTURE, BLOOD (ROUTINE X 2)     Status: None   Collection Time    03/13/13 11:31 PM      Result Value Range Status   Specimen Description BLOOD LEFT HAND   Final   Special Requests BOTTLES DRAWN AEROBIC ONLY 4CC   Final   Culture NO GROWTH 4 DAYS   Final   Report Status PENDING   Incomplete  CULTURE, BLOOD (ROUTINE X 2)     Status: None   Collection Time    03/13/13 11:31 PM      Result Value Range Status   Specimen Description BLOOD LEFT ANTECUBITAL   Final   Special Requests     Final   Value: BOTTLES DRAWN AEROBIC AND ANAEROBIC AEB=6CC ANA=5CC   Culture NO GROWTH 4 DAYS   Final   Report Status PENDING   Incomplete     Studies: No results found.  Scheduled Meds: . cloNIDine  0.2 mg Oral TID  . feeding supplement  1 Container Oral TID BM  . labetalol  200 mg Oral BID  . levofloxacin  750 mg Oral Q48H  . risperiDONE  1.5 mg Oral BID   Continuous Infusions:    Active Problems:   Acute renal failure   Hyperkalemia   Normocytic anemia   Hypertension   Dementia   HCAP (healthcare-associated pneumonia)   Protein-calorie malnutrition, severe    Time spent: 50mins    Morgan [name]  [facility] Pager [phone]. If 7PM-7AM, please contact night-coverage at [email or web address], password [facility] 03/17/2013, 12:17 PM  LOS: 4 days

## 2013-03-18 MED ORDER — HYDROCODONE-ACETAMINOPHEN 5-325 MG PO TABS
1.0000 | ORAL_TABLET | ORAL | Status: DC | PRN
  Administered 2013-03-18 (×3): 1 via ORAL
  Filled 2013-03-18 (×3): qty 1

## 2013-03-18 MED ORDER — CLONIDINE HCL 0.1 MG PO TABS
0.1000 mg | ORAL_TABLET | Freq: Two times a day (BID) | ORAL | Status: DC
  Filled 2013-03-18: qty 1

## 2013-03-18 NOTE — Progress Notes (Signed)
[facility] PROGRESS NOTE  Morgan [name] [id number] DOB: [birth year] DOA: 03/13/2013 PCP: [name], MD  Assessment/Plan: 1. HCAP. Afebrile and leukocytosis resolved.  On levaquin, antibiotic day #6.  Will do total of 7 days. 2. Dehydration.  Improved with IV fluids 3. Acute renal failure due to #2.   Resolved with iv fluids 4. Dementia 5. Normocytic anemia, likely due to chronic disease, no signs of bleeding. 6. Hypokalemia, replaced 7. Metabolic acidosis.  Likely due to dehydration and renal failure.  Improved 8. Decreased by mouth intake due to cognitive deficits from dementia. Patient died was changed to pured foods. She continues to not swallow any food and pockets in mouth. Her niece reports that she's been doing this for several months now. Review of records indicate that she has had more than one admission for dehydration and acute renal failure. Her family has noticed that she has significantly declined over the past few months. When she was initially admitted to the assisted-living facility, she was ambulating with a cane. She has now become wheelchair-bound/bed bound. She become increasingly weak and has had decreased by mouth intake. Her niece feels that the patient feels like she has given up. With her advanced dementia, decreased by mouth intake, I have recommended a more comfort approach. We will continue current medications, but in the event that the patient does decline, we will make the primary focus of her care directed towards her comfort. I have recommended hospice services to follow with the patient. The family is in agreement with this. Since there was difficulty managing her at home, they have requested placement at the nursing center. Social work has been helping arrange this transfer. Once appropriate arrangements have been made, she may be transferred to the nursing center to be followed by hospice services for comfort/end-of-life care. I anticipate that she will  continue to fail to thrive, will likely become dehydrated again due to her decreased by mouth intake. Supportive/comfort care would be most appropriate in this situation.  Code Status: DNR. Family Communication: discussed with niece [name] Disposition Plan: Discharge to skilled nursing facility with hospice services once bed is available. Anticipate that she will be able to discharge tomorrow.   Consultants:  none  Procedures:  none  Antibiotics:  Vancomycin 7/29 - 7/31  Cefepime 7/29 - 7/31  Levaquin 7/31  HPI/Subjective: Sleeping on arrival.  Appears to be lethargic.  Only says a few words and falls back asleep.  Objective: Filed Vitals:   03/17/13 2243 03/18/13 0159 03/18/13 0551 03/18/13 1036  BP:  150/66 153/67 101/61  Pulse: 72 69 58 72  Temp: 98.1 F (36.7 C) 97.9 F (36.6 C) 97.5 F (36.4 C) 98.3 F (36.8 C)  TempSrc: Oral Oral Oral Oral  Resp: 20 20 20 20   Height:      Weight:      SpO2: 100% 100% 100% 97%    Intake/Output Summary (Last 24 hours) at 03/18/13 1123 Last data filed at 03/18/13 0551  Gross per 24 hour  Intake      0 ml  Output    700 ml  Net   -700 ml   Filed Weights   03/15/13 0511 03/16/13 0450 03/17/13 0643  Weight: 42.1 kg (92 lb 13 oz) 43.1 kg (95 lb 0.3 oz) 42.6 kg (93 lb 14.7 oz)    Exam:   General: patient is very lethargic, only says a few words to voice  Cardiovascular: s1, s2, rrr  Respiratory: crackles at right base  Abdomen: soft, nt, nd, bs+  Musculoskeletal: no pedal edema b/l   Data Reviewed: Basic Metabolic Panel:  Recent Labs Lab 03/13/13 1646 03/14/13 0445 03/15/13 0455 03/16/13 0458  NA 133* 136 140 136  K 5.2* 3.6 3.2* 4.2  CL 103 107 112 107  CO2 20 17* 17* 19  GLUCOSE 101* 89 88 98  BUN 61* 44* 26* 28*  CREATININE 1.78* 1.20* 0.90 1.09  CALCIUM 10.0 9.4 9.3 9.0   Liver Function Tests:  Recent Labs Lab 03/13/13 1646  AST 18  ALT 10  ALKPHOS 92  BILITOT 0.3  PROT 7.7   ALBUMIN 3.6    Recent Labs Lab 03/13/13 1646  LIPASE 31   No results found for this basename: AMMONIA,  in the last 168 hours CBC:  Recent Labs Lab 03/13/13 1646 03/14/13 0445 03/15/13 0455 03/16/13 0458  WBC 12.1* 13.4* 11.5* 9.6  NEUTROABS 10.0*  --   --   --   HGB 9.5* 8.8* 8.8* 8.9*  HCT 27.9* 25.4* 25.2* 25.6*  MCV 95.5 94.8 95.1 95.2  PLT 197 196 241 255   Cardiac Enzymes:  Recent Labs Lab 03/13/13 1646  TROPONINI <0.30   BNP (last 3 results)  Recent Labs  03/13/13 2331  PROBNP 912.4*   CBG:  Recent Labs Lab 03/13/13 2326 03/14/13 0707  GLUCAP 94 86    Recent Results (from the past 240 hour(s))  CULTURE, BLOOD (ROUTINE X 2)     Status: None   Collection Time    03/13/13 11:31 PM      Result Value Range Status   Specimen Description BLOOD LEFT HAND   Final   Special Requests BOTTLES DRAWN AEROBIC ONLY 4CC   Final   Culture NO GROWTH 4 DAYS   Final   Report Status PENDING   Incomplete  CULTURE, BLOOD (ROUTINE X 2)     Status: None   Collection Time    03/13/13 11:31 PM      Result Value Range Status   Specimen Description BLOOD LEFT ANTECUBITAL   Final   Special Requests     Final   Value: BOTTLES DRAWN AEROBIC AND ANAEROBIC AEB=6CC ANA=5CC   Culture NO GROWTH 4 DAYS   Final   Report Status PENDING   Incomplete     Studies: No results found.  Scheduled Meds: . cloNIDine  0.1 mg Oral BID  . feeding supplement  1 Container Oral TID BM  . labetalol  200 mg Oral BID  . levofloxacin  750 mg Oral Q48H  . risperiDONE  1.5 mg Oral BID   Continuous Infusions:    Active Problems:   Acute renal failure   Hyperkalemia   Normocytic anemia   Hypertension   Dementia   HCAP (healthcare-associated pneumonia)   Protein-calorie malnutrition, severe    Time spent: 19mins    Morgan [name]  [facility] Pager [phone]. If 7PM-7AM, please contact night-coverage at [email or web address], password [facility] 03/18/2013, 11:23 AM  LOS: 5 days

## 2013-03-18 NOTE — Progress Notes (Signed)
ANTIBIOTIC CONSULT NOTE  Pharmacy Consult for Renal dose adjustments Indication: Pneumonia   No Known Allergies  Patient Measurements: Height: 5\' 1"  (154.9 cm) Weight: 93 lb 14.7 oz (42.6 kg) IBW/kg (Calculated) : 47.8   Vital Signs: Temp: 97.5 F (36.4 C) (08/03 0551) Temp src: Oral (08/03 0551) BP: 153/67 mmHg (08/03 0551) Pulse Rate: 58 (08/03 0551)  Labs:  Recent Labs  03/16/13 0458  WBC 9.6  HGB 8.9*  PLT 255  CREATININE 1.09    Estimated Creatinine Clearance: 23.5 ml/min (by C-G formula based on Cr of 1.09).  No results found for this basename: VANCOTROUGH, Corlis Leak, VANCORANDOM, [place], GENTPEAK, GENTRANDOM, TOBRATROUGH, TOBRAPEAK, TOBRARND, AMIKACINPEAK, AMIKACINTROU, AMIKACIN,  in the last 72 hours   Microbiology: Recent Results (from the past 720 hour(s))  CULTURE, BLOOD (ROUTINE X 2)     Status: None   Collection Time    03/13/13 11:31 PM      Result Value Range Status   Specimen Description BLOOD LEFT HAND   Final   Special Requests BOTTLES DRAWN AEROBIC ONLY 4CC   Final   Culture NO GROWTH 4 DAYS   Final   Report Status PENDING   Incomplete  CULTURE, BLOOD (ROUTINE X 2)     Status: None   Collection Time    03/13/13 11:31 PM      Result Value Range Status   Specimen Description BLOOD LEFT ANTECUBITAL   Final   Special Requests     Final   Value: BOTTLES DRAWN AEROBIC AND ANAEROBIC AEB=6CC ANA=5CC   Culture NO GROWTH 4 DAYS   Final   Report Status PENDING   Incomplete    Medical History: Past Medical History  Diagnosis Date  . Hypertension   . Dementia   . Anxiety    Medications:  Medications Prior to Admission  Medication Sig Dispense Refill  . carboxymethylcellulose (REFRESH PLUS) 0.5 % SOLN Place 1 drop into both eyes 3 (three) times daily.      . cloNIDine (CATAPRES) 0.2 MG tablet Take 1 tablet (0.2 mg total) by mouth 3 (three) times daily.  90 tablet  0  . furosemide (LASIX) 20 MG tablet Take 20 mg by mouth daily.      Marland Kitchen  labetalol (NORMODYNE) 200 MG tablet Take 200 mg by mouth 2 (two) times daily.      . megestrol (MEGACE) 40 MG/ML suspension Take 200 mg by mouth 2 (two) times daily.      . memantine (NAMENDA) 10 MG tablet Take 10 mg by mouth 2 (two) times daily.      . risperiDONE (RISPERDAL) 1 MG tablet Take 1.5 mg by mouth 2 (two) times daily.      Marland Kitchen sulfamethoxazole-trimethoprim (BACTRIM DS,SEPTRA DS) 800-160 MG per tablet Take 1 tablet by mouth 2 (two) times daily.       Assessment: HCAP. Afebrile and leukocytosis resolved. On levaquin, antibiotic day #5. Will do total of 7 days per MD note. Estimated Creatinine Clearance: 23.5 ml/min (by C-G formula based on Cr of 1.09). Micro pending.  Levaquin 7/31>>  Cefepime 7/30>>7/31 Vancomycin 7/30>>7/31  Goal of Therapy:  Eradication of infection  Plan:  Continue Levaquin 750mg  PO q48h Monitor labs, renal fxn, and cultures Duration of therapy per MD  Pricilla Larsson, RPH 03/18/2013,9:45 AM

## 2013-03-19 ENCOUNTER — Other Ambulatory Visit: Payer: Self-pay | Admitting: *Deleted

## 2013-03-19 LAB — CULTURE, BLOOD (ROUTINE X 2)

## 2013-03-19 MED ORDER — LORAZEPAM 0.5 MG PO TABS: 0.5000 mg | ORAL_TABLET | Freq: Three times a day (TID) | ORAL | Status: DC | PRN

## 2013-03-19 MED ORDER — MORPHINE SULFATE (CONCENTRATE) 10 MG /0.5 ML PO SOLN: 10.0000 mg | ORAL | Status: DC | PRN

## 2013-03-19 MED ORDER — MORPHINE SULFATE 20 MG/5ML PO SOLN: ORAL | Status: DC

## 2013-03-19 NOTE — Clinical Social Work Placement (Signed)
Clinical Social Work Department CLINICAL SOCIAL WORK PLACEMENT NOTE 03/19/2013  Patient:  Morgan [name], Morgan [name]  Account Number:  [id number] Admit date:  03/13/2013  Clinical Social Worker:  Benay Pike, LCSW  Date/time:  03/16/2013 04:15 PM  Clinical Social Work is seeking post-discharge placement for this patient at the following level of care:   [place]   (*CSW will update this form in Epic as items are completed)   03/16/2013  Patient/family provided with [place] Department of Clinical Social Work's list of facilities offering this level of care within the geographic area requested by the patient (or if unable, by the patient's family).  03/16/2013  Patient/family informed of their freedom to choose among providers that offer the needed level of care, that participate in Medicare, Medicaid or managed care program needed by the patient, have an available bed and are willing to accept the patient.  03/16/2013  Patient/family informed of [facility]' ownership interest in [facility], as well as of the fact that they are under no obligation to receive care at this facility.  PASARR submitted to EDS on 03/16/2013 PASARR number received from EDS on 03/19/2013  FL2 transmitted to all facilities in geographic area requested by pt/family on  03/16/2013 FL2 transmitted to all facilities within larger geographic area on   Patient informed that his/her managed care company has contracts with or will negotiate with  certain facilities, including the following:     Patient/family informed of bed offers received:  03/16/2013 Patient chooses bed at [facility] Physician recommends and patient chooses bed at  [facility]  Patient to be transferred to [facility] on  03/19/2013 Patient to be transferred to facility by RN  The following physician request were entered in Epic:   Additional Comments:  Benay Pike, [place]

## 2013-03-19 NOTE — Progress Notes (Signed)
UR chart review completed.  

## 2013-03-19 NOTE — Discharge Summary (Signed)
Physician Discharge Summary  Morgan [name] [id number] DOB: Nov 06, 1920 DOA: 03/13/2013  PCP: Morgan Burly, MD  Admit date: 03/13/2013 Discharge date: 03/19/2013  Time spent: 35 minutes  Recommendations for Outpatient Follow-up:  1. Patient is being discharged to the [facility] with hospice services to follow for comfort care.  Discharge Diagnoses:  Active Problems:   Acute renal failure   Hyperkalemia   Normocytic anemia   Hypertension   Dementia   HCAP (healthcare-associated pneumonia)   Protein-calorie malnutrition, severe   Discharge Condition: stable  Diet recommendation: regular diet for comfort  Filed Weights   03/17/13 0643 03/18/13 0500 03/19/13 0452  Weight: 42.6 kg (93 lb 14.7 oz) 42.6 kg (93 lb 14.7 oz) 42.5 kg (93 lb 11.1 oz)    History of present illness:  Morgan [name] is a 79 y.o. female. Elderly African American lady with dementia, who was discharged from this facility about 4 weeks ago after treatment for acute renal failure due to dehydration, is sent back to our emergency room by her nursing facility for increasing lethargy and decreased appetite for the past 2 days, and found in the emergency room to be again in acute renal failure.  Chest x-ray was also suggestive of pneumonia but there was no history of coughing. Patient was admitted by the hospitalist service.  During the interview patient is poorly responsive she appears markedly dehydrated and is coughing frequently during the examination.   Hospital Course:  This is an 79 year old female with a history of advanced dementia who presented to the emergency room with increasing lethargy and cough. X-ray was suggestive of pneumonia. She was also dehydrated and found to be in acute renal failure. Patient was started on IV fluids and antibiotics. Her renal failure resolved with IV fluid resuscitation. Clinically, her pneumonia improved and she's completed 7 days of antibiotics. It was noted the patient was  not swallowing her food correctly. She was pocketing her food in her cheeks when she was fed. Her niece reports the patient has been doing this for several months now. This does pose an increased risk for aspiration. This was felt to be due to her advanced dementia and cognitive dysfunction. Her niece reports that patient has declined significantly over the last several months. When she was admitted to the assisted-living facility, she was walking with a cane. At this point, she's become wheelchair-bound/bedbound. The patient has become increasingly cachectic and has continued to lose weight. She's had repeated admissions for dehydration and acute renal failure. After extensive discussion with the patient's family, it was decided that she would need a higher level of care such as a skilled nursing facility. We also discussed alternative feeding options including feeding tubes. It was agreed that placing a gastrostomy tube for feeding would not be in the patient's benefit in the long term and that complication risks would be high. With her declining status, advanced dementia, multiple medical problems and recurrent admissions, comfort/hospice care was recommended. Her family is in agreement with this. She will be discharged to a skilled nursing facility today with hospice services to follow. At this point, primary focus should be given to her comfort and towards her end-of-life care.   Procedures:  none  Consultations:  none  Discharge Exam: Filed Vitals:   03/19/13 0015 03/19/13 0452 03/19/13 0556 03/19/13 0927  BP: 127/66  99/50 109/69  Pulse: 101  108 111  Temp:   100.7 F (38.2 C)   TempSrc:   Oral   Resp: 18  18   Height:      Weight:  42.5 kg (93 lb 11.1 oz)    SpO2:   98%     General: NAD, awake, denies any complaints Cardiovascular: s1, s2, rrr Respiratory: crackles at right base  Discharge Instructions  Discharge Orders   Future Orders Complete By Expires     Diet general  As  directed     Increase activity slowly  As directed         Medication List    STOP taking these medications       carboxymethylcellulose 0.5 % Soln  Commonly known as:  REFRESH PLUS     cloNIDine 0.2 MG tablet  Commonly known as:  CATAPRES     furosemide 20 MG tablet  Commonly known as:  LASIX     megestrol 40 MG/ML suspension  Commonly known as:  MEGACE     memantine 10 MG tablet  Commonly known as:  NAMENDA     sulfamethoxazole-trimethoprim 800-160 MG per tablet  Commonly known as:  BACTRIM DS,SEPTRA DS      TAKE these medications       labetalol 200 MG tablet  Commonly known as:  NORMODYNE  Take 200 mg by mouth 2 (two) times daily.     LORazepam 0.5 MG tablet  Commonly known as:  ATIVAN  Take 1 tablet (0.5 mg total) by mouth every 8 (eight) hours as needed for anxiety.     morphine CONCENTRATE 10 mg / 0.5 ml concentrated solution  Take 0.5 mLs (10 mg total) by mouth every 2 (two) hours as needed for pain.     risperiDONE 1 MG tablet  Commonly known as:  RISPERDAL  Take 1.5 mg by mouth 2 (two) times daily.       No Known Allergies    The results of significant diagnostics from this hospitalization (including imaging, microbiology, ancillary and laboratory) are listed below for reference.    Significant Diagnostic Studies: Ct Head Wo Contrast  03/13/2013   *RADIOLOGY REPORT*  Clinical Data: Altered mental status with dehydration.  History of dementia and hypertension.  CT HEAD WITHOUT CONTRAST  Technique:  Contiguous axial images were obtained from the base of the skull through the vertex without contrast.  Comparison: Head CT 11/24/2012 and 12/06/2010.  Findings: There is no evidence of acute intra hemorrhage, mass lesion, brain edema or extra-axial fluid collection.  There is moderate atrophy with extensive confluent periventricular white matter disease bilaterally.  This appears slightly progressive.  No acute cortical based infarct or hydrocephalus is  demonstrated.  The visualized paranasal sinuses, mastoid air cells and middle ears are clear.  The calvarium is intact.  IMPRESSION: Progressive atrophy and confluent periventricular white matter disease.  No acute findings demonstrated.   Original Report Authenticated By: Richardean Sale, M.D.   Ct Chest Wo Contrast  03/14/2013   *RADIOLOGY REPORT*  Clinical Data: Abnormal chest x-ray.  CT CHEST WITHOUT CONTRAST  Technique:  Multidetector CT imaging of the chest was performed following the standard protocol without IV contrast.  Comparison: 03/13/2013 radiograph.  Findings: Consolidation is present in the posterior left lower lobe, most compatible with pneumonia or less likely aspiration. Heart appears within normal limits. Coronary artery atherosclerosis is present. If office based assessment of coronary risk factors has not been performed, it is now recommended.  Right lung appears clear aside from scattered areas of atelectasis.  Aortic atherosclerosis.  Thoracic scoliosis is present.  Small left pleural effusion is probably a  reactive parapneumonic effusion. Erosive changes of the glenohumeral joints bilaterally.  Mid thoracic compression fracture and lower thoracic cord compression fractures are present which are probably chronic although not well demonstrated on prior examinations.  Strictly speaking, these are age indeterminant.  IMPRESSION: 1.  Left lower lobe pneumonia. 2.  Small left parapneumonic effusion. 3.  Thoracic scoliosis and age indeterminate but likely chronic compression fractures.   Original Report Authenticated By: Dereck Ligas, M.D.   Dg Abd Acute W/chest  03/13/2013   *RADIOLOGY REPORT*  Clinical Data: Weakness.  ACUTE ABDOMEN SERIES (ABDOMEN 2 VIEW & CHEST 1 VIEW)  Comparison: 02/13/2013  Findings: There is a kyphoscoliosis deformity involving the thoracic spine.  There is associated rotational artifact.  The heart size appears normal.  No pleural effusion identified. Retrocardiac  opacity is identified in the left lung base, new from previous exam and is suspicious for infiltrate.  The bowel gas pattern is nonobstructed.  There are no dilated loops of small bowel or air-fluid levels.  Moderate to marked amount of stool is identified throughout the colon and rectum.  No free intraperitoneal air noted.  IMPRESSION:  1.  Retrocardiac opacity is concerning for infiltrate.  Correlate for any clinical signs or symptoms of pneumonia. 2.  Suspect constipation.   Original Report Authenticated By: Kerby Moors, M.D.    Microbiology: Recent Results (from the past 240 hour(s))  CULTURE, BLOOD (ROUTINE X 2)     Status: None   Collection Time    03/13/13 11:31 PM      Result Value Range Status   Specimen Description BLOOD LEFT HAND   Final   Special Requests BOTTLES DRAWN AEROBIC ONLY 4CC   Final   Culture NO GROWTH 6 DAYS   Final   Report Status 03/19/2013 FINAL   Final  CULTURE, BLOOD (ROUTINE X 2)     Status: None   Collection Time    03/13/13 11:31 PM      Result Value Range Status   Specimen Description BLOOD LEFT ANTECUBITAL   Final   Special Requests     Final   Value: BOTTLES DRAWN AEROBIC AND ANAEROBIC AEB=6CC ANA=5CC   Culture NO GROWTH 6 DAYS   Final   Report Status 03/19/2013 FINAL   Final     Labs: Basic Metabolic Panel:  Recent Labs Lab 03/13/13 1646 03/14/13 0445 03/15/13 0455 03/16/13 0458  NA 133* 136 140 136  K 5.2* 3.6 3.2* 4.2  CL 103 107 112 107  CO2 20 17* 17* 19  GLUCOSE 101* 89 88 98  BUN 61* 44* 26* 28*  CREATININE 1.78* 1.20* 0.90 1.09  CALCIUM 10.0 9.4 9.3 9.0   Liver Function Tests:  Recent Labs Lab 03/13/13 1646  AST 18  ALT 10  ALKPHOS 92  BILITOT 0.3  PROT 7.7  ALBUMIN 3.6    Recent Labs Lab 03/13/13 1646  LIPASE 31   No results found for this basename: AMMONIA,  in the last 168 hours CBC:  Recent Labs Lab 03/13/13 1646 03/14/13 0445 03/15/13 0455 03/16/13 0458  WBC 12.1* 13.4* 11.5* 9.6  NEUTROABS 10.0*   --   --   --   HGB 9.5* 8.8* 8.8* 8.9*  HCT 27.9* 25.4* 25.2* 25.6*  MCV 95.5 94.8 95.1 95.2  PLT 197 196 241 255   Cardiac Enzymes:  Recent Labs Lab 03/13/13 1646  TROPONINI <0.30   BNP: BNP (last 3 results)  Recent Labs  03/13/13 2331  PROBNP 912.4*   CBG:  Recent Labs Lab 03/13/13 2326 03/14/13 0707  GLUCAP 94 86       Signed:  Ugochukwu Chichester  [facility] 03/19/2013, 11:53 AM

## 2013-03-19 NOTE — Clinical Social Work Note (Signed)
Pt d/c today to [facility]. [facility] number received. Pt's niece and facility aware and agreeable. Pt to transfer with RN and niece will complete paperwork this afternoon. D/C summary faxed. Out of facility DNR sent with pt.  Benay Pike, [place]

## 2013-03-19 NOTE — Progress Notes (Signed)
Report called to [facility]. Transporting patient over with tech.

## 2013-03-20 ENCOUNTER — Emergency Department (HOSPITAL_COMMUNITY): Payer: Medicare Other

## 2013-03-20 ENCOUNTER — Encounter (HOSPITAL_COMMUNITY): Payer: Self-pay

## 2013-03-20 ENCOUNTER — Non-Acute Institutional Stay (SKILLED_NURSING_FACILITY): Payer: Medicare Other | Admitting: Internal Medicine

## 2013-03-20 ENCOUNTER — Emergency Department (HOSPITAL_COMMUNITY)
Admission: EM | Admit: 2013-03-20 | Discharge: 2013-03-21 | Disposition: A | Discharge status: Home / Self Care | Payer: Medicare Other | Attending: Emergency Medicine | Admitting: Emergency Medicine

## 2013-03-20 DIAGNOSIS — R000 Tachycardia, unspecified: Secondary | ICD-10-CM

## 2013-03-20 DIAGNOSIS — F03918 Unspecified dementia, unspecified severity, with other behavioral disturbance: Secondary | ICD-10-CM

## 2013-03-20 DIAGNOSIS — I10 Essential (primary) hypertension: Secondary | ICD-10-CM

## 2013-03-20 DIAGNOSIS — F0390 Unspecified dementia without behavioral disturbance: Secondary | ICD-10-CM | POA: Insufficient documentation

## 2013-03-20 DIAGNOSIS — R5383 Other fatigue: Secondary | ICD-10-CM

## 2013-03-20 DIAGNOSIS — R404 Transient alteration of awareness: Secondary | ICD-10-CM | POA: Insufficient documentation

## 2013-03-20 DIAGNOSIS — F411 Generalized anxiety disorder: Secondary | ICD-10-CM | POA: Insufficient documentation

## 2013-03-20 DIAGNOSIS — E860 Dehydration: Secondary | ICD-10-CM

## 2013-03-20 DIAGNOSIS — R5381 Other malaise: Secondary | ICD-10-CM

## 2013-03-20 DIAGNOSIS — E43 Unspecified severe protein-calorie malnutrition: Secondary | ICD-10-CM

## 2013-03-20 DIAGNOSIS — Z79899 Other long term (current) drug therapy: Secondary | ICD-10-CM | POA: Insufficient documentation

## 2013-03-20 DIAGNOSIS — F0391 Unspecified dementia with behavioral disturbance: Secondary | ICD-10-CM

## 2013-03-20 LAB — CBC WITH DIFFERENTIAL/PLATELET
Eosinophils Absolute: 0.1 10*3/uL (ref 0.0–0.7)
Eosinophils Relative: 1 % (ref 0–5)
HCT: 27.8 % — ABNORMAL LOW (ref 36.0–46.0)
Lymphocytes Relative: 17 % (ref 12–46)
Lymphs Abs: 1.4 10*3/uL (ref 0.7–4.0)
MCH: 32.6 pg (ref 26.0–34.0)
MCV: 95.5 fL (ref 78.0–100.0)
Monocytes Absolute: 0.7 10*3/uL (ref 0.1–1.0)
Monocytes Relative: 8 % (ref 3–12)
RBC: 2.91 MIL/uL — ABNORMAL LOW (ref 3.87–5.11)
WBC: 8.3 10*3/uL (ref 4.0–10.5)

## 2013-03-20 LAB — URINALYSIS, ROUTINE W REFLEX MICROSCOPIC
Bilirubin Urine: NEGATIVE
Glucose, UA: NEGATIVE mg/dL
Ketones, ur: NEGATIVE mg/dL
Protein, ur: NEGATIVE mg/dL

## 2013-03-20 LAB — BASIC METABOLIC PANEL
BUN: 37 mg/dL — ABNORMAL HIGH (ref 6–23)
CO2: 20 mEq/L (ref 19–32)
Calcium: 9.8 mg/dL (ref 8.4–10.5)
Creatinine, Ser: 1.1 mg/dL (ref 0.50–1.10)
Glucose, Bld: 97 mg/dL (ref 70–99)

## 2013-03-20 LAB — URINE MICROSCOPIC-ADD ON

## 2013-03-20 MED ORDER — LORAZEPAM 1 MG PO TABS: 1.0000 mg | ORAL_TABLET | Freq: Once | ORAL | Status: DC

## 2013-03-20 MED ORDER — SODIUM CHLORIDE 0.9 % IV SOLN: INTRAVENOUS | Status: DC

## 2013-03-20 MED ORDER — SODIUM CHLORIDE 0.9 % IV BOLUS (SEPSIS)
500.0000 mL | Freq: Once | INTRAVENOUS | Status: AC
  Administered 2013-03-20: 500 mL via INTRAVENOUS

## 2013-03-20 NOTE — ED Notes (Signed)
Patient placed on continuous cardiac monitoring, continuous pulse 0x monitoring 

## 2013-03-20 NOTE — ED Notes (Signed)
Pt states she had pain in arm and right leg about 30 minutes ago, denies any pain at this time. Family came to bedside.

## 2013-03-20 NOTE — ED Notes (Signed)
Pt from [facility] with reportedly irreg heartrate and tachycardia. Pt is awake and alert, appears comfortable, denies pain at this time.  Pt responds to questions but does not answer with appropriate responses.

## 2013-03-20 NOTE — ED Notes (Signed)
Xray at the bedside.

## 2013-03-20 NOTE — ED Notes (Signed)
MD at the bedside  

## 2013-03-20 NOTE — ED Notes (Signed)
IV bolus complete, infusing slow, positional IV

## 2013-03-20 NOTE — ED Notes (Signed)
[facility] to advise of discharge, They will be here soon.

## 2013-03-20 NOTE — ED Provider Notes (Signed)
CSN: [id number]     Arrival date & time 03/20/13  2015 History  This chart was scribed for Richarda Blade, MD by Jenne Campus, ED Scribe. This patient was seen in room APA08/APA08 and the patient's care was started at 9:38 PM.   CC: tachycardia  Level 5 Caveat-Dementia  No language interpreter was used.   HPI Comments: Morgan [name] is a 79 y.o. female who presents to the Emergency Department from the [facility] for decreased responsiveness and tachycardia. Per transfer notes, the family is requesting aggressive treatment. Pt was discharged yesterday for renal failure and hypokalemia with hospice services for comfort care. Family states that they were contacted by [facility] PCP and were told that the pt felt "hot" and wanted a CXR to rule out PNA due to cough yesterday. Family denies being told about tachycardia or being given an exact temperature. Pt was more talkative yesterday compare to today per family. Pt has also been talking about "caskets, funerals and talking about my dad who has passed away". Family denies prior episodes of similar symptoms. Due to pt's h/o dementia, she is unable to give any further details.  Past Medical History  Diagnosis Date  . Hypertension   . Dementia   . Anxiety    Past Surgical History  Procedure Laterality Date  . Appendectomy    . Abdominal hysterectomy     No family history on file. History  Substance Use Topics  . Smoking status: Never Smoker   . Smokeless tobacco: Not on file  . Alcohol Use: No   No OB history provided.  Review of Systems  Unable to perform ROS: Dementia    Allergies  Review of patient's allergies indicates no known allergies.  Home Medications   Current Outpatient Rx  Name  Route  Sig  Dispense  Refill  . labetalol (NORMODYNE) 200 MG tablet   Oral   Take 200 mg by mouth 2 (two) times daily.         Marland Kitchen LORazepam (ATIVAN) 0.5 MG tablet   Oral   Take 1 tablet (0.5 mg total) by mouth every 8 (eight) hours as  needed for anxiety.   90 tablet   0   . risperiDONE (RISPERDAL) 1 MG tablet   Oral   Take 1.5 mg by mouth 2 (two) times daily.         . Morphine Sulfate (MORPHINE CONCENTRATE) 10 mg / 0.5 ml concentrated solution   Oral   Take 0.5 mLs (10 mg total) by mouth every 2 (two) hours as needed for pain.   120 mL   0     Physical Exam  Nursing note and vitals reviewed. Constitutional: She appears well-developed and well-nourished.  HENT:  Head: Normocephalic and atraumatic.  Right Ear: External ear normal.  Left Ear: External ear normal.  Dry lips and tongue  Eyes: Conjunctivae and EOM are normal. Pupils are equal, round, and reactive to light.  Neck: Normal range of motion and phonation normal. Neck supple.  Cardiovascular: Normal rate, regular rhythm, normal heart sounds and intact distal pulses.   Pulmonary/Chest: Effort normal and breath sounds normal. She exhibits no bony tenderness.  Abdominal: Soft. Normal appearance. There is no tenderness.  Musculoskeletal: Normal range of motion.  Neurological: She is alert. She has normal strength. No cranial nerve deficit or sensory deficit. She exhibits normal muscle tone. Coordination normal.  Skin: Skin is warm, dry and intact.  Psychiatric: She has a normal mood and affect. Her  behavior is normal. Judgment and thought content normal.    ED Course   Procedures (including critical care time)  Medications  0.9 %  sodium chloride infusion (not administered)  sodium chloride 0.9 % bolus 500 mL (0 mLs Intravenous Stopped 03/20/13 2302)    Patient Vitals for the past 24 hrs:  BP Temp Temp src Pulse Resp SpO2  03/20/13 2326 203/104 mmHg 98.4 F (36.9 C) Rectal 98 - 96 %  03/20/13 2313 211/89 mmHg - - 95 20 100 %  03/20/13 2115 159/78 mmHg - - 96 18 -  03/20/13 2030 141/75 mmHg - - 102 22 -  03/20/13 2026 129/81 mmHg 98.8 F (37.1 C) - 100 - 98 %    DIAGNOSTIC STUDIES: Oxygen Saturation is 98% on room air, normal by my  interpretation.    COORDINATION OF CARE: 9:42 PM-Discussed treatment plan which includes CXR, CBC panel, BMP and UA with pt's family at bedside and they agreed to plan.    Reevaluation with update and discussion. After initial assessment and treatment, an updated evaluation reveals the patient appears somewhat uncomfortable. I checked a manual blood pressures in the right arm. It was 170/95 and in the left arm. It was 165/95. These measurements were somewhat lower than those taken with the automated blood pressure, and listed above. Kalese Ensz L   Labs Reviewed  CBC WITH DIFFERENTIAL - Abnormal; Notable for the following:    RBC 2.91 (*)    Hemoglobin 9.5 (*)    HCT 27.8 (*)    All other components within normal limits  BASIC METABOLIC PANEL - Abnormal; Notable for the following:    Sodium 131 (*)    BUN 37 (*)    GFR calc non Af Amer 43 (*)    GFR calc Af Amer 50 (*)    All other components within normal limits  URINALYSIS, ROUTINE W REFLEX MICROSCOPIC - Abnormal; Notable for the following:    Hgb urine dipstick TRACE (*)    All other components within normal limits  URINE CULTURE  URINE MICROSCOPIC-ADD ON   Dg Chest Port 1 View  03/20/2013   *RADIOLOGY REPORT*  Clinical Data: Tachycardia, chest pain  PORTABLE CHEST - 1 VIEW  Comparison: 03/14/2013, 03/13/2013  Findings: Mild hyperinflation.  Heart is enlarged without superimposed CHF or edema.  Improvement in left lower lobe atelectasis / consolidation.  No developing effusion or pneumothorax.  Chronic scoliosis of the spine and advanced degenerative changes of the shoulders.  IMPRESSION: Cardiomegaly without CHF  Hyperinflation  Improving left lower lobe atelectasis / consolidation  No new process.   Original Report Authenticated By: Jerilynn Mages. Shick, M.D.   1. Dehydration     MDM  The patient's evaluation is consistent with mild dehydration evidenced by mildly elevated BUN from recent baseline. She has mild hypertension. She is treated  for, currently. Is no evidence for persistent pneumonia, pneumothorax, or suspected occult infection. She is stable for discharge with current management plan  Nursing Notes Reviewed/ Care Coordinated, and agree without changes. Applicable Imaging Reviewed.  Interpretation of Laboratory Data incorporated into ED treatment   Plan: Home Medications- usual; Home Treatments and Observation- rest, push fluids, watch for progressive symptoms; return here if the recommended treatment, does not improve the symptoms; Recommended follow up- PCP for check up in 2 days    I personally performed the services described in this documentation, which was scribed in my presence. The recorded information has been reviewed and is accurate.   Trayce Maino L  Eulis Foster, MD 03/21/13 [id number]

## 2013-03-20 NOTE — ED Notes (Signed)
Family at the bedside, stated that per MD at [facility] center, pt felt hot and had a cough yesterday,Sent to ED for chest xray.

## 2013-03-21 ENCOUNTER — Non-Acute Institutional Stay (SKILLED_NURSING_FACILITY): Payer: Medicare Other | Admitting: Internal Medicine

## 2013-03-21 ENCOUNTER — Inpatient Hospital Stay
Admission: RE | Admit: 2013-03-21 | Discharge: 2013-11-18 | Disposition: A | Discharge status: Other Acute Inpatient Hospital | Payer: Medicare Other | Source: Ambulatory Visit | Attending: Internal Medicine | Admitting: Internal Medicine

## 2013-03-21 DIAGNOSIS — E43 Unspecified severe protein-calorie malnutrition: Secondary | ICD-10-CM

## 2013-03-21 DIAGNOSIS — R000 Tachycardia, unspecified: Secondary | ICD-10-CM

## 2013-03-21 DIAGNOSIS — R627 Adult failure to thrive: Secondary | ICD-10-CM

## 2013-03-21 DIAGNOSIS — I10 Essential (primary) hypertension: Secondary | ICD-10-CM

## 2013-03-21 DIAGNOSIS — F0391 Unspecified dementia with behavioral disturbance: Secondary | ICD-10-CM

## 2013-03-21 NOTE — ED Notes (Signed)
[place] here for pt

## 2013-03-21 NOTE — ED Notes (Signed)
Patient given discharge instruction, verbalized understand. IV removed, band aid applied. Patient on stretcher out of the department.

## 2013-03-22 LAB — URINE CULTURE

## 2013-04-02 ENCOUNTER — Non-Acute Institutional Stay (SKILLED_NURSING_FACILITY): Payer: Medicare Other | Admitting: Internal Medicine

## 2013-04-02 DIAGNOSIS — N179 Acute kidney failure, unspecified: Secondary | ICD-10-CM

## 2013-04-02 DIAGNOSIS — F0280 Dementia in other diseases classified elsewhere without behavioral disturbance: Secondary | ICD-10-CM

## 2013-04-02 DIAGNOSIS — G219 Secondary parkinsonism, unspecified: Secondary | ICD-10-CM

## 2013-04-02 NOTE — Progress Notes (Signed)
Patient ID: Morgan [name], female   DOB: 01-Jan-1921, 79 y.o.   MRN: [id number] Facility; [facility] Chief complaint; review of medical issues including renal insufficiency, dementia History; this is an 79 year old lady who was hospitalized from July 29 through August the with a left lower pneumonia documented on CT scan, acute renal insufficiency with a creatinine of 1.78. She was given IV antibiotics and rehydrated. Discharge creatinine was 1.09. Apparently she had been in a nursing home prior to arrival here although I can't identify which facility. According to the staff she is eating 50-75% of her meals. Cannot see that she has had lab work done in the facility. Of note there was extensive discussion during the last hospitalization about comfort measures including hospice although the patient has a full CODE STATUS in the facility and certainly no hospice has been ordered probably as a result of the full code request  Medication list is reviewed she is on Risperdal 1.5 mg twice a day, labetalol 200 mg twice a day she has Roxanol and Ativan when necessary  Physical exam; pulse rate is 78 and regular respirations 16 Gen. she is not in any distress. Frail elderly woman Respiratory clear entry bilaterally Cardiac heart sounds are normal she does not appear to be grossly dehydrated Abdomen no liver no spleen no tenderness GU bladder is not distended no CVA tenderness Neurologic significant cogwheel rigidity and bradykinesia is noted Mental status; the patient patient is able to state her name and date of birth. Thinks she is in [place], year is 31.  Impression/plan #1 dementia which is probably Alzheimer's disease; this is listed as severe and hospital and I agree this is likely to be the case. 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 Ill  began a slow withdrawal of this medication #3 healthcare acquired pneumonia in the hospital she appears to be stable from this regard   wiThe patient was supposed to be referred to hospice and comfort care in the facility however I note she is a "full code" currently. Will discuss this with the staff. Followup lab work is ordered. Failure to thrive syndrom in a patient With severe dementia seems likely

## 2013-04-09 ENCOUNTER — Non-Acute Institutional Stay (SKILLED_NURSING_FACILITY): Payer: Medicare Other | Admitting: Internal Medicine

## 2013-04-09 DIAGNOSIS — D62 Acute posthemorrhagic anemia: Secondary | ICD-10-CM

## 2013-04-09 DIAGNOSIS — G219 Secondary parkinsonism, unspecified: Secondary | ICD-10-CM

## 2013-04-09 NOTE — Progress Notes (Signed)
Patient ID: Morgan [name], female   DOB: 04/04/1921, 79 y.o.   MRN: [id number] Facility; [facility] Chief complaint; review of medical issues including renal insufficiency, dementia and anemia History; this is an 79 year old lady who was hospitalized from July 29 through August the with a left lower pneumonia documented on CT scan, acute renal insufficiency with a creatinine of 1.78. She was given IV antibiotics and rehydrated. Discharge creatinine was 1.09. Apparently she had been in a nursing home prior to arrival here although I can't identify which facility. According to the staff she is eating 50-75% of her meals. Cannot see that she has had lab work done in the facility. Of note there was extensive discussion during the last hospitalization about comfort measures including hospice although the patient has a full CODE STATUS in the facility and certainly no hospice has been ordered probably as a result of the full code request  Routine lab work I ordered that showed continued stability of her BUN and creatinine at 24 and 0.98 respectively. Her hemoglobin however has dropped to 7.9 which is normochromic and normocytic. White count and platelet count are normal there are apparently 2 stool guaiacs off although I don't have any of these results. She is not actively bleeding  Lab Results  Component Value Date   WBC 8.3 03/20/2013   HGB 9.5* 03/20/2013   HCT 27.8* 03/20/2013   MCV 95.5 03/20/2013   PLT 291 03/20/2013    Medication list is reviewed she is on Risperdal 1.5 mg twice a day, labetalol 200 mg twice a day she has Roxanol and Ativan when necessary  Gen. she is not in any distress. Frail elderly woman Respiratory clear entry bilaterally Cardiac heart sounds are normal she does not appear to be grossly dehydrated Abdomen no liver no spleen no tenderness GU bladder is not distended no CVA tenderness Neurologic significant cogwheel rigidity and bradykinesia is noted Mental status; the  patient patient is able to state her name and date of birth. Thinks she is in [place], year is 65.  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 Ordered a taper of this last week which she seems to be tolerating well #3 healthcare acquired pneumonia in the hospital she appears to be stable from this regard #4 normochromic normocytic anemia. Her hemoglobin has dropped from 9.5 on August 5-7 0.9 now. I do not have any stool guaiacs. I'll set her up for a rectal exam with him next in the building she is not at the point of requiring transfusion I will start her on iron and proton pump inhibitor empirically.    This patient exhibits a failure to thrive syndrome associated with severe dementia. She also has drug-induced parkinsonism. Further she has severe anemia which is likely to be blood loss. Anticipate will need to be having ethical discussions with the family once again

## 2013-04-20 ENCOUNTER — Non-Acute Institutional Stay (SKILLED_NURSING_FACILITY): Payer: Medicare Other | Admitting: Internal Medicine

## 2013-04-20 DIAGNOSIS — R059 Cough, unspecified: Secondary | ICD-10-CM

## 2013-04-20 DIAGNOSIS — D649 Anemia, unspecified: Secondary | ICD-10-CM

## 2013-04-20 DIAGNOSIS — R05 Cough: Secondary | ICD-10-CM

## 2013-04-20 NOTE — Progress Notes (Signed)
Patient ID: Morgan [name], female   DOB: Dec 14, 1920, 79 y.o.   MRN: [id number]  This is an acute visit.  Facility [facility].  Level of care skilled.  Chief complaint-acute visit followup anemia-cough.  History of present illness.  Patient is a pleasant elderly resident who is here after hospitalization for pneumonia and what appears to be some failure to thrive issues.  According to nursing staff she is eating and drinking somewhat better although her weight continues to be somewhat variable-she does appear to be more alert and spry than when I saw her previously.  Dr. Harrington Challenger and has been following her anemia and ordered a lab which has come back showing a hemoglobin of 9.7 which is an improvement over the past 10 days from 8.9.  She does continue on iron-lab also shows a reticulocyte site percentage within normal limits at 1.59.  She has had occult stools tested for blood and they have been negative so far x2.  Another issue apparently was a cough that developed yesterday-I did listen to her lungs they appear to be clear nursing staff feels this has gotten better since yesterday.  She does not complaining of any shortness of breath.  Family medical social history as been reviewed per progress note on 04/02/2013.  Medications have been reviewed per MAR.  Review of systems.  General no complaints of fever or chills.  Respiratory does not complaining of shortness of breath or cough.  Cardiac no complaints of chest pain.  GI-does not complaining of abdominal pain nausea or vomiting.  Physical exam.  Temperature is 97.0 pulse 74 respirations 18 blood pressure 112/68.  In general this is a frail elderly female in no distress she is bright and alert today.  Her skin is warm and dry.  Heart is regular rate and rhythm without murmur gallop or rub.  Chest is clear to auscultation without any rhonchi rales or wheezes no labored breathing.  She does not appear to have any significant lower  extremity edema.  Abdomen is soft nontender with active bowel sounds.  Labs.  04/18/2013.  WBC 5.1 hemoglobin 9.7 platelets 277.  04/09/2013.  WBC 5.8 hemoglobin 8.9 platelets 252.  04/04/2013.  Sodium 137 potassium 4.3 BUN 24 creatinine 0.98.   assessment and plan.  #1-anemia-this appears to be improving she is on iron we'll check CBC in 2 weeks.  #2-cough-at this point I do not really see any evidence of anything sinister here exam was quite benign I continued to monitor.  [id number]

## 2013-04-26 ENCOUNTER — Non-Acute Institutional Stay (SKILLED_NURSING_FACILITY): Payer: Medicare Other | Admitting: Internal Medicine

## 2013-04-26 DIAGNOSIS — I10 Essential (primary) hypertension: Secondary | ICD-10-CM

## 2013-04-26 NOTE — Progress Notes (Signed)
Patient ID: Morgan [name], female   DOB: [birth year], 79 y.o.   MRN: [id number] This is an acute visit.  Level of care skilled.  Facility [facility].  Chief complaint-acute visit secondary to hypertension.  History of present illness.  Patient is a pleasant elderly resident who has had recent failure to thrive issues-also some history of renal insufficiency and anemia dementia as well as hypertension.  Nursing staff has noted some elevated blood pressure readings 180/78 taken manually today per chart review it appears her systolics often are elevated most recently 154/60-164/76-143/76-178/78.  Occasionally I will see systolics in the [id number] and 130s with these appear to be relatively infrequent -- She is on labetalol 200 mg a day .  Her pulses range from the 60s to 80s  She does not complaining of any headaches dizziness.  . Family medical social history as been reviewed.  Medications have been reviewed per [facility]  Review of systems Somewhat limited secondary to dementia but she is not complaining of any headache dizziness shortness of breath or chest pain.  According to nursing staff she is eating better.  .  .  Temperature is 97.2 pulse 68 respirations 22 blood pressure. Taken manually was 160/60 and this was fairly comparable in both arms. Active bowel sounds.   Abdomen is soft nontender with active bowel sounds  In general this is a frail elderly female in no distress lying comfortably in bed.  Her skin is warm and dry.  Oropharynx is clear mucous membranes moist.  Chest is clear to auscultation with somewhat poor respiratory effort no labored breathing.  Her abdomen is soft nontender with positive bowel sounds  Heart is regular rate and rhythm without murmur gallop or rub.  Abdomen is soft nontender   .  Labs.  04/18/2013.  WBC 5.1 hemoglobin 9.7 platelets 277.  Test for occult blood have been negative so far.  04/04/2013.  Sodium 137 potassium 4.3 BUN 24  creatinine 0.98  .  Assessment and plan.  Hypertension-does have somewhat consistently elevated systolics Will add low-dose Norvasc 2.5 mg a day and monitor this with manual blood pressures TID login book  for provider review next week.  [id number]

## 2013-05-10 DIAGNOSIS — N289 Disorder of kidney and ureter, unspecified: Secondary | ICD-10-CM | POA: Insufficient documentation

## 2013-05-10 DIAGNOSIS — R5383 Other fatigue: Secondary | ICD-10-CM | POA: Insufficient documentation

## 2013-05-10 DIAGNOSIS — R000 Tachycardia, unspecified: Secondary | ICD-10-CM | POA: Insufficient documentation

## 2013-05-10 DIAGNOSIS — F0390 Unspecified dementia without behavioral disturbance: Secondary | ICD-10-CM | POA: Insufficient documentation

## 2013-05-10 NOTE — Progress Notes (Signed)
Patient ID: Morgan [name], female   DOB: 06/23/1921, 79 y.o.   MRN: [id number]  This is an acute visit.  Level of care skilled.  Facility [facility].  Date is 03/21/2013.  Chief complaint.  Acute visit status post ER visit for lethargy and tachycardia-with recent hospitalization for pneumonia and failure to thrive.  History of present illness.  Patient is a 79 year old female who's had quite a complicated course recently.  She arrived at the facility yesterday from the hospital unfortunately she developed significant tachycardia with a pulse rate ranging up to 160 beats a minute and also was lethargic-I did speak with her responsible party and she was sent to the ER for evaluation.  She was essentially diagnosed with acute renal insufficiency and apparently received fluids and has come back to the facility her vital signs are stable she is not tachycardic and she is somewhat more responsive than she was yesterday.  She is on minimal medications including labetalol for history of hypertension her blood pressures are somewhat elevated today 160/95-178/79 we will have to monitor this.  She also continues on Ativan as needed as well as Roxanol as needed for pain.  With history of dementia she is on Risperdal as well.  There has been some discussion about a possible feeding tube versus comfort care-I did speak with her responsible party again today in she has expressed desires for  comfort care --no further IVs no hospitalization no labs no feeding tube and no antibiotics except for comfort measures .  Previous medical history as been reviewed per progress note 03/20/2013.  Medications have been reviewed per [facility] include labetalol Ativan Roxanol and Risperdal.  Review of systems-still not obtainable-according to nursing staff she is doing a  bit better but is still eating quite poorly--she is not complaining of pain or shortness of breath.  Physical exam.  Temperature is 97.3 pulse 91  respirations 20 blood pressure 160/95.  In general this is a very frail elderly female sitting in a wheelchair today she does look more responsive and is talking a bit today.  Her skin is warm and dry certainly cooler than it felt yesterday.  Oropharynx is clear mucous membranes appear moist.  Chest is clear to auscultation with continued poor effort no labored breathing.  Heart is regular rate and rhythm without murmur gallop or rub.  Her abdomen is soft nontender with positive bowel sounds.  Muscle skeletal does move all her extremities although very frail with lower extremity weakness there is some mild edema of her hands bilaterally I do not note any deformities other than arthritic changes.  Neurologic-appears to be grossly intact I do not see any focal weaknesses cranial nerves are grossly intact she is doing some talking today and will follow simple verbal commands.  Psych-appears oriented to self--is following some simple verbal commands today.  Labs.  03/20/2013.  WBC 8.3 hemoglobin 9.5 which actually is a improvement platelets of 291.  Sodium 131 potassium 4.3 BUN 37 creatinine 1.10.  Assessment and plan.  #1-tachycardia lethargy-this appears to be improved apparently she was rehydrated in the hospital-clinically appears better although blood pressure is somewhat high.  #2 hypertension-blood pressure is somewhat elevated we'll keep an eye on this she is on labetalol at this point we'll monitor secondary to we don't have very many readings would like see if this is a true trend.  #3 failure to thrive-as stated above I did have another fairly long discussion with her responsible party she has expressed desires for  comfort care-no feeding tube-no IVs no hospitalization or further labs-no antibiotics except for comfort measures.  #4-dementia- this appears to be quite significant although talking with family this has been a fairly rapid deterioration at one point was  ambulating in her assisted care facility-.  At this point patient appears to have a very guarded prognosis-certainly continue to encourage fluids and by mouth intake we'll continue to monitor her clinical status and blood pressures but she does appear to be somewhat improved from yesterday.  [id number] note 30 minutes spent assessing patient-another discussion with her responsible party-discussing her status with nursing staff-and formulating and coordinating plan of care

## 2013-05-10 NOTE — Progress Notes (Signed)
Patient ID: Morgan [name], female   DOB: December 01, 1920, 79 y.o.   MRN: [id number]  This is an acute visit.  Level of care skilled.  Facility [facility].  Date is 03/20/2013.  Chief complaint-acute visit status post hospitalization for pneumonia- dehydration with history of dementia.  History of present illness.  Patient is an 79 year old female with a history of dementia who came to the ER with increased lethargy and cough.  X-ray suggested pneumonia she was also found to be in acute renal failure.  She was started on IV fluids and antibiotics.  She was also pocketing her food in her cheeks.  Parachuted doing that for several months according to her niece who is her responsible party.  At one point she was walking with a cane in her assisted living facility but now appears to be wheelchair-bound.  She continued to lose weight.  And has had apparently numerous admissions to the hospital for dehydration and acute renal failure.  It was decided that she would need a higher level of care and thus she is here at skilled nursing after completing her antibiotics and IV therapy.  Family did discuss a PEG tube with the medical staff at the hospital but apparently this was not deemed to be in the patient's benefit in the long term.  With her advanced dementia and multiple medical problems comfort hospice care was recommended.  Although it isn't clear exactly if the family is comfortable with this in fact talking to her niece today it appears they still would like to think about this.  Previous medical history.  Pneumonia.  Acute renal failure.  Hyperkalemia.  Normocytic anemia.  Hypertension.  Dementia.  Protein calorie malnutrition.  Past surgical history.  Appendectomy and abdominal hysterectomy.   also history patient had been living in an assisted living facility but apparently had significant failure to thrive issues and weight loss.  No history of smoking or significant  alcohol or illicit drug use.  Family history cannot really obtain secondary to patient unable to provide with her significant dementia and lethargy today.  Hospital studies.  03/13/2013.  CT of the head-showed progressive atrophy and paraventricular white matter disease no acute findings.   medications.  Labetalol 200 mg twice a day.  Ativan 0.5 mg every 8 hours when necessary anxiety.  Morphine 10 mg every 2 hours when necessary pain.  Rispirdal 1.5 mg twice a day    Review of systems-unobtainable secondary to patient being somewhat lethargic and not really talking much.  Physical exam.  Temperature is 98.6 listed pulse is 88 respirations 20 blood pressure 170/84 this did-I checked it and got 138/60 manually and also I got the pulse to be 160-120 in this range.  In general this is a very frail elderly female in no distress but somewhat lethargic she does talk but haltingly.  Skin is slightly warm to touch.  Oropharynx she did not open her mouth very wide limited exam.  Heart is tachycardic--pulse is ranging from 120 gash to 160 at times very difficult to fully assess secondary to the elevated rate.  Chest is clear to auscultation with poor respiratory effort.  Abdomen soft nontender positive bowel sounds.  Muscle skeletal has general frailty I do not see any deformities in her extremities she does not have any lower extremity edema she's not really following commands very well so falsetto was difficult.  Neurologic -- this is somewhat difficult to assess I do not see any lateralizing findings or signs of CVA  Psych-she does have some history of dementia is not talking much right now.  Labs.  03/16/2013.  Sodium 136 potassium 4.2 BUN 28 creatinine 1.09.  03/19/2013.  TheWBBC 9.6 hemoglobin 8.9 platelets 255.  03/13/2013.  Liver function tests within normal limits albumin actually was 3.6.  Assessment and plan  #1-significant tachycardia and  lethargy-patient does not appear to be grossly unstable however a pulse rate hovering between 120 -- 160 is concerning-her blood pressure appears to be satisfactory-I did do serial exams and pulse rate did start to come down hovering morea round 120-- 1:30 range versus the 160 range-I did have  extensive discussion with her responsible party her niece  who I phoned about aggressiveness of care---at this point she does not appear comfortable going truly comfort care-and tuis will send patient to the ER for evaluation secondary to tachycardia lethargy   I do note she is on labetalol with some listed history of hypertension.  She does not appear to be in pain at this time she does have morphine for that.   also for dementia I suspect with some behaviors thatt she continues on the Risperdal     again will await ER evaluation and this was discussed extensively with her responsible party-   [id number]-of note 40 minutes spent assessing patient and reassess the patient-reviewing her hospital records-and discussion with her responsible party via phone about goals of care.  Of note greater than 50% of time spent coordinating plan of care-

## 2013-08-09 ENCOUNTER — Non-Acute Institutional Stay (SKILLED_NURSING_FACILITY): Payer: Medicare Other | Admitting: Internal Medicine

## 2013-08-09 DIAGNOSIS — R627 Adult failure to thrive: Secondary | ICD-10-CM

## 2013-08-09 DIAGNOSIS — R000 Tachycardia, unspecified: Secondary | ICD-10-CM

## 2013-08-09 DIAGNOSIS — D649 Anemia, unspecified: Secondary | ICD-10-CM

## 2013-08-09 DIAGNOSIS — I10 Essential (primary) hypertension: Secondary | ICD-10-CM

## 2013-08-09 DIAGNOSIS — F0391 Unspecified dementia with behavioral disturbance: Secondary | ICD-10-CM

## 2013-08-09 NOTE — Progress Notes (Signed)
Patient ID: Morgan [name], female   DOB: [birth year], 79 y.o.   MRN: [id number] This is a  Routine  visit.  Level of care skilled.  Facility [facility].  D.  Chief complaint.   Medical management hypertension anemia dementia failure to thrive .  History of present illness.  Patient is a 79 year old female who on her admission to the facility this summer had quite a complicated course with tachycardia he went back to the ER was diagnosed with acute renal insufficiency.  When she returned the facility there were significant lethargy and failure to thrive issues.  However this is improved quite significantly during her stay here she ambulates about the facility in a wheelchair and apparently has been quite stable for some time.  Her weight apparently is stable at around 100 pounds.  Apparently she is eating and drinking fairly well she is on supplements including Magic cup and Ensure and takes these well.  H does have a history of hypertension is non Labetolol 200 mg twice a day I did start her on Norvasc several months ago secondary to some elevated systolics most recent blood pressure is 141/61-147/76-highest recorded is 170/79-also 138/64--I took it [employer] and got 160/80.  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  Previous medical history as been reviewed per progress note 03/20/2013.   Medications have been reviewed per MAR .  Review of systems Gen. no complaints of fever or chills says she is doing well she is actually eating supper this evening.  Head ears eyes nose mouth and throat-he  has prescription lenses but does not complain of visual changes or sore throat.  Respiratory-no complaints shortness of breath or cough.  Cardiac no chest pain no edema.  GI-no abdominal pain nausea vomiting diarrhea or constipation.  Muscle skeletal does not complaining of joint pain does have diffuse arthritic changes.  Neurologic no complaints of headache or dizziness.  Psych some history of dementia this appears to be moderate does not appear to be anxious very pleasant lady .  Physical exam.  Temperature is 98.2 pulse 78 respirations 24 blood pressure 141/61 weight is 98 pounds  In general this is   af rail elderly female sitting in a wheelchair today she is bright and alert.  Her skin is warm and dry c  Oropharynx is clear mucous membranes appear moist.  Chest is clear to auscultation -- no labored breathing.  Heart is regular rate and rhythm without murmur gallop or rub.  Her abdomen is soft nontender with positive bowel sounds.  Muscle skeletal does move all her extremities  I do not note any deformities other than arthritic changes.  Neurologic-appears to be grossly intact I do not see any focal weaknesses cranial nerves are grossly intact .  Psych-appears oriented to self--seems to give a fairly accurate history I would say her dementia is mild to moderate  .  Labs  05/04/2013.  WBC 3.6 hemoglobin 10.1 platelets 261.  Sodium 134 potassium 4.6 BUN 18 creatinine 0.77 \.  03/20/2013.  WBC 8.3 hemoglobin 9.5 which actually is a  improvement platelets of 291.  Sodium 131 potassium 4.3 BUN 37 creatinine 1.10.   Assessment and plan.  #1-tachycardia lethargy-this largely has not been an issue anymore .  #2 hypertension-blood pressure is somewhat elevated yet we'll increase her Norvasc to 5 mg a day and monitor this twice a day with a log for provider review next week--also obtain BMP if family is okay with this .  #3 failure to thrive-doing much better than when she  originally came here she is alert responsive eating well-- very encouraging .  #4-dementia- I would say this is  Mild- moderate-she does function well in this setting she is on low-dose Risperdal in the evening -.  #5-anemia-her hemoglobin has steadily risen she is on iron twice a day----- if family is okay with this Will order an updated CBC .  [id number]

## 2013-08-29 ENCOUNTER — Non-Acute Institutional Stay (SKILLED_NURSING_FACILITY): Payer: Medicare Other | Admitting: Internal Medicine

## 2013-08-29 DIAGNOSIS — G219 Secondary parkinsonism, unspecified: Secondary | ICD-10-CM

## 2013-08-29 DIAGNOSIS — G2401 Drug induced subacute dyskinesia: Secondary | ICD-10-CM

## 2013-08-29 NOTE — Progress Notes (Signed)
Patient ID: Destinii Grieger, female   DOB: 07-10-1921, 80 y.o.   MRN: [id number] [facility] Chief complaint; followup both for extraparametal symptoms are related to Risperdal.  History; this is a 80 year old woman who I admitted the facility in late August of 2014. 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 We have gradually tapered this down to 0.25 mg at at bedtime and I don't believe there is been any significant behavioral issues. Other issues include a blood loss anemia but cause of which was never really clear although she has responded gradually to iron therapy with her last hemoglobin of 10.3 on 12/29  Physical examination Gen. very pleasant woman who carries on an active conversation. Respiratory clear entry bilaterally Cardiac heart sounds are normal she appears to be euvolemic Neurologic; her parkinsonism is much better, unfortunately she is graduated onto tardive dyskinesia including bucal oral and truncal rocking.  Impression/plan #1 tardive dyskinesia. Fortunately this does not appear to be bothersome to the patient . The Risperdal will need to stop. This may make the movements worsened short-term and I have spoken to the facility about this. #2 blood loss anemia which is responding to iron therapy. The source of this was never really determined nor do I think she is a candidate aggressively investigate

## 2013-10-30 ENCOUNTER — Encounter: Payer: Self-pay | Admitting: *Deleted

## 2013-11-04 ENCOUNTER — Encounter: Payer: Self-pay | Admitting: Internal Medicine

## 2013-11-04 ENCOUNTER — Non-Acute Institutional Stay (SKILLED_NURSING_FACILITY): Payer: Medicare Other | Admitting: Internal Medicine

## 2013-11-04 DIAGNOSIS — N289 Disorder of kidney and ureter, unspecified: Secondary | ICD-10-CM

## 2013-11-04 DIAGNOSIS — F0390 Unspecified dementia without behavioral disturbance: Secondary | ICD-10-CM

## 2013-11-04 DIAGNOSIS — I10 Essential (primary) hypertension: Secondary | ICD-10-CM

## 2013-11-04 DIAGNOSIS — D649 Anemia, unspecified: Secondary | ICD-10-CM

## 2013-11-04 DIAGNOSIS — R627 Adult failure to thrive: Secondary | ICD-10-CM

## 2013-11-04 NOTE — Progress Notes (Signed)
Patient ID: Morgan [name], female   DOB: 08-15-1921, 80 y.o.   MRN: [id number]                                              This is a  Routine  visit.   Level of care skilled.   Facility [facility].   D.   Chief complaint.    Medical management hypertension anemia dementia failure to thrive .   History of present illness.   Patient is a 80 year old female who on her admission to the facility this summer had quite a complicated course with tachycardia he went back to the ER was diagnosed with acute renal insufficiency.   When she returned the facility there were significant lethargy and failure to thrive issues.   However this is improved quite significantly during her stay here she ambulates about the facility in a wheelchair and apparently has been quite stable for some time.   Her weight apparently is stable at around 100 pounds.   Apparently she is eating and drinking fairly well she is on supplements including Magic cup and Ensure and takes these well.   H does have a history of hypertension is on Labetolol 200 mg twice a day I did start her on Norvasc several months ago secondary to some elevated systolics most recent blood pressures--121/68-126/75   In regards to dementia this appears to be somewhat moderate -- apparently there had been some behaviors in the past and she had been on Risperdal but this was discontinued a couple months ago and she has been stable in this regard   Tonight she is bright and alert--watching a basketball game        Previous medical history as been reviewed per progress note 03/20/2013--08/09/2013.    Medications have been reviewed per MAR .   Review of systems Gen. no complaints of fever or chills says she is doing well .   Head ears eyes nose mouth and throat-he has prescription lenses but does not complain of visual changes or sore throat.   Respiratory-no complaints shortness of breath or cough.   Cardiac no chest  pain no edema.   GI-no abdominal pain nausea vomiting diarrhea or constipation.   Muscle skeletal does not complaining of joint pain does have diffuse arthritic changes.   Neurologic no complaints of headache or dizziness.   Psych some history of dementia this appears to be moderate does not appear to be anxious very pleasant lady .   Physical exam.  Temperature is 97.3 pulse 67 respirations 20 blood pressure 121/68 weight is 102.2   In general this is   af rail elderly female sitting in a wheelchair today she is bright and alert.   Her skin is warm and dryc   Oropharynx is clear mucous membranes appear moist.   Chest is clear to auscultation -- no labored breathing.   Heart is regular rate and rhythm without murmur gallop or rub.   Her abdomen is soft nontender with positive bowel sounds.   Muscle skeletal does move all her extremities  I do not note any deformities other than arthritic changes.   Neurologic-appears to be grossly intact I do not see any focal weaknesses cranial nerves are grossly intact .   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   Labs  08/20/2013.  Sodium 139 potassium 4.8 BUN 26 creatinine 0.94.  08/13/2013.  WBC 4.1 hemoglobin 10.3 platelets 265.  Sodium 133 potassium 5 BUN 30 creatinine 1.13.     05/04/2013.   WBC 3.6 hemoglobin 10.1 platelets 261.   Sodium 134 potassium 4.6 BUN 18 creatinine 0.77 \.   03/20/2013.   WBC 8.3 hemoglobin 9.5 which actually is a improvement platelets of 291.   Sodium 131 potassium 4.3 BUN 37 creatinine 1.10.    Assessment and plan.   #1-tachycardia lethargy-this largely has not been an issue anymore recent pulses have been in the 60s-- .   #2 hypertension-this appears to be stable on labetalol and Norvasc  .   #3 failure to thrive-doing much better than when  she originally came here she is alert responsive eating fairly  well-- very encouraging .   #4-dementia- I would say this is  Mild- moderate-she does function well in this setting Risperdal has been discontinued with no adverse effects to my knowledge -.   #5-anemia-her hemoglobin has steadily risen she is on iron twice a day----- if family is okay with this Will order an updated CBC  #6-history of renal insufficiency will updated metabolic panel as well .   [id number]

## 2013-11-05 ENCOUNTER — Non-Acute Institutional Stay (SKILLED_NURSING_FACILITY): Payer: Medicare Other | Admitting: Internal Medicine

## 2013-11-05 DIAGNOSIS — N179 Acute kidney failure, unspecified: Secondary | ICD-10-CM

## 2013-11-08 ENCOUNTER — Encounter: Payer: Self-pay | Admitting: Internal Medicine

## 2013-11-08 ENCOUNTER — Non-Acute Institutional Stay (SKILLED_NURSING_FACILITY): Payer: Medicare Other | Admitting: Internal Medicine

## 2013-11-08 DIAGNOSIS — N289 Disorder of kidney and ureter, unspecified: Secondary | ICD-10-CM

## 2013-11-08 NOTE — Progress Notes (Signed)
Patient ID: Morgan [name], female   DOB: 27-Nov-1920, 80 y.o.   MRN: [id number]   This is an acute visit.  Level of care skilled.  Facility [facility].  Chief complaint-acute visit followup renal insufficiency.  R 8.   History of present illness.  Patient is a very pleasant 80 year old female Morgan [name] came here with significant failure to thrive issues however she has done markedly better eating and drinking-she has not really had any recent acute issues.  I did see her recently for routine visit in order labs which were remarkable for an elevated creatinine of 2.39 with a potassium of 5.5-Dr. Dellia Nims did see her and ordered an in and out cath which did not yield a great deal of urine less than 400 cc apparently.  He also ordered update lab work later in the week I have seen this and it appears her creatinine is down somewhat to 2.17 however BUN is up somewhat at 50 her potassium now is 5.4 sodium 140.  Clinically patient appears to be quite stable bright alert did not really see any changes here.  Family medical social history as been reviewed her previous progress notes on 08/09/2013 in 11/04/2013.  Medications at been reviewed per [facility] these are fairly minimal including Norvasc nadolol Prilosec and iron.  Review of systems.  In general denies any fever chills says she feels good.  Respiratory no complaint of shortness of breath or cough.  Cardiac no chest pain or significant edema.  GI does not complaining of any abdominal pain or difficulty swallowing no nausea or vomiting.  GU-does not complaining of any dysuria.  Physical exam.  Temperature is 97.3 pulse 65 respirations 20 blood pressure 121/68.  In general this is a very pleasant elderly female in no distress sitting comfortably in her wheelchair she is bright and alert.  Her skin is warm and dry skin turgor does not appear impaired.  Oropharynx clear mucous membranes moist.  Chest is clear to auscultation no labored  breathing.  Heart is regular rate and rhythm with an occasional irregular beat.  Her abdomen is soft nontender with active bowel sounds  Psych continues to be bright and alert interactive and appropriate.  Labs.  11/07/2013.  Sodium 140 potassium 5.4 BUN 50 creatinine 2.17.  11/05/2013.  WBC 4.8 hemoglobin 8.2 platelets 221.  Sodium 139 potassium 5.5 BUN 29 creatinine 2.39.  Liver function tests within normal limits except albumin of 3.2.  Jan- fifth 2015.  Sodium 139 potassium 4.8 BUN 26 creatinine 0.94.  Assessment and plan.  #1-renal insufficiency-I did discuss this with Dr. Dellia Nims via phone who evaluated the patient earlier this week-at this point she is clinically stable Will continue to monitor and recheck a metabolic panel next week she certainly does not appear clinically impaired-I suspect she has had declining renal function secondary to her relatively advanced age-this was discussed with Dr. Dellia Nims as long as she remains clinically stable eating and drinking will monitor--update  BMP next week   [id number]

## 2013-11-09 NOTE — Progress Notes (Addendum)
Patient ID: Morgan [name], female   DOB: 12-31-1920, 80 y.o.   MRN: [id number]                  PROGRESS NOTE  DATE:  11/05/2013    FACILITY: [facility]    LEVEL OF CARE:   SNF   Acute Visit   CHIEF COMPLAINT:  Renal insufficiency.    HISTORY OF PRESENT ILLNESS:  Morgan [name] had some lab work done, I think after a routine visit.  This came back today.  She has a sodium of 139, a potassium of 5.5, a BUN of 29, and a creatinine of 2.39.  BUN from 08/20/2013 was noted to be 26, and a creatinine of 0.94 on the same date.  She does not appear to be on any nephrotoxic medications that I can see.  She has not had any recent issues that anybody is aware of.    PAST MEDICAL HISTORY/PROBLEM LIST:  Reviewed.  It does allude to acute renal failure when she first came here at the end of July or early August 2014, at which time her creatinine was 1.78.  However, she was 1.09 at discharge.  I think this was felt to be prerenal.     CURRENT MEDICATIONS:  Medication list is reviewed.     Norvasc 5 mg q.d.    Prilosec 20 q.d.    Labetalol 200 twice a day.    Ferrous sulfate 300 once a day.    She is not on potassium, ACE inhibitors, or diuretics.    REVIEW OF SYSTEMS:  Not really possible related to dementia.  However, she is not complaining of any pain, breathing problems, or chest pain.    PHYSICAL EXAMINATION:   GENERAL APPEARANCE:  The patient is not in any distress.   CHEST/RESPIRATORY:  Shallow, but otherwise clear air entry.   CARDIOVASCULAR:  CARDIAC:   Heart sounds are normal.  She appears to be euvolemic.  There are no murmurs.  JVP is not elevated.   GASTROINTESTINAL:  ABDOMEN:   Distended; however, soft.  There are no masses.   GENITOURINARY:  BLADDER:   ?Bladder distention.    ASSESSMENT/PLAN:  Acute-on-chronic renal insufficiency in a 80 year-old woman.   She does not appear to be prerenal.  She is not on any nephrotoxic drugs.  I wonder whether there is a bladder  outlet obstruction issue here and I am going to have them place a Foley catheter.     She may need a renal ultrasound if indeed she is not purely comfort care  I note that she is [place] in the orders in her MAR.  However, her chart says Full Code.  This will certainly need to be reconciled.  I have discussed with staff.

## 2013-11-12 ENCOUNTER — Non-Acute Institutional Stay (SKILLED_NURSING_FACILITY): Payer: Medicare Other | Admitting: Internal Medicine

## 2013-11-12 DIAGNOSIS — N289 Disorder of kidney and ureter, unspecified: Secondary | ICD-10-CM

## 2013-11-12 DIAGNOSIS — E875 Hyperkalemia: Secondary | ICD-10-CM

## 2013-11-12 NOTE — Progress Notes (Signed)
Patient ID: Morgan [name], female   DOB: [birth year], 80 y.o.   MRN: [id number]   This is an acute visit.  Level of care skilled.  Facility [facility].  Chief complaint-acute visit followup renal insufficiency.-hyperkalemia   .  History of present illness.  Patient is a very pleasant 80 year old female -- came here with significant failure to thrive issues however she has done markedly better eating and drinking-she has not really had any recent acute issues.  I did see her recently for routine visit in order labs which were remarkable for an elevated creatinine of 2.39 with a potassium of 5.5-Dr. Dellia Nims did see her and ordered an in and out cath which did not yield a great deal of urine less than 400 cc apparently.  He also ordered update lab work later in the week  her creatinine was down somewhat to 2.17 however BUN t 50 her potassium was 5.4 sodium 140 We did do updated lab work today which shows creatinine is 2.4 BUN 50 sodium 140 and potassium is 5.9 .  Clinically patient appears to be quite stable bright alert donot really see any changes here--continues to eat and drink fairly well .  Family medical social history as been reviewed her previous progress notes on 08/09/2013 in 11/04/2013--11/08/13  .  Medications at been reviewed per [facility] these are fairly minimal including Norvasc labetalol Prilosec and iron .  Review of systems.  In general denies any fever chills says she feels good.  Respiratory no complaint of shortness of breath or cough.  Cardiac no chest pain or significant edema.  GI does not complaining of any abdominal pain or difficulty swallowing no nausea or vomiting.  GU-does not complaining of any dysuria .  Physical exam.  Temperature 97.8 pulse 77 respirations 20 blood pressure 134/65   In general this is a very pleasant elderly female in no distress sitting comfortably in her wheelchair she is bright and alert.  Her skin is warm and dry skin turgor does not appear impaired.    Oropharynx clear mucous membranes moist.  Chest is clear to auscultation no labored breathing.  Heart is regular rate and rhythm with an occasional irregular beat.  Her abdomen is soft nontender with active bowel sounds  Psych continues to be bright and alert interactive and appropriate--laughing very pleasant .  Labs 11/12/2013.  Sodium 140 potassium 5.9 BUN 50 creatinine 2.4 .  11/07/2013.  Sodium 140 potassium 5.4 BUN 50 creatinine 2.17.  11/05/2013.  WBC 4.8 hemoglobin 8.2 platelets 221.  Sodium 139 potassium 5.5 BUN 29 creatinine 2.39.  Liver function tests within normal limits except albumin of 3.2.  Jan- fifth 2015.  Sodium 139 potassium 4.8 BUN 26 creatinine 0.94.   Assessment and plan.  #1-renal insufficiency-with hyperkalemia-it appears patient will need routine management of the potassium since it is rising-will write an order for Kayexalate 15 mg every Monday and Thursday-recheck a stat basic metabolic panel tomorrow-.  As previously noted suspect this is chronic renal disease with patient's advanced age-clinically she appears to be quite stable  [id number]

## 2013-11-18 ENCOUNTER — Emergency Department (HOSPITAL_COMMUNITY)
Admission: EM | Admit: 2013-11-18 | Discharge: 2013-11-18 | Disposition: A | Discharge status: Home / Self Care | Payer: Medicare Other | Attending: Emergency Medicine | Admitting: Emergency Medicine

## 2013-11-18 ENCOUNTER — Encounter (HOSPITAL_COMMUNITY): Payer: Self-pay | Admitting: Emergency Medicine

## 2013-11-18 ENCOUNTER — Emergency Department (HOSPITAL_COMMUNITY): Payer: Medicare Other

## 2013-11-18 DIAGNOSIS — Z792 Long term (current) use of antibiotics: Secondary | ICD-10-CM | POA: Insufficient documentation

## 2013-11-18 DIAGNOSIS — R109 Unspecified abdominal pain: Secondary | ICD-10-CM | POA: Insufficient documentation

## 2013-11-18 DIAGNOSIS — R42 Dizziness and giddiness: Secondary | ICD-10-CM | POA: Insufficient documentation

## 2013-11-18 DIAGNOSIS — Z8639 Personal history of other endocrine, nutritional and metabolic disease: Secondary | ICD-10-CM | POA: Insufficient documentation

## 2013-11-18 DIAGNOSIS — Z862 Personal history of diseases of the blood and blood-forming organs and certain disorders involving the immune mechanism: Secondary | ICD-10-CM | POA: Insufficient documentation

## 2013-11-18 DIAGNOSIS — Z8659 Personal history of other mental and behavioral disorders: Secondary | ICD-10-CM | POA: Insufficient documentation

## 2013-11-18 DIAGNOSIS — I10 Essential (primary) hypertension: Secondary | ICD-10-CM | POA: Insufficient documentation

## 2013-11-18 DIAGNOSIS — N390 Urinary tract infection, site not specified: Secondary | ICD-10-CM | POA: Insufficient documentation

## 2013-11-18 DIAGNOSIS — F0390 Unspecified dementia without behavioral disturbance: Secondary | ICD-10-CM | POA: Insufficient documentation

## 2013-11-18 DIAGNOSIS — Z79899 Other long term (current) drug therapy: Secondary | ICD-10-CM | POA: Insufficient documentation

## 2013-11-18 DIAGNOSIS — K219 Gastro-esophageal reflux disease without esophagitis: Secondary | ICD-10-CM | POA: Insufficient documentation

## 2013-11-18 LAB — CBC WITH DIFFERENTIAL/PLATELET
BASOS ABS: 0.1 10*3/uL (ref 0.0–0.1)
Basophils Relative: 1 % (ref 0–1)
EOS ABS: 0.1 10*3/uL (ref 0.0–0.7)
Eosinophils Relative: 2 % (ref 0–5)
HCT: 26.6 % — ABNORMAL LOW (ref 36.0–46.0)
Hemoglobin: 8.8 g/dL — ABNORMAL LOW (ref 12.0–15.0)
LYMPHS ABS: 1.3 10*3/uL (ref 0.7–4.0)
Lymphocytes Relative: 24 % (ref 12–46)
MCH: 32.6 pg (ref 26.0–34.0)
MCHC: 33.1 g/dL (ref 30.0–36.0)
MCV: 98.5 fL (ref 78.0–100.0)
MONOS PCT: 14 % — AB (ref 3–12)
Monocytes Absolute: 0.8 10*3/uL (ref 0.1–1.0)
Neutro Abs: 3.3 10*3/uL (ref 1.7–7.7)
Neutrophils Relative %: 59 % (ref 43–77)
PLATELETS: 200 10*3/uL (ref 150–400)
RBC: 2.7 MIL/uL — ABNORMAL LOW (ref 3.87–5.11)
RDW: 14 % (ref 11.5–15.5)
WBC: 5.6 10*3/uL (ref 4.0–10.5)

## 2013-11-18 LAB — COMPREHENSIVE METABOLIC PANEL
ALBUMIN: 3.6 g/dL (ref 3.5–5.2)
ALT: 27 U/L (ref 0–35)
AST: 30 U/L (ref 0–37)
Alkaline Phosphatase: 129 U/L — ABNORMAL HIGH (ref 39–117)
BUN: 45 mg/dL — AB (ref 6–23)
CALCIUM: 9.3 mg/dL (ref 8.4–10.5)
CO2: 21 mEq/L (ref 19–32)
CREATININE: 1.96 mg/dL — AB (ref 0.50–1.10)
Chloride: 103 mEq/L (ref 96–112)
GFR calc non Af Amer: 21 mL/min — ABNORMAL LOW (ref >90)
GFR, EST AFRICAN AMERICAN: 25 mL/min — AB (ref >90)
Glucose, Bld: 113 mg/dL — ABNORMAL HIGH (ref 70–99)
POTASSIUM: 4.4 meq/L (ref 3.7–5.3)
Sodium: 139 mEq/L (ref 137–147)
TOTAL PROTEIN: 8.2 g/dL (ref 6.0–8.3)
Total Bilirubin: 0.3 mg/dL (ref 0.3–1.2)

## 2013-11-18 LAB — URINE MICROSCOPIC-ADD ON

## 2013-11-18 LAB — URINALYSIS, ROUTINE W REFLEX MICROSCOPIC
Bilirubin Urine: NEGATIVE
GLUCOSE, UA: NEGATIVE mg/dL
Hgb urine dipstick: NEGATIVE
Ketones, ur: NEGATIVE mg/dL
NITRITE: NEGATIVE
PH: 6.5 (ref 5.0–8.0)
Protein, ur: NEGATIVE mg/dL
SPECIFIC GRAVITY, URINE: 1.01 (ref 1.005–1.030)
Urobilinogen, UA: 0.2 mg/dL (ref 0.0–1.0)

## 2013-11-18 LAB — GLUCOSE, CAPILLARY: GLUCOSE-CAPILLARY: 96 mg/dL (ref 70–99)

## 2013-11-18 LAB — TROPONIN I: Troponin I: <0.3 ng/mL (ref <0.30)

## 2013-11-18 MED ORDER — DEXTROSE 5 % IV SOLN
1.0000 g | Freq: Once | INTRAVENOUS | Status: AC
  Administered 2013-11-18: 1 g via INTRAVENOUS
  Filled 2013-11-18: qty 10

## 2013-11-18 MED ORDER — CEPHALEXIN 500 MG PO CAPS: 500.0000 mg | ORAL_CAPSULE | Freq: Two times a day (BID) | ORAL | Status: DC

## 2013-11-18 MED ORDER — SODIUM CHLORIDE 0.9 % IV BOLUS (SEPSIS)
250.0000 mL | Freq: Once | INTRAVENOUS | Status: AC
  Administered 2013-11-18: 250 mL via INTRAVENOUS

## 2013-11-18 NOTE — ED Notes (Signed)
Patient arrives from [facility] with c/o dizziness with standing this AM. Patient found to have a fib on EKG done at [facility]. No prior history. Patient denies chest pain, denies shortness of breath. States she feels "ok". H/O dementia. Alert and oriented x 3 at present.

## 2013-11-18 NOTE — ED Provider Notes (Signed)
CSN: [id number]     Arrival date & time 11/18/13  1108 History   First MD Initiated Contact with Patient 11/18/13 1115    This chart was scribed for [employer]. Alvino Chapel, MD by Terressa Koyanagi, ED Scribe. This patient was seen in room APA05/APA05 and the patient's care was started at 11:20 AM.  PCP: [name], MD  Chief Complaint  Patient presents with  . Tachycardia   HPI HPI Comments: Morgan [name] is a 80 y.o. female, who is a resident of [facility], with a history of dementia, HTN, and GERD, who presents to the Emergency Department complaining of intermittent dizziness, which is aggravated with standing, onset yesterday. Pt denies nausea, chest pain, vomiting, diarrhea, dysuria, HA or urinary frequency. Pt reports confusion is at baseline.    Past Medical History  Diagnosis Date  . Hypertension   . Dementia   . Anxiety   . Anemia   . GERD (gastroesophageal reflux disease)   . Hyperpotassemia   . Other severe protein-calorie malnutrition   . Hyperpotassemia   . Dysphagia, oropharyngeal phase    Past Surgical History  Procedure Laterality Date  . Appendectomy    . Abdominal hysterectomy     No family history on file. History  Substance Use Topics  . Smoking status: Never Smoker   . Smokeless tobacco: Not on file  . Alcohol Use: No   OB History   Grav Para Term Preterm Abortions TAB SAB Ect Mult Living                 Review of Systems  Cardiovascular: Negative for chest pain.  Gastrointestinal: Negative for nausea, vomiting and diarrhea.  Genitourinary: Negative for dysuria and frequency.  Neurological: Positive for dizziness. Negative for headaches.      Allergies  Review of patient's allergies indicates no known allergies.  Home Medications   Current Outpatient Rx  Name  Route  Sig  Dispense  Refill  . amLODipine (NORVASC) 5 MG tablet   Oral   Take 5 mg by mouth daily. For HTN         . ferrous sulfate 300 (60 FE) MG/5ML syrup   Oral   Take 300 mg  by mouth daily. For Iron deficiency         . labetalol (NORMODYNE) 200 MG tablet   Oral   Take 200 mg by mouth 2 (two) times daily. For HTN         . loperamide (IMODIUM A-D) 2 MG tablet   Oral   Take 2 mg by mouth 4 (four) times daily as needed for diarrhea or loose stools. May give 4mg  initially then 2 mg there after.         Marland Kitchen omeprazole (PRILOSEC) 20 MG capsule   Oral   Take 20 mg by mouth daily. For GERD         . sodium polystyrene (KAYEXALATE) 15 GM/60ML suspension   Oral   Take 15 g by mouth 2 (two) times a week. Two times weekly on Monday and Thursday         . cephALEXin (KEFLEX) 500 MG capsule   Oral   Take 1 capsule (500 mg total) by mouth 2 (two) times daily.   14 capsule   0    Triage Vitals: BP 145/66  Pulse 70  Temp(Src) 97.6 F (36.4 C) (Oral)  Resp 16  Wt 103 lb (46.72 kg)  SpO2 100% Physical Exam  Nursing note and vitals reviewed.  Constitutional: She is oriented to person, place, and time. She appears well-developed and well-nourished. No distress.  HENT:  Head: Normocephalic and atraumatic.  Neck: Neck supple. No tracheal deviation present.  Cardiovascular: Normal rate and regular rhythm.   Pulmonary/Chest: Effort normal. No respiratory distress.  Abdominal: She exhibits distension (mild). There is no tenderness.  Musculoskeletal: Normal range of motion. She exhibits no edema.  Neurological: She is alert and oriented to person, place, and time.  Skin: Skin is warm and dry.  Psychiatric: She has a normal mood and affect. Her behavior is normal.    ED Course  Procedures (including critical care time) DIAGNOSTIC STUDIES:   COORDINATION OF CARE:  11:23 AM-Discussed treatment plan which includes EKG results.  with pt at bedside and pt agreed to plan.   Labs Review Labs Reviewed  CBC WITH DIFFERENTIAL - Abnormal; Notable for the following:    RBC 2.70 (*)    Hemoglobin 8.8 (*)    HCT 26.6 (*)    Monocytes Relative 14 (*)    All  other components within normal limits  COMPREHENSIVE METABOLIC PANEL - Abnormal; Notable for the following:    Glucose, Bld 113 (*)    BUN 45 (*)    Creatinine, Ser 1.96 (*)    Alkaline Phosphatase 129 (*)    GFR calc non Af Amer 21 (*)    GFR calc Af Amer 25 (*)    All other components within normal limits  URINALYSIS, ROUTINE W REFLEX MICROSCOPIC - Abnormal; Notable for the following:    APPearance CLOUDY (*)    Leukocytes, UA TRACE (*)    All other components within normal limits  URINE MICROSCOPIC-ADD ON - Abnormal; Notable for the following:    Squamous Epithelial / LPF FEW (*)    Bacteria, UA MANY (*)    All other components within normal limits  URINE CULTURE  TROPONIN I   Imaging Review Dg Chest 2 View  11/18/2013   CLINICAL DATA:  Tachycardia  EXAM: CHEST  2 VIEW  COMPARISON:  03/20/2013  FINDINGS: Cardiac shadow is stable. The lungs are clear bilaterally. No acute bony abnormality is noted. Chronic changes about the shoulder joints are seen.  IMPRESSION: No acute abnormality noted.   Electronically Signed   By: Inez Catalina M.D.   On: 11/18/2013 12:59     EKG Interpretation   Date/Time:  Sunday November 18 2013 11:11:33 EDT Ventricular Rate:  68 PR Interval:  166 QRS Duration: 80 QT Interval:  402 QTC Calculation: 427 R Axis:     Text Interpretation:  Normal sinus rhythm Septal infarct , age  undetermined Abnormal ECG When compared with ECG of 20-Mar-2013 20:24,  Vent. rate has decreased BY  35 BPM Confirmed by Alvino Chapel  MD, Jonella Redditt  [phone]) on 11/18/2013 3:21:32 PM      MDM   Final diagnoses:  UTI (urinary tract infection)    Patient with generalized weakness and lightheadedness. Lab work is near baseline. Creatinine the nursing home his been between 1.7 and 2.1. Also has mild anemia, but looks similar to previous. Found to have urinary tract infection. We'll give Rocephin and then discharged home with antibiotics.   urine cultures were sent. Patient was sent in  to rule out atrial fibrillation. Patient's had atrial fibrillation while she's been here. The EKG here is in sinus rhythm. The EKG sent from the nursing home, although have a computer reading of atrial fibrillation appears to be sinus rhythm. There is one PAC early  in the tracing with a compensatory pause. Will discharge I personally performed the services described in this documentation, which was scribed in my presence. The recorded information has been reviewed and is accurate.     Jasper Riling. Alvino Chapel, MD 11/18/13 [phone]

## 2013-11-18 NOTE — Discharge Instructions (Signed)
Urinary Tract Infection  Urinary tract infections (UTIs) can develop anywhere along your urinary tract. Your urinary tract is your body's drainage system for removing wastes and extra water. Your urinary tract includes two kidneys, two ureters, a bladder, and a urethra. Your kidneys are a pair of bean-shaped organs. Each kidney is about the size of your fist. They are located below your ribs, one on each side of your spine.  CAUSES  Infections are caused by microbes, which are microscopic organisms, including fungi, viruses, and bacteria. These organisms are so small that they can only be seen through a microscope. Bacteria are the microbes that most commonly cause UTIs.  SYMPTOMS   Symptoms of UTIs may vary by age and gender of the patient and by the location of the infection. Symptoms in young women typically include a frequent and intense urge to urinate and a painful, burning feeling in the bladder or urethra during urination. Older women and men are more likely to be tired, shaky, and weak and have muscle aches and abdominal pain. A fever may mean the infection is in your kidneys. Other symptoms of a kidney infection include pain in your back or sides below the ribs, nausea, and vomiting.  DIAGNOSIS  To diagnose a UTI, your caregiver will ask you about your symptoms. Your caregiver also will ask to provide a urine sample. The urine sample will be tested for bacteria and white blood cells. White blood cells are made by your body to help fight infection.  TREATMENT   Typically, UTIs can be treated with medication. Because most UTIs are caused by a bacterial infection, they usually can be treated with the use of antibiotics. The choice of antibiotic and length of treatment depend on your symptoms and the type of bacteria causing your infection.  HOME CARE INSTRUCTIONS   If you were prescribed antibiotics, take them exactly as your caregiver instructs you. Finish the medication even if you feel better after you  have only taken some of the medication.   Drink enough water and fluids to keep your urine clear or pale yellow.   Avoid caffeine, tea, and carbonated beverages. They tend to irritate your bladder.   Empty your bladder often. Avoid holding urine for long periods of time.   Empty your bladder before and after sexual intercourse.   After a bowel movement, women should cleanse from front to back. Use each tissue only once.  SEEK MEDICAL CARE IF:    You have back pain.   You develop a fever.   Your symptoms do not begin to resolve within 3 days.  SEEK IMMEDIATE MEDICAL CARE IF:    You have severe back pain or lower abdominal pain.   You develop chills.   You have nausea or vomiting.   You have continued burning or discomfort with urination.  MAKE SURE YOU:    Understand these instructions.   Will watch your condition.   Will get help right away if you are not doing well or get worse.  Document Released: 05/12/2005 Document Revised: 02/01/2012 Document Reviewed: 09/10/2011  [employer] Patient Information 2014 [employer]

## 2013-11-21 LAB — URINE CULTURE
Colony Count: >=100000
Special Requests: NORMAL

## 2013-12-25 ENCOUNTER — Non-Acute Institutional Stay (SKILLED_NURSING_FACILITY): Payer: Medicare Other | Admitting: Internal Medicine

## 2013-12-25 DIAGNOSIS — R000 Tachycardia, unspecified: Secondary | ICD-10-CM

## 2013-12-25 DIAGNOSIS — R627 Adult failure to thrive: Secondary | ICD-10-CM

## 2013-12-25 DIAGNOSIS — N289 Disorder of kidney and ureter, unspecified: Secondary | ICD-10-CM

## 2013-12-25 DIAGNOSIS — F0390 Unspecified dementia without behavioral disturbance: Secondary | ICD-10-CM

## 2013-12-25 DIAGNOSIS — I10 Essential (primary) hypertension: Secondary | ICD-10-CM

## 2013-12-25 DIAGNOSIS — D649 Anemia, unspecified: Secondary | ICD-10-CM

## 2013-12-25 DIAGNOSIS — E875 Hyperkalemia: Secondary | ICD-10-CM

## 2013-12-25 NOTE — Progress Notes (Signed)
Patient ID: Morgan [name], female   DOB: 1921-06-14, 80 y.o.   MRN: [id number]   This is a Routine visit.  Level of care skilled.  Facility [facility].  Marland Kitchen  Chief complaint.  Medical management hypertension anemia dementia failure to thrive  .  History of present illness.  Patient is a 80 year old female who on her admission to the facilitypast summer had quite a complicated course with tachycardia he went back to the ER was diagnosed with acute renal insufficiency.  When she returned the facility there were significant lethargy and failure to thrive issues.  However this is improved quite significantly during her stay here she ambulates about the facility in a wheelchair and apparently has been quite stable for some time--appears she's gained some weight although I do not see a recent weight will order these weekly so we can keep an eye on it.  Patient does have what appears to be chronic renal insufficiency with recent creatinine of 1.92 which appears to be her recent baseline there have been issues with some mild hyperkalemia and she is now on Kayexalate 3 times a week.  Apparently patient also has had some increased confusion apparently this is more so at night since he appears to be at her baseline today when I assessed her-a urine culture is pending her vital signs are stable she is afebrile does not complaining of any back pain or dysuria  Apparently she is eating and drinking fairly well she is on supplements including Magic cup and Ensure and takes these well.  H does have a history of hypertension is on Labetolol 200 mg twice a day I did start her on Norvasc several months ago secondary to some elevated systolics most recent blood pressures-106/61-159/82 appears to be in this range I do not see consistent systolic elevations  In regards to dementia this appears to be somewhat moderate -- apparently there had been some behaviors in the past and she had been on Risperdal but this was discontinued  and  she has been stable in this regard  This afternoon she appears bright alert cordial which is her baseline when I see her although apparently there are periods of agitation and confusion later at night   Previous medical history as been reviewed per progress note 03/20/2013--08/09/2013--11/04/2013 .  Medications have been reviewed per MAR  .  Review of systems  Gen. no complaints of fever or chills says she is doing well .  Head ears eyes nose mouth and throat-he has prescription lenses but does not complain of visual changes or sore throat.  Respiratory-no complaints shortness of breath or cough.  Cardiac no chest pain no edema.  GI-no abdominal pain nausea vomiting diarrhea or constipation. GU-does not complaining of any dysuria or back pain  Muscle skeletal does not complaining of joint pain does have diffuse arthritic changes.  Neurologic no complaints of headache or dizziness.  Psych some history of dementia this appears to be moderate does not appear to be anxious very pleasant lady --apparently some agitation confusion issues more so at night .  Physical exam. 298.0 pulse 66 respirations 20 blood pressure 106/71-159/82 in this range I do not see a recent weight most recent one apparently 102.2 back in March  In general this is af rail elderly female sitting in a wheelchair today she is bright and alert.  Her skin is warm and dryc  Oropharynx is clear mucous membranes appear moist.  Chest is clear to auscultation -- no labored breathing.  Heart is regular rate and rhythm without murmur gallop or rub.  Her abdomen is soft nontender with positive bowel sounds.  Muscle skeletal does move all her extremities  I do not note any deformities other than arthritic changes.  Neurologic-appears to be grossly intact I do not see any focal weaknesses cranial nerves are grossly intact .  Psych-appears oriented to self--seems to give a fairly accurate history I would say her dementia is mild to  moderate--- she was reading a newspaper and actually had good recall of the contents of the article  .  Labs 11/28/2013.  Sodium 139 potassium 5.5 BUN 41 creatinine 1.92.  Ictotest 2015.  Sodium 142 potassium 4.4 BUN 40 creatinine 1.93.  11/19/2013.  Sodium 142 potassium 4.5 BUN 37 creatinine 1.883  11/05/2013.  WBC 4.8 hemoglobin 8.2 platelets 224.  Liver function tests within normal limits except albumin of 3.2    08/20/2013.  Sodium 139 potassium 4.8 BUN 26 creatinine 0.94.  08/13/2013.  WBC 4.1 hemoglobin 10.3 platelets 265.  Sodium 133 potassium 5 BUN 30 creatinine 1.13.  05/04/2013.  WBC 3.6 hemoglobin 10.1 platelets 261.  Sodium 134 potassium 4.6 BUN 18 creatinine 0.77  \.  03/20/2013.  WBC 8.3 hemoglobin 9.5 which actually is a improvement platelets of 291.  Sodium 131 potassium 4.3 BUN 37 creatinine 1.10.  Assessment and plan.  #1-tachycardia lethargy-this largely has not been an issue anymore recent pulses have been in the 60s--  is on labetalol .  #2 hypertension-this appears to be stable on labetalol and Norvasc  .  #3 failure to thrive-doing much better than when she originally came here she is alert responsive eating fairly well--will order weight every week for now  .  #4-dementia- I would say this is Mild- -she does function well in this setting Risperdal has been discontinued -- --apparently lately there's been some agitation at night will have to keep an eye on this again a urine culture is pending as well--also consider psychiatric consult -.  #5-anemia-her hemoglobin -- she is on iron twice a day-----  Will order an updated CBC  #6-history of renal insufficiency will updated metabolic panel as well --with history of periodic hyperkalemia she is on routine Kayexalate .  [id number]

## 2013-12-28 ENCOUNTER — Encounter: Payer: Self-pay | Admitting: Internal Medicine

## 2013-12-31 ENCOUNTER — Inpatient Hospital Stay
Admission: RE | Admit: 2013-12-31 | Discharge: 2020-10-14 | Disposition: E | Payer: Medicare Other | Source: Ambulatory Visit | Attending: Internal Medicine | Admitting: Internal Medicine

## 2013-12-31 DIAGNOSIS — M79601 Pain in right arm: Secondary | ICD-10-CM

## 2013-12-31 DIAGNOSIS — M25561 Pain in right knee: Secondary | ICD-10-CM

## 2013-12-31 DIAGNOSIS — W19XXXA Unspecified fall, initial encounter: Secondary | ICD-10-CM

## 2013-12-31 DIAGNOSIS — M7989 Other specified soft tissue disorders: Secondary | ICD-10-CM

## 2013-12-31 DIAGNOSIS — Z87898 Personal history of other specified conditions: Principal | ICD-10-CM

## 2013-12-31 DIAGNOSIS — Z862 Personal history of diseases of the blood and blood-forming organs and certain disorders involving the immune mechanism: Secondary | ICD-10-CM

## 2014-01-02 ENCOUNTER — Non-Acute Institutional Stay (SKILLED_NURSING_FACILITY): Payer: Medicare Other | Admitting: Internal Medicine

## 2014-01-02 DIAGNOSIS — N289 Disorder of kidney and ureter, unspecified: Secondary | ICD-10-CM

## 2014-01-03 ENCOUNTER — Non-Acute Institutional Stay (SKILLED_NURSING_FACILITY): Payer: Medicare Other | Admitting: Internal Medicine

## 2014-01-03 ENCOUNTER — Encounter: Payer: Self-pay | Admitting: Internal Medicine

## 2014-01-03 DIAGNOSIS — R1110 Vomiting, unspecified: Secondary | ICD-10-CM

## 2014-01-03 DIAGNOSIS — D72829 Elevated white blood cell count, unspecified: Secondary | ICD-10-CM

## 2014-01-03 NOTE — Progress Notes (Signed)
Patient ID: Morgan [name], female   DOB: 1921-04-15, 80 y.o.   MRN: [id number]   This is an acute visit.  We will care skilled.  Facility [facility].  Chief complaint-acute visit secondary followup episode of vomiting yesterday-leukocytosis.  History of present illness.  Patient is a very pleasant elderly resident who came here with significant failure to thrive issues however has done markedly better eating and drinking and has not really had any recent acute issues other than some increased renal insufficiency which appears to have moderated at one point was 2.39 but now it has come down on recent labs into the high ones and labs done today it was actually 1.84 with a BUN of 43--she's also had some mild heart per kalemia which has been stable since Kayexalate routinely was initiated 3 times a week.  Apparently she had a episode of vomiting yesterday Dr. Dellia Nims did see her in order vital signs as well as labs for today.  Vital signs are stable she is afebrile she appears to be essentially at her baseline today ambulating in her wheelchair apparently eating and drinking at baseline has no complaints of shortness of breath cough or dysuria.  Her labs have come back with the only significant abnormality . white count of 13.3 with elevated granulocytes of 11.2  As noted she denies any dysuria or chest pain abdominal discomfort.  Review of systems.  General no complaints of fever or chills.  Respiratory does not complaining of any shortness of breath or cough.  Cardiac no chest pain.  GI no further vomiting or nausea does not complaining of any abdominal pain.  GU denies any dysuria or back pain.  Muscle skeletal does not complaining of any joint pain.  Neurologic no complaints of dizziness or headache or syncopal-type feelings  Physical exam.  Temperature is 98.1 pulse 73 respirations 20 blood pressure 126/57 O2 saturation is in the 90s on room air.  In general this is a very pleasant  80 year old female in no distress sitting comfortably in her wheelchair  Her skin is warm and dry  Eyes sclerae and meds were clear she has prescription lenses.  Oropharynx clear mucous membranes moist.  Chest is clear to auscultation no labored breathing.  Heart is regular rate and rhythm without murmur gallop or rub.  Abdomen is soft nontender positive bowel sounds.  GU-could not appreciate any suprapubic tenderness also no CV tenderness  Muscle skeletal moves all extremities at baseline he is ambulating in a wheelchair does not complaining of joint pain.  Neurologic is grossly intact her speech is clear she appears to be her pleasant talkative self a very pleasant lady.  Labs.  May 21st 2015.  WBC 13.3 hemoglobin 8.8 platelets 239 granulocytes elevated at 11.2.  Sodium 141 potassium 4.0 BUN 43 creatinine 1.84.  Liver function tests within normal limits except albumin of 3.3  Assessment and plan.  #1-leukocytosis with history of vomiting yesterday-clinically she certainly appears to be at her baseline do not really see any sign of sepsis-however with vomiting history will obtain a chest x-ray to rule out any aspiration-also she does have some history of UTIs in the past will order a UA C&S.  Continue to monitor her vital signs pulse ox every shift  Also will update CBC with differential tomorrow to see . Any trends  [id number].      Marland Kitchen

## 2014-01-04 ENCOUNTER — Non-Acute Institutional Stay (SKILLED_NURSING_FACILITY): Payer: Medicare Other | Admitting: Internal Medicine

## 2014-01-04 ENCOUNTER — Ambulatory Visit (HOSPITAL_COMMUNITY)
Admit: 2014-01-04 | Discharge: 2014-01-04 | Disposition: A | Discharge status: Home / Self Care | Payer: Medicare Other | Attending: Internal Medicine | Admitting: Internal Medicine

## 2014-01-04 DIAGNOSIS — J449 Chronic obstructive pulmonary disease, unspecified: Secondary | ICD-10-CM | POA: Insufficient documentation

## 2014-01-04 DIAGNOSIS — D72829 Elevated white blood cell count, unspecified: Secondary | ICD-10-CM | POA: Insufficient documentation

## 2014-01-04 DIAGNOSIS — R1110 Vomiting, unspecified: Secondary | ICD-10-CM | POA: Insufficient documentation

## 2014-01-04 DIAGNOSIS — M719 Bursopathy, unspecified: Secondary | ICD-10-CM | POA: Insufficient documentation

## 2014-01-04 DIAGNOSIS — J4489 Other specified chronic obstructive pulmonary disease: Secondary | ICD-10-CM | POA: Insufficient documentation

## 2014-01-04 DIAGNOSIS — D649 Anemia, unspecified: Secondary | ICD-10-CM

## 2014-01-04 DIAGNOSIS — M4120 Other idiopathic scoliosis, site unspecified: Secondary | ICD-10-CM | POA: Insufficient documentation

## 2014-01-04 DIAGNOSIS — I700 Atherosclerosis of aorta: Secondary | ICD-10-CM | POA: Insufficient documentation

## 2014-01-04 DIAGNOSIS — M67919 Unspecified disorder of synovium and tendon, unspecified shoulder: Secondary | ICD-10-CM | POA: Insufficient documentation

## 2014-01-04 NOTE — Progress Notes (Signed)
Patient ID: Morgan [name], female   DOB: 03-20-1921, 80 y.o.   MRN: [id number]   [facility]ility[facility] This is an acute visit.  Level of care skilled  Chief complaint-acute visit secondary to anemia-follow leukocytosis.  History of present illness.  Patient is a pleasant  80 year old female who was seen earlier this week for an episode of vomiting-lab work was ordered which was unremarkable except for an elevated white count of 13.0 with elevated granulocytes.  Lab work was ordered including a repeat CBC for today as well as a chest  x-ray and urine culture.  Chest x-ray has come back negative for any acute process urine culture is pending she continues to be afebrile and asymptomatic.  The white count has come down to 7.6.  Of note she does have a listed history of normocytic anemia CBC today interestingly shows hemoglobin of 8.4 which is a bit lower than her baseline which appears to run in the nines.--She is on iron  Hemoglobin was 8.8 on May 21.  This appears to be a macrocytic anemia with an MCV of 102.4  Family medical social history as been reviewed per discharge note on 03/19/2013.  Medications have been reviewed per MAR.  Review of systems.  In general continues to deny any fever chills.  Respiratory no shortness of breath or cough complaints.  Cardiac no chest pain or significant edema.  GI no further vomiting noted denies any abdominal pain or diarrhea.  GU does not complaining of dysuria.  Muscle skeletal as frailty but does not complaining of joint pain.  Neurologic no complaints of headache or dizziness or syncopal-type feelings.  Physical exam.  She is afebrile pulse is 76 respirations of 18 blood pressure pending.  In general this is a somewhat frail elderly female in no distress sitting comfortably in a wheelchair.  Her skin is warm and dry.  Oropharynx clear mucous membranes moist.  Chest is clear to auscultation no labored breathing.  Her heart is regular  rate and rhythm without murmur gallop or rub she does not have significant lower extremity edema.  Her abdomen continues to be soft nontender with positive bowel sounds.  GU no suprapubic tenderness.  Rectal-I did do a rectal exam for occult blood this was negative.  Neurologic is grossly intact her speech is clear.  Labs.  01/04/2014.  WBC 7.6 hemoglobin 8.4 platelets 208.  01/03/2014.  WBC 13.3 hemoglobin 8.8 platelets 239-of note MCV was 102.4 that's elevated  Sodium 141 potassium 4 BUN 43 creatinine 1.84.  Assessment and plan.  #1-leukocytosis this appears resolved-one would wonder if this was a reactive leukocytosis chest x-ray was negative for any acute process urine culture is pending she does not appear to be overtly symptomatic.  #2-anemia -- her hemoglobin appears to be dropping some from her baseline although she has had hemoglobins at times in the past in the 8's  per review of her progress notes today-- she continues on iron recently indicates elevated MCV we'll order lab work next week including a B12 and folate as well as iron studies reticulocyte count and ferritin also guaicstools x3.  [id number] note greater than 25 minutes spent assessing patient-reviewing her medical records in regards to anemia-and formulating-coordinating a plan of care of note greater than 50% of time spent coordinating plan of care  .    Marland Kitchen

## 2014-01-06 ENCOUNTER — Encounter: Payer: Self-pay | Admitting: Internal Medicine

## 2014-01-06 DIAGNOSIS — D649 Anemia, unspecified: Secondary | ICD-10-CM | POA: Insufficient documentation

## 2014-01-09 NOTE — Progress Notes (Signed)
Patient ID: Morgan [name], female   DOB: 16-Oct-1920, 80 y.o.   MRN: [id number]                  PROGRESS NOTE  DATE:  01/02/2014     FACILITY: [facility]    LEVEL OF CARE:   SNF   Acute Visit   CHIEF COMPLAINT:  Gurgling respirations.    HISTORY OF PRESENT ILLNESS:  I was called acutely today to a report that this patient had gurgling respirations with her head rolled back while sitting in the TV room, I believe.  She got back to bed.  While assessing her, she had two large emesis.    I note that she was recently put on antibiotics for a urinary tract infection on 12/27/2013.  She should be completing this.  She grew greater than 100,000 Proteus on a urine culture from 12/24/2013.    REVIEW OF SYSTEMS:  Really not possible from this frail, elderly woman with advanced dementia.     PHYSICAL EXAMINATION:   VITAL SIGNS:  Stable.   CHEST/RESPIRATORY:  Shallow, but otherwise clear air entry.   CARDIOVASCULAR:  CARDIAC:   Heart sounds are normal.  There are no murmurs.   GASTROINTESTINAL:  ABDOMEN:   Somewhat distended.  However, bowel sounds are positive.   LIVER/SPLEEN/KIDNEYS:  No liver, no spleen.  No tenderness.   GENITOURINARY:  BLADDER:   No suprapubic or costovertebral angle tenderness.  Her bladder did not appear to be distended.    ASSESSMENT/PLAN:  Emesis x2.  The cause of this is not completely clear.  She appears to be a lot better currently than when I saw her this morning.  I will recheck her lab work.    Severe chronic renal failure.  She has a creatinine of 1.92.   For a 80 year-old woman, this is really very significant.  I am not completely certain why her kidney function has deteriorated so much.  This appears to be a lot worse than the summer of 2014.  I will recheck this tomorrow, including her hemoglobin.    CPT CODE: 09811

## 2014-01-14 DEATH — deceased

## 2014-01-17 ENCOUNTER — Non-Acute Institutional Stay (SKILLED_NURSING_FACILITY): Payer: Medicare Other | Admitting: Internal Medicine

## 2014-01-17 DIAGNOSIS — N289 Disorder of kidney and ureter, unspecified: Secondary | ICD-10-CM

## 2014-01-17 DIAGNOSIS — D649 Anemia, unspecified: Secondary | ICD-10-CM

## 2014-01-17 NOTE — Progress Notes (Signed)
Patient ID: Morgan [name], female   DOB: 12-25-1920, 80 y.o.   MRN: [id number]   This is an acute visit.  Level of care skilled.  Facility [facility].  Chief complaint-acute visit secondary to anemia-follow leukocytosis.   History of present illness.    Patient is a very pleasant 80 year old female with a listed history of normocytic anemia he recently did lab work which showed her hemoglobin had dropped a bit into the eights range-normal she is running in the 9 stash this was a macrocytic anemia with an MCV greater than 102. We have been updated labs which shows hemoglobin is 8.2-this is high normocytic with an MCV of 98.8 platelets are 218.  Iron studies also were done showing an iron of 59 total iron binding capacity at 2:30 recheck percent at 1.5-vitamin B12 625 and folate greater than 20 ferritin was elevated at 974   Family medical social history as been reviewed per discharge note on 03/19/2013 .  Medications have been reviewed per MAR .  Review of systems.  In general continues to deny any fever chills.  Respiratory no shortness of breath or cough complaints.  Cardiac no chest pain or significant edema.     Neurologic no complaints of headache or dizziness or syncopal-type feelings.   Physical exam.  She is afebrile pulse is 78 respirations 18.  In general this is a somewhat frail elderly female in no distress sitting comfortably in a wheelchair.  Her skin is warm and dry.  Oropharynx clear mucous membranes moist.  Chest is clear to auscultation no labored breathing.  Her heart is regular rate and rhythm without murmur gallop or rub she does not have significant lower extremity edema.  Her abdomen continues to be soft nontender with positive bowel sounds.     Neurologic is grossly intact her speech is clear.   Labs  01/14/2014.  WBC 4.5 hemoglobin 8.2 platelets 218.  Iron 59-total iron binding capacity 2:30-reticulocyte percentage 1.5-folate greater than 20-B12  625-Ferritin  974 .  01/04/2014.  WBC 7.6 hemoglobin 8.4 platelets 208 .  01/03/2014.  WBC 13.3 hemoglobin 8.8 platelets 239-of note MCV was 102.4 that's elevated  Sodium 141 potassium 4 BUN 43 creatinine 1.84.   Assessment and plan.     #1-anemia -- her hemoglobin appears to be dropping some from her baseline although she has had hemoglobins at times in the past in the 8's per review of her progress notes t-- lab work indicates element of chronic disease-also elevated ferritin-at this point will monitor periodically-she appears stable we will update labs next week--also will discontinue the iron secondary to elevated ferritin indicating limited utility of using this  Of note--occult blood testing so far has been negative  #2 renal insufficiency with history of hyperkalemia past she is on routine Kayexalate twice a week as well update this as well clinically she appears stable  [id number].     Marland Kitchen

## 2014-03-06 ENCOUNTER — Encounter: Payer: Self-pay | Admitting: Internal Medicine

## 2014-03-06 ENCOUNTER — Non-Acute Institutional Stay (SKILLED_NURSING_FACILITY): Payer: Medicare Other | Admitting: Internal Medicine

## 2014-03-06 DIAGNOSIS — N289 Disorder of kidney and ureter, unspecified: Secondary | ICD-10-CM

## 2014-03-06 DIAGNOSIS — F0390 Unspecified dementia without behavioral disturbance: Secondary | ICD-10-CM

## 2014-03-06 DIAGNOSIS — E875 Hyperkalemia: Secondary | ICD-10-CM

## 2014-03-06 DIAGNOSIS — I10 Essential (primary) hypertension: Secondary | ICD-10-CM

## 2014-03-06 DIAGNOSIS — R627 Adult failure to thrive: Secondary | ICD-10-CM

## 2014-03-06 NOTE — Progress Notes (Signed)
Patient ID: Katieann Kelty, female   DOB: 25-Oct-1920, 80 y.o.   MRN: [id number]   This is a routine visit.  Level of care skilled.  Facility [facility].  Chief complaint-medical management of chronic medical issues including renal insufficiency-failure to thrive-anemia  Hypertension-dementia.  History of present illness.  Patient is a very pleasant 80 year old female who actually is doing quite stable now for some time.  She really came in with failure to thrive issues but she has responded extremely well during her stay here.  She has occasionally had a UTI-also has renal insufficiency which appears to be progressing-she has had a high potassium in the past this appears to have stabilized on routine Kayexalate we will  recheck this.  Appears she's gained about 6 pounds since March-she is eating and drinking pretty well according to nursing staff.  She does have a history of normocytic anemia suspect chronic we will recheck this as well.  Dementia-- would say moderate she does ambulate about the facility is usually pleasant and interactive and quite talkative.  She does have a history of hypertension she is on Norvasc and labetalol recent blood pressures 131/75-121/66.  Family medical social history as been reviewed her previous progress note as well as discharge summary on 03/19/2013.  Medications have been reviewed per MAR.  Review of systems    in general no complaints of fever chills she actually has slowly gained weight.  Skin does not complaining of rashes or itching.  Head ears eyes nose mouth and throat has prescription lenses does not complaining of visual changes.  Respiratory no shortness of breath or cough complaints.  Cardiac no chest pain or significant lower extremity edema.  GI does not complaining of any abdominal discomfort nausea vomiting diarrhea or constipation.  GU history UTIs but has not complained of dysuria today.  Musculoskeletal does not complaining of  joint pain.  Neurologic does not complaining of headaches or dizziness or syncopal-type feelings.  Psych is a history of dementia but this appears to be relatively stable is doing very well in this setting.  Labs.  01/21/2014.  WBC 3.4 hemoglobin 7.9 platelets 208.  01/18/2014.  Sodium 140 potassium 5 BUN 33 creatinine 1.41.  .  Frequent blood testing in late May and early June was negative.  01/07/2014.  Iron 59-total iron binding capacity 2:30-vitamin B12 625-folate greater than 20-ferritin elevated at 974.  Reticulocyte 1.5.  Assessment and plan.  #1-dementia with failure to thrive-this appears to be quite stable she is functioning well in this setting continue supportive care apparently she is eating and drinking fairly well --weights up be trending up.  She is on supplements including Magic Cup and Ensure.  #2-renal insufficiency this appears to be somewhat variable actually recent creatinine showed improvement will update this   #3-anemia of chronic disease-will update a CBC baseline appears to be more  in the eights most recently 7.9  #4-hypertension-at this point appear stable on labetalol as well as Norvasc  .5-history of hyperkalemia suspect due to renal insufficiency she is on Kayexalate 3 times a week will update metabolic panel  [id number]

## 2014-03-23 ENCOUNTER — Encounter: Payer: Self-pay | Admitting: Internal Medicine

## 2014-03-23 ENCOUNTER — Non-Acute Institutional Stay (SKILLED_NURSING_FACILITY): Payer: Medicare Other | Admitting: Internal Medicine

## 2014-03-23 DIAGNOSIS — N390 Urinary tract infection, site not specified: Secondary | ICD-10-CM

## 2014-03-23 DIAGNOSIS — R4182 Altered mental status, unspecified: Secondary | ICD-10-CM

## 2014-03-23 NOTE — Progress Notes (Signed)
Patient ID: Morgan [name], female   DOB: 12-31-1920, 80 y.o.   MRN: [id number]   This is an acute visit.  Level of care skilled.  Facility [facility].  Chief complaint-altered mental status.  History of present illness.  Patient is a pleasant elderly resident who actually has been quite stable for some period of time.  She came in with failure to thrive issues but she has done very well since she originally arrived-.  She does have a history of UTIs as well as renal insufficiency-he actually is on Kayexalate routinely secondary to elevated potassium.  Apparently earlier this evening nursing staff noted some altered mental status-patient was not responding as she usually does-she was lethargic-with painful stimuli apparently would open her eyes and speak minimally appear to be confused and cannot tell staff  her name-however a few minutes later she was more talkative knew name was conversant and by the time I saw her later in the evening she was pretty much at her baselin--lying in bed-talking normally and had no complaints any chest pain or shortness of breath.  Vital signs were stable-.  According to nursing staff the last time she did something similar to this she was diagnosed with a UTI that resolved unremarkably.  Family medical social history as been reviewed her previous progress notes most recently 03/06/2014.  Medications have been reviewed per MAR.  Review of systems.  In general she does not complaining of any fever or chills.  Skin is not complaining of any itching rashes.  Her story denies any shortness of breath or cough.  Cardiac no chest pain noted.  GI does not complaining of abdominal pain there was no nausea or vomiting noted.  GU does have a history of UTIs. Has some suprapubic tenderness tonight.  Muscle skeletal is not complaining of joint pain.  Neurologic is not complaining of any dizziness or headache or syncopal feelings.  Psych she does have some history  of dementia.  Physical exam.  Temperature is 97.9 pulse 62 respirations 20 blood pressure 99/58 O2 saturation is 98% on room air.  In general this is a pleasant elderly female in no distress lying comfortably in bed she is awake and responsive.  Her skin is warm and dry.  Eyes pupils appear reactive to light visual acuity appears intact sclera and conjunctiva are clear.  Oropharynx clear mucous membranes moist tongue is midline.  Chest is clear to auscultation there is no labored breathing.  Heart is regular rate and rhythm without murmur gallop or rub.  She does not really have significant lower extremity edema.  Abdomen is soft nontender there are positive bowel sounds.  GU do have some suprapubic tenderness-could not really appreciate any CV tenderness  Muscle skeletal does move all her extremities x4 with appropriate strength x4 her strength is strong she is able to lift her arms with adequate  strength with flexion and extension.  Also lift her legs against gravity with no difficulty--baseline with previous exams.  Neurologic as noted above cranial nerves are intact S. speech is clear no lateralizing findings.  Psych she is oriented to self tell me her name is pleasant and appropriate she usually has.  Labs.  03/07/2014.  WBC 4.4 hemoglobin 10.4 platelets 260.  Sodium 141 potassium 4.8 BUN 28 creatinine 1.27.  Liver function tests within normal limits except alkaline phosphatase 142.  Assessment and plan.  #1-altered mental status-at this point patient appears to be essentially back at her baseline-will write orders to monitor with  vital signs pulse oximeter checks every shift x48 hours with neurochecks--.  Also obtain CBC CMP and TSH stat in the morning-.  Nursing staff also will collect a urinalysis and culture.      [id number]

## 2014-05-01 ENCOUNTER — Encounter: Payer: Self-pay | Admitting: Internal Medicine

## 2014-05-01 ENCOUNTER — Non-Acute Institutional Stay (SKILLED_NURSING_FACILITY): Payer: Medicare Other | Admitting: Internal Medicine

## 2014-05-01 DIAGNOSIS — N289 Disorder of kidney and ureter, unspecified: Secondary | ICD-10-CM

## 2014-05-01 DIAGNOSIS — J302 Other seasonal allergic rhinitis: Secondary | ICD-10-CM

## 2014-05-01 DIAGNOSIS — E875 Hyperkalemia: Secondary | ICD-10-CM

## 2014-05-01 DIAGNOSIS — I10 Essential (primary) hypertension: Secondary | ICD-10-CM

## 2014-05-01 DIAGNOSIS — J3089 Other allergic rhinitis: Secondary | ICD-10-CM

## 2014-05-01 DIAGNOSIS — J309 Allergic rhinitis, unspecified: Secondary | ICD-10-CM | POA: Insufficient documentation

## 2014-05-01 DIAGNOSIS — F0390 Unspecified dementia without behavioral disturbance: Secondary | ICD-10-CM

## 2014-05-01 NOTE — Progress Notes (Signed)
Patient ID: Morgan [name], female   DOB: Jan 24, 1921, 80 y.o.   MRN: [id number]   This is a routine visit.  Level of care skilled.  Facility [facility].   Chief complaint-medical management of chronic medical issues including renal insufficiency-failure to thrive-anemia Hypertension-dementia.   History of present illness .  Patient is a very pleasant 80 year old female who actually is doing quite stable now for some time.  She really came in with failure to thrive issues but she has responded extremely well during her stay here.  She has occasionally had a UTI-also has renal insufficiency which appears to be progressing-she has had a high potassium in the past this appears to have stabilized on routine Kayexalate we will recheck this.  Appears she's gained about 6 pounds since March-she is eating and drinking pretty well according to nursing staff.  She does have a history of normocytic anemia suspect chronic we will recheck this as well.--Last hemoglobin was 9.0 on August 9  Dementia-- would say moderate she does ambulate about the facility is usually pleasant and interactive and quite talkative I did see her about a month ago she has increased lethargy and altered mental status this is a very short duration lab work returned unremarkable --she was treated empirically with amoxicillin for suspected UTI-although the urine culture eventually came back showing multiple morphotypes and none predominate-she has been her baseline since then no further episodes to my knowledge of lethargy her unresponsiveness.  She does have a history of hypertension she is on Norvasc and labetalol recent blood pressures  .  Family medical social history as been reviewed her previous progress note as well as discharge summary on 03/19/2013.   Medications have been reviewed per MAR .  Review of systems  in general no complaints of fever chills .  Skin does not complaining of rashes or itching.  Head ears eyes nose mouth and  throat has prescription lenses does not complaining of visual changes--nursing staff has noted some upper respiratory congestion nasal stuffiness.  Respiratory no shortness of breath or cough complaints--nursing staff has noted some nasal stuffiness.  Cardiac no chest pain or significant lower extremity edema.  GI does not complaining of any abdominal discomfort nausea vomiting diarrhea or constipation.  GU history UTIs but has not complained of dysuria today.  Musculoskeletal does not complaining of joint pain.  Neurologic does not complaining of headaches or dizziness or syncopal-type feelings.  Psych is a history of dementia but this appears to be relatively stable is doing very well in this setting  Physical exam.  Temperature is 98.4-pulse 74-respirations 18 blood pressure 138/62 previous blood pressures 130/61-146/75-weight 108.6 this appears to be a gain of about 6 pounds since March--O2 saturation 96% on room air.  General this is a pleasant elderly female in no distress sitting comfortably in her wheelchair.  Her skin is warm and dry.  Eyes she has prescription lenses pupils appear reactive to light sclera and conjunctiva are clear.  Nose I cannot appreciate any obstructions or active drainage although apparently she's had a small amount of clear drainage.  Throat--oropharynx clear mucous membranes moist.  Chest is clear to auscultation no labored breathing.  Heart is regular rate and rhythm without murmur gallop or rub.  She does not have significant lower extremity edema.  Abdomen soft nontender with positive bowel sounds.  Musculoskeletal does move all extremities at baseline continues to ambulate about facility in wheelchair.  Neurologic appears grossly intact speech is clear.  Psych she is  oriented to self is pleasant and appropriate at her baseline    .  Labs  . 03/24/2014.  WBC 3.7-hemoglobin 9.0-platelets 217.  Sodium 140 potassium 4.5 BUN 25 creatinine  1.32.  Liver function tests within normal limits except albumin of 3.3.    01/21/2014.  WBC 3.4 hemoglobin 7.9 platelets 208.  01/18/2014.  Sodium 140 potassium 5 BUN 33 creatinine 1.41.  .  Frequent blood testing in late May and early June was negative.  01/07/2014.  Iron 59-total iron binding capacity 2:30-vitamin B12 625-folate greater than 20-ferritin elevated at 974.  Reticulocyte 1.5.   Assessment and plan.   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  She is on supplements including Ensure .  #2-renal insufficiency this appears to be somewhat variable  will update this   #3-anemia of chronic disease-will update a CBC--her hemoglobin has been as low as 7.9   #4-hypertension-at this point appear relatively stable on labetalol as well as Norvasc\  .5-history of hyperkalem ia suspect due to renal insufficiency she is on Kayexalate 3 times a week will update metabolic panel   #6 allergic rhinitis-Will treat with Flonase one spray each nostril twice a day for 7 days and monitor-monitor vital signs pulse to keep an eye on her respiratory status although it appears to be doing well right now--no signs of congestion shortness of breath no fever   [phone]

## 2014-05-06 LAB — GLUCOSE, CAPILLARY
GLUCOSE-CAPILLARY: 93 mg/dL (ref 70–99)
GLUCOSE-CAPILLARY: 85 mg/dL (ref 70–99)
Glucose-Capillary: 81 mg/dL (ref 70–99)

## 2014-05-07 LAB — GLUCOSE, CAPILLARY: GLUCOSE-CAPILLARY: 78 mg/dL (ref 70–99)

## 2014-05-08 LAB — GLUCOSE, CAPILLARY: GLUCOSE-CAPILLARY: 86 mg/dL (ref 70–99)

## 2014-05-09 LAB — GLUCOSE, CAPILLARY: GLUCOSE-CAPILLARY: 89 mg/dL (ref 70–99)

## 2014-05-10 LAB — GLUCOSE, CAPILLARY: GLUCOSE-CAPILLARY: 83 mg/dL (ref 70–99)

## 2014-05-11 LAB — GLUCOSE, CAPILLARY: Glucose-Capillary: 82 mg/dL (ref 70–99)

## 2014-05-12 LAB — GLUCOSE, CAPILLARY: Glucose-Capillary: 86 mg/dL (ref 70–99)

## 2014-05-13 LAB — GLUCOSE, CAPILLARY: Glucose-Capillary: 83 mg/dL (ref 70–99)

## 2014-05-14 LAB — GLUCOSE, CAPILLARY: Glucose-Capillary: 89 mg/dL (ref 70–99)

## 2014-05-15 LAB — GLUCOSE, CAPILLARY: GLUCOSE-CAPILLARY: 82 mg/dL (ref 70–99)

## 2014-05-16 LAB — GLUCOSE, CAPILLARY: GLUCOSE-CAPILLARY: 79 mg/dL (ref 70–99)

## 2014-05-18 LAB — GLUCOSE, CAPILLARY
GLUCOSE-CAPILLARY: 90 mg/dL (ref 70–99)
Glucose-Capillary: 150 mg/dL — ABNORMAL HIGH (ref 70–99)
Glucose-Capillary: 79 mg/dL (ref 70–99)

## 2014-05-19 LAB — GLUCOSE, CAPILLARY: Glucose-Capillary: 96 mg/dL (ref 70–99)

## 2014-05-20 LAB — GLUCOSE, CAPILLARY: GLUCOSE-CAPILLARY: 82 mg/dL (ref 70–99)

## 2014-05-21 LAB — GLUCOSE, CAPILLARY: Glucose-Capillary: 81 mg/dL (ref 70–99)

## 2014-05-22 LAB — GLUCOSE, CAPILLARY: Glucose-Capillary: 82 mg/dL (ref 70–99)

## 2014-05-23 LAB — GLUCOSE, CAPILLARY: GLUCOSE-CAPILLARY: 81 mg/dL (ref 70–99)

## 2014-05-26 LAB — GLUCOSE, CAPILLARY
GLUCOSE-CAPILLARY: 78 mg/dL (ref 70–99)
Glucose-Capillary: 86 mg/dL (ref 70–99)
Glucose-Capillary: 88 mg/dL (ref 70–99)

## 2014-05-27 LAB — GLUCOSE, CAPILLARY: GLUCOSE-CAPILLARY: 89 mg/dL (ref 70–99)

## 2014-05-29 LAB — GLUCOSE, CAPILLARY
Glucose-Capillary: 85 mg/dL (ref 70–99)
Glucose-Capillary: 86 mg/dL (ref 70–99)

## 2014-05-30 LAB — GLUCOSE, CAPILLARY: GLUCOSE-CAPILLARY: 86 mg/dL (ref 70–99)

## 2014-05-31 LAB — GLUCOSE, CAPILLARY: GLUCOSE-CAPILLARY: 90 mg/dL (ref 70–99)

## 2014-06-02 LAB — GLUCOSE, CAPILLARY
GLUCOSE-CAPILLARY: 96 mg/dL (ref 70–99)
Glucose-Capillary: 86 mg/dL (ref 70–99)

## 2014-06-03 LAB — GLUCOSE, CAPILLARY: GLUCOSE-CAPILLARY: 86 mg/dL (ref 70–99)

## 2014-06-04 LAB — GLUCOSE, CAPILLARY: Glucose-Capillary: 86 mg/dL (ref 70–99)

## 2014-06-05 LAB — GLUCOSE, CAPILLARY: GLUCOSE-CAPILLARY: 86 mg/dL (ref 70–99)

## 2014-06-06 LAB — GLUCOSE, CAPILLARY: GLUCOSE-CAPILLARY: 84 mg/dL (ref 70–99)

## 2014-06-08 LAB — GLUCOSE, CAPILLARY: Glucose-Capillary: 86 mg/dL (ref 70–99)

## 2014-06-10 LAB — GLUCOSE, CAPILLARY: GLUCOSE-CAPILLARY: 84 mg/dL (ref 70–99)

## 2014-06-11 LAB — GLUCOSE, CAPILLARY: Glucose-Capillary: 93 mg/dL (ref 70–99)

## 2014-06-12 LAB — GLUCOSE, CAPILLARY: Glucose-Capillary: 95 mg/dL (ref 70–99)

## 2014-06-14 LAB — GLUCOSE, CAPILLARY
GLUCOSE-CAPILLARY: 88 mg/dL (ref 70–99)
GLUCOSE-CAPILLARY: 87 mg/dL (ref 70–99)

## 2014-06-15 LAB — GLUCOSE, CAPILLARY: Glucose-Capillary: 138 mg/dL — ABNORMAL HIGH (ref 70–99)

## 2014-06-16 LAB — GLUCOSE, CAPILLARY
Glucose-Capillary: 84 mg/dL (ref 70–99)
Glucose-Capillary: 87 mg/dL (ref 70–99)

## 2014-06-17 LAB — GLUCOSE, CAPILLARY: Glucose-Capillary: 95 mg/dL (ref 70–99)

## 2014-06-18 LAB — GLUCOSE, CAPILLARY: GLUCOSE-CAPILLARY: 77 mg/dL (ref 70–99)

## 2014-06-19 LAB — GLUCOSE, CAPILLARY: Glucose-Capillary: 97 mg/dL (ref 70–99)

## 2014-06-20 LAB — GLUCOSE, CAPILLARY: Glucose-Capillary: 89 mg/dL (ref 70–99)

## 2014-06-21 ENCOUNTER — Non-Acute Institutional Stay (SKILLED_NURSING_FACILITY): Payer: Medicare Other | Admitting: Internal Medicine

## 2014-06-21 DIAGNOSIS — I10 Essential (primary) hypertension: Secondary | ICD-10-CM

## 2014-06-21 DIAGNOSIS — F0391 Unspecified dementia with behavioral disturbance: Secondary | ICD-10-CM

## 2014-06-21 DIAGNOSIS — E875 Hyperkalemia: Secondary | ICD-10-CM

## 2014-06-21 DIAGNOSIS — F03918 Unspecified dementia, unspecified severity, with other behavioral disturbance: Secondary | ICD-10-CM

## 2014-06-21 DIAGNOSIS — N289 Disorder of kidney and ureter, unspecified: Secondary | ICD-10-CM

## 2014-06-21 LAB — GLUCOSE, CAPILLARY: Glucose-Capillary: 84 mg/dL (ref 70–99)

## 2014-06-21 NOTE — Progress Notes (Signed)
Patient ID: Morgan [name], female   DOB: 1921-02-12, 80 y.o.   MRN: [id number]                                           Status: Signed       Expand All Collapse All   Patient ID: Morgan [name], female DOB: [birth year], 80 y.o. MRN: [id number]   This is a routine visit.  Level of care skilled.  Facility [facility].   Chief complaint-medical management of chronic medical issues including renal insufficiency-failure to thrive-anemia Hypertension-dementia.  History of present illness .  Patient is a very pleasant 80 year old female who actually is doing quite stable now for some time.  She really came in with failure to thrive issues but she has responded extremely well during her stay here.  She has occasionally had a UTI-also has renal insufficiency which appears to be progressing-she has had a high potassium in the past this appears to have stabilized on routine Kayexalate we will recheck this.  Appears she's gained about 8 pounds since March-she is eating and drinking pretty well according to nursing staff.  She does have a history of normocytic anemia suspect chronic we will recheck this as well.--Last hemoglobin was 9.9 in September  Dementia-- would say moderate she does ambulate about the facility is usually pleasant and interactive and quite talkative    She does have a history of hypertension she is on Norvasc and labetalol recent blood pressurescontinue to be variable I got 120/80 manually-I see listed 138/70 I also see a 192 over  81 I do not see consistent elevations  She has complained of some abdominal discomfort on her right upper mouth  she does have numerous extractions -- will order a dental consult .  Family medical social history as been reviewed her previous progress note as well as discharge summary on 03/19/2013.   Medications have been reviewed per MAR .  Review of systems  in general no complaints of fever chills .  Skin does not complaining of  rashes or itching.  Head ears eyes nose mouth and throat has prescription lenses does not complaining of visual changes-she is complaining of some right upper mouth discomfort she says this is intermittent and not constant.  Respiratory no shortness of breath or cough complaints--n.  Cardiac no chest pain or significant lower extremity edema.  GI does not complaining of any abdominal discomfort nausea vomiting diarrhea or constipation.  GU history UTIs but has not complained of dysuria today.  Musculoskeletal does not complaining of joint pain.  Neurologic does not complaining of headaches or dizziness or syncopal-type feelings.  Psych is a history of dementia but this appears to be relatively stable is doing very well in this setting  Physical exam.   Temperature 97.5 pulse 76 respirations 20 blood pressure variable as noted above weight is 110  General this is a pleasant elderly female in no distress sitting comfortably in her wheelchair.  Her skin is warm and dry.  Eyes she has prescription lenses pupils appear reactive to light sclera and conjunctiva are clear.  Nose I cannot appreciate any obstructions or active drainage although apparently she's had a small amount of clear drainage.  Throat--oropharynx clear mucous membranes moist.--.  Mouth she does have numerous extractions I do not see any blisters ulcers right upper mouth--there is no bleeding or drainage-there is some mild tenderness  to palpation of the area  neck-could not appreciate any adenopathy  Chest is clear to auscultation no labored breathing.  Heart is regular rate and rhythm without murmur gallop or rub.  She does not have significant lower extremity edema.  Abdomen soft nontender with positive bowel sounds.  Musculoskeletal does move all extremities at baseline continues to ambulate about facility in wheelchair--her right upper arm she does appear to have somewhat of a biceps deformity this is  nontender nonerythematous not warm strength appears to be intact she flexes and extends without any difficulty-radial pulse is intact.  Neurologic appears grossly intact speech is clear.  Psych she is oriented to self is pleasant and appropriate at her baseline    .  Labs  05/02/2014.  Sodium 142 potassium 4.3 BUN 24 creatinine 1.26.  WBC 3.2 hemoglobin 9.9 platelets 240  . 03/24/2014.  WBC 3.7-hemoglobin 9.0-platelets 217.  Sodium 140 potassium 4.5 BUN 25 creatinine 1.32.  Liver function tests within normal limits except albumin of 3.3.   01/21/2014.  WBC 3.4 hemoglobin 7.9 platelets 208.  01/18/2014.  Sodium 140 potassium 5 BUN 33 creatinine 1.41.  .  Frequent blood testing in late May and early June was negative.  01/07/2014.  Iron 59-total iron binding capacity 2:30-vitamin B12 625-folate greater than 20-ferritin elevated at 974.  Reticulocyte 1.5.  Assessment and plan.  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  She is on supplements including Ensure .  #2-renal insufficiency this appears to be somewhat variable will update this  #3-anemia of chronic disease-will update a CBC--her hemoglobin has been as low as 7.9   #4-hypertension-at this point appear relatively stable on labetalol as well as Norvasc\there is some variability will order blood pressure checks daily with a log for provider review  .5-history of hyperkalemia ia suspect due to renal insufficiency she is on Kayexalate 3 times a week will update metabolic panel   #6  Mouth and gum discomfort-will order a dental consult also Orajel for pain relief and monitor.  #7-question right upper arm deformity-I suspect this is somewhat chronic she does not appear to be symptomatic of any pain or strength deficits-will order an  x-ray of the area however as well as a venous Doppler and monitor     [phone]

## 2014-06-22 ENCOUNTER — Inpatient Hospital Stay (HOSPITAL_COMMUNITY): Payer: Medicare Other | Attending: Internal Medicine

## 2014-06-22 LAB — GLUCOSE, CAPILLARY: Glucose-Capillary: 91 mg/dL (ref 70–99)

## 2014-06-23 LAB — GLUCOSE, CAPILLARY: GLUCOSE-CAPILLARY: 86 mg/dL (ref 70–99)

## 2014-06-24 LAB — GLUCOSE, CAPILLARY: GLUCOSE-CAPILLARY: 76 mg/dL (ref 70–99)

## 2014-06-26 LAB — GLUCOSE, CAPILLARY
GLUCOSE-CAPILLARY: 86 mg/dL (ref 70–99)
GLUCOSE-CAPILLARY: 87 mg/dL (ref 70–99)

## 2014-06-27 LAB — GLUCOSE, CAPILLARY: GLUCOSE-CAPILLARY: 84 mg/dL (ref 70–99)

## 2014-06-28 LAB — GLUCOSE, CAPILLARY: Glucose-Capillary: 83 mg/dL (ref 70–99)

## 2014-06-29 LAB — GLUCOSE, CAPILLARY: Glucose-Capillary: 84 mg/dL (ref 70–99)

## 2014-06-30 LAB — GLUCOSE, CAPILLARY: Glucose-Capillary: 91 mg/dL (ref 70–99)

## 2014-07-01 ENCOUNTER — Inpatient Hospital Stay (HOSPITAL_COMMUNITY)
Admit: 2014-07-01 | Discharge: 2014-07-01 | Disposition: A | Discharge status: Home / Self Care | Payer: Medicare Other | Attending: Internal Medicine | Admitting: Internal Medicine

## 2014-07-01 LAB — GLUCOSE, CAPILLARY: GLUCOSE-CAPILLARY: 86 mg/dL (ref 70–99)

## 2014-07-02 ENCOUNTER — Ambulatory Visit (HOSPITAL_COMMUNITY): Payer: Medicare Other

## 2014-07-02 LAB — GLUCOSE, CAPILLARY: Glucose-Capillary: 93 mg/dL (ref 70–99)

## 2014-07-03 LAB — GLUCOSE, CAPILLARY: Glucose-Capillary: 88 mg/dL (ref 70–99)

## 2014-07-05 LAB — GLUCOSE, CAPILLARY
Glucose-Capillary: 92 mg/dL (ref 70–99)
Glucose-Capillary: 81 mg/dL (ref 70–99)

## 2014-07-06 LAB — GLUCOSE, CAPILLARY: Glucose-Capillary: 78 mg/dL (ref 70–99)

## 2014-07-07 LAB — GLUCOSE, CAPILLARY: GLUCOSE-CAPILLARY: 82 mg/dL (ref 70–99)

## 2014-07-08 LAB — GLUCOSE, CAPILLARY: Glucose-Capillary: 78 mg/dL (ref 70–99)

## 2014-07-09 LAB — GLUCOSE, CAPILLARY: Glucose-Capillary: 93 mg/dL (ref 70–99)

## 2014-07-10 LAB — GLUCOSE, CAPILLARY: Glucose-Capillary: 80 mg/dL (ref 70–99)

## 2014-07-11 LAB — GLUCOSE, CAPILLARY: Glucose-Capillary: 92 mg/dL (ref 70–99)

## 2014-07-12 LAB — GLUCOSE, CAPILLARY: GLUCOSE-CAPILLARY: 95 mg/dL (ref 70–99)

## 2014-07-13 LAB — GLUCOSE, CAPILLARY: Glucose-Capillary: 82 mg/dL (ref 70–99)

## 2014-07-14 ENCOUNTER — Non-Acute Institutional Stay (SKILLED_NURSING_FACILITY): Payer: Medicare Other | Admitting: Internal Medicine

## 2014-07-14 ENCOUNTER — Encounter: Payer: Self-pay | Admitting: Internal Medicine

## 2014-07-14 DIAGNOSIS — I10 Essential (primary) hypertension: Secondary | ICD-10-CM

## 2014-07-14 DIAGNOSIS — M25569 Pain in unspecified knee: Secondary | ICD-10-CM | POA: Insufficient documentation

## 2014-07-14 DIAGNOSIS — E875 Hyperkalemia: Secondary | ICD-10-CM

## 2014-07-14 DIAGNOSIS — M25561 Pain in right knee: Secondary | ICD-10-CM

## 2014-07-14 DIAGNOSIS — N289 Disorder of kidney and ureter, unspecified: Secondary | ICD-10-CM

## 2014-07-14 LAB — GLUCOSE, CAPILLARY: GLUCOSE-CAPILLARY: 88 mg/dL (ref 70–99)

## 2014-07-14 NOTE — Progress Notes (Signed)
Patient ID: Morgan [name], female   DOB: Feb 21, 1921, 80 y.o.   MRN: [id number] Patient ID: Morgan [name], female   DOB: 1921/03/31, 80 y.o.   MRN: [id number]                                           Status: Signed       Expand All Collapse All   Patient ID: Morgan [name], female DOB: 1921/06/28, 80 y.o. MRN: [id number]   This is an acute .  Level of care skilled.  Facility [facility].   Chief complaint-acute visit follow-up right knee discomfort-hypertension.--Hyperkalemia-anemia  History of present illness .  Patient is a very pleasant 80 year old female who actually is doing quite stable now for some time.  She really came in with failure to thrive issues but she has responded extremely well during her stay here.  She has occasionally had a UTI-also has renal insufficiency which appears to be progressing-she has had a high potassium in the past this appears to have stabilized on routine Kayexalate -- lab done November 9 showed stability with potassium 4.3    She does have a history of normocytic anemia suspect chronic globin has been variable most recently 8.6 in early November this has been relatively stable it was as high as 9.9 back September but also has been in the low eights as well Dementia-- would say moderate she does ambulate about the facility is usually pleasant and interactive and quite talkative    She does have a history of hypertension she is on Norvasc and labetalol--she has had some variable blood pressures so staff has been checking this more frequently and these actually appear to be stable most recently 1. 5/76-110/58-108/60-I do see 154/80 155/90 but these are fairly rare I suspect some of this may be activity or agitation related  Apparently recently she had a fall on complain of some right knee discomfort-an x-ray showed degenerative changes of the right knee-Percocet lateral compartment with concavity of the lateral tibial plateau associated knee joint and  fusion-x-ray was unable to fully exclude central compression deformity of the lateral plateau-there was a suggestion for follow-up CT scan-however apparently there are insurance issues so this has not been done.  Nonetheless she is no longer complaining of knee pain and appears to be at her baseline ambulating about the facility in the wheelchair without any difficulty   .  Family medical social history as been reviewed her previous progress note as well as discharge summary on 03/19/2013.   Medications have been reviewed per MAR .  Review of systems  in general no complaints of fever chills .  Skin does not complaining of rashes or itching.  Head ears eyes nose mouth and throat has prescription lenses does not complaining of visual changes-  Respiratory no shortness of breath or cough complaints--n.  Cardiac no chest pain or significant lower extremity edema.  GI does not complaining of any abdominal discomfort nausea vomiting diarrhea or constipation.  GU history UTIs but has not complained of dysuria today.  Musculoskeletal does not complaining of joint pain--not complaining of right knee pain this evening.  Neurologic does not complaining of headaches or dizziness or syncopal-type feelings.  Psych is a history of dementia but this appears to be relatively stable is doing very well in this setting  Physical exam.   He is afebrile with a pulse of 80  respirations 18 blood pressures as noted in history of present illness  General this is a pleasant elderly female in no distress . Lying comfortably in bed  Her skin is warm and dry.  Eyes she has prescription lenses pupils appear reactive to light sclera and conjunctiva are clear.  .  Throat--oropharynx clear mucous membranes moist.--.  Mouth she does have numerous extractions his membranes are moist  neck-could not appreciate any adenopathy .  Heart is regular rate and rhythm without murmur gallop or rub.  She  does not have significant lower extremity edema.  Abdomen soft nontender with positive bowel sounds.  Musculoskeletal does move all extremities at baseline continues to ambulate about facility in wheelchair--her right upper arm she does appear to have somewhat of a biceps deformity this is nontender nonerythematous not warm strength appears to be intact she flexes and extends without any difficulty-radial pulse is intact.  I did assess her knees bilaterally I did not really note significant edema no erythema or tenderness to palpation-when the right knee is flexed and extended she says occasionally she will hear what appears to be some crepitus but does not really complain of any significant pain I do not note any deformity-or any tenderness with palpation of the area including the lower tibial area  Neurologic appears grossly intact speech is clear.  Psych she is oriented to self is pleasant and appropriate at her baseline    .  Labs  06/24/2014.  WBC 4.1 hemoglobin 8.6 platelets 2:15.  Sodium 140 potassium 4.3 BUN 29 creatinine 1.41.    05/02/2014.  Sodium 142 potassium 4.3 BUN 24 creatinine 1.26.  WBC 3.2 hemoglobin 9.9 platelets 240  . 03/24/2014.  WBC 3.7-hemoglobin 9.0-platelets 217.  Sodium 140 potassium 4.5 BUN 25 creatinine 1.32.  Liver function tests within normal limits except albumin of 3.3.   01/21/2014.  WBC 3.4 hemoglobin 7.9 platelets 208.  01/18/2014.  Sodium 140 potassium 5 BUN 33 creatinine 1.41.  .  Frequent blood testing in late May and early June was negative.  01/07/2014.  Iron 59-total iron binding capacity 2:30-vitamin B12 625-folate greater than 20-ferritin elevated at 974.  Reticulocyte 1.5.  Assessment and plan.  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  #2-renal insufficiency  this appears to be somewhat variable-we will update this .    #3-anemia of chronic disease-will update a CBC--her hemoglobin has been as low as 7.9 --will update this  as well  #4-hypertension-at this point appear relatively stable on labetalol as well as Norvasc\there is some variability but no consistent elevations  .5-history of hyperkalemia ia suspect due to renal insufficiency she is on Kayexalate 3 times a week will update metabolic panel   #6  Right knee pain-apparently this has not been an issue for some time-again there are insurance issues with obtaining a CT scan-at this point will monitor clinically but she appears to be doing well with no pain complaints here for some time-physical exam was quite benign  #7-question right upper arm deformity-I suspect this is somewhat chronic she does not appear to be symptomatic of any pain or strength deficits-an x-ray and venous Doppler of the area did not really show anything concerning     [phone]

## 2014-07-15 LAB — GLUCOSE, CAPILLARY: Glucose-Capillary: 84 mg/dL (ref 70–99)

## 2014-07-16 LAB — GLUCOSE, CAPILLARY: Glucose-Capillary: 87 mg/dL (ref 70–99)

## 2014-07-17 LAB — GLUCOSE, CAPILLARY: Glucose-Capillary: 87 mg/dL (ref 70–99)

## 2014-07-18 LAB — GLUCOSE, CAPILLARY: Glucose-Capillary: 94 mg/dL (ref 70–99)

## 2014-07-19 LAB — GLUCOSE, CAPILLARY: Glucose-Capillary: 89 mg/dL (ref 70–99)

## 2014-07-20 LAB — GLUCOSE, CAPILLARY: Glucose-Capillary: 96 mg/dL (ref 70–99)

## 2014-07-21 LAB — GLUCOSE, CAPILLARY: Glucose-Capillary: 88 mg/dL (ref 70–99)

## 2014-07-22 LAB — GLUCOSE, CAPILLARY: GLUCOSE-CAPILLARY: 87 mg/dL (ref 70–99)

## 2014-07-23 LAB — GLUCOSE, CAPILLARY: Glucose-Capillary: 78 mg/dL (ref 70–99)

## 2014-07-24 LAB — GLUCOSE, CAPILLARY: Glucose-Capillary: 81 mg/dL (ref 70–99)

## 2014-07-25 LAB — GLUCOSE, CAPILLARY: Glucose-Capillary: 78 mg/dL (ref 70–99)

## 2014-07-26 LAB — GLUCOSE, CAPILLARY: Glucose-Capillary: 80 mg/dL (ref 70–99)

## 2014-07-27 LAB — GLUCOSE, CAPILLARY: Glucose-Capillary: 87 mg/dL (ref 70–99)

## 2014-07-29 LAB — GLUCOSE, CAPILLARY
GLUCOSE-CAPILLARY: 89 mg/dL (ref 70–99)
Glucose-Capillary: 93 mg/dL (ref 70–99)

## 2014-07-30 LAB — GLUCOSE, CAPILLARY: GLUCOSE-CAPILLARY: 88 mg/dL (ref 70–99)

## 2014-07-31 LAB — GLUCOSE, CAPILLARY: Glucose-Capillary: 81 mg/dL (ref 70–99)

## 2014-08-01 LAB — GLUCOSE, CAPILLARY: Glucose-Capillary: 80 mg/dL (ref 70–99)

## 2014-08-02 LAB — GLUCOSE, CAPILLARY: Glucose-Capillary: 90 mg/dL (ref 70–99)

## 2014-08-03 LAB — GLUCOSE, CAPILLARY: Glucose-Capillary: 79 mg/dL (ref 70–99)

## 2014-08-04 LAB — GLUCOSE, CAPILLARY: Glucose-Capillary: 84 mg/dL (ref 70–99)

## 2014-08-05 LAB — GLUCOSE, CAPILLARY: Glucose-Capillary: 78 mg/dL (ref 70–99)

## 2014-08-06 LAB — GLUCOSE, CAPILLARY: GLUCOSE-CAPILLARY: 85 mg/dL (ref 70–99)

## 2014-08-07 LAB — GLUCOSE, CAPILLARY: Glucose-Capillary: 85 mg/dL (ref 70–99)

## 2014-08-08 LAB — GLUCOSE, CAPILLARY: Glucose-Capillary: 74 mg/dL (ref 70–99)

## 2014-08-09 LAB — GLUCOSE, CAPILLARY: Glucose-Capillary: 85 mg/dL (ref 70–99)

## 2014-08-10 LAB — GLUCOSE, CAPILLARY: GLUCOSE-CAPILLARY: 81 mg/dL (ref 70–99)

## 2014-08-11 LAB — GLUCOSE, CAPILLARY: GLUCOSE-CAPILLARY: 80 mg/dL (ref 70–99)

## 2014-08-12 LAB — GLUCOSE, CAPILLARY: GLUCOSE-CAPILLARY: 85 mg/dL (ref 70–99)

## 2014-08-13 LAB — GLUCOSE, CAPILLARY: Glucose-Capillary: 81 mg/dL (ref 70–99)

## 2014-08-14 LAB — GLUCOSE, CAPILLARY: Glucose-Capillary: 85 mg/dL (ref 70–99)

## 2014-08-15 LAB — GLUCOSE, CAPILLARY: GLUCOSE-CAPILLARY: 83 mg/dL (ref 70–99)

## 2014-08-16 LAB — GLUCOSE, CAPILLARY: Glucose-Capillary: 88 mg/dL (ref 70–99)

## 2014-08-17 LAB — GLUCOSE, CAPILLARY: Glucose-Capillary: 83 mg/dL (ref 70–99)

## 2014-08-18 LAB — GLUCOSE, CAPILLARY: Glucose-Capillary: 86 mg/dL (ref 70–99)

## 2014-08-19 LAB — GLUCOSE, CAPILLARY: Glucose-Capillary: 82 mg/dL (ref 70–99)

## 2014-08-20 LAB — GLUCOSE, CAPILLARY: Glucose-Capillary: 86 mg/dL (ref 70–99)

## 2014-08-21 LAB — GLUCOSE, CAPILLARY: Glucose-Capillary: 93 mg/dL (ref 70–99)

## 2014-08-22 LAB — GLUCOSE, CAPILLARY: Glucose-Capillary: 85 mg/dL (ref 70–99)

## 2014-08-23 LAB — GLUCOSE, CAPILLARY: Glucose-Capillary: 83 mg/dL (ref 70–99)

## 2014-08-24 LAB — GLUCOSE, CAPILLARY: Glucose-Capillary: 91 mg/dL (ref 70–99)

## 2014-08-25 LAB — GLUCOSE, CAPILLARY: Glucose-Capillary: 84 mg/dL (ref 70–99)

## 2014-08-26 LAB — GLUCOSE, CAPILLARY: GLUCOSE-CAPILLARY: 86 mg/dL (ref 70–99)

## 2014-08-27 LAB — GLUCOSE, CAPILLARY: GLUCOSE-CAPILLARY: 91 mg/dL (ref 70–99)

## 2014-08-28 LAB — GLUCOSE, CAPILLARY: Glucose-Capillary: 78 mg/dL (ref 70–99)

## 2014-08-29 LAB — GLUCOSE, CAPILLARY: Glucose-Capillary: 85 mg/dL (ref 70–99)

## 2014-08-30 LAB — GLUCOSE, CAPILLARY: Glucose-Capillary: 76 mg/dL (ref 70–99)

## 2014-08-31 LAB — GLUCOSE, CAPILLARY: GLUCOSE-CAPILLARY: 84 mg/dL (ref 70–99)

## 2014-09-01 LAB — GLUCOSE, CAPILLARY: Glucose-Capillary: 77 mg/dL (ref 70–99)

## 2014-09-02 LAB — GLUCOSE, CAPILLARY: GLUCOSE-CAPILLARY: 81 mg/dL (ref 70–99)

## 2014-09-03 LAB — GLUCOSE, CAPILLARY: Glucose-Capillary: 89 mg/dL (ref 70–99)

## 2014-09-04 LAB — GLUCOSE, CAPILLARY: Glucose-Capillary: 86 mg/dL (ref 70–99)

## 2014-09-05 LAB — GLUCOSE, CAPILLARY: Glucose-Capillary: 82 mg/dL (ref 70–99)

## 2014-09-06 LAB — GLUCOSE, CAPILLARY: Glucose-Capillary: 82 mg/dL (ref 70–99)

## 2014-09-07 LAB — GLUCOSE, CAPILLARY: Glucose-Capillary: 78 mg/dL (ref 70–99)

## 2014-09-08 LAB — GLUCOSE, CAPILLARY: Glucose-Capillary: 94 mg/dL (ref 70–99)

## 2014-09-09 LAB — GLUCOSE, CAPILLARY: Glucose-Capillary: 84 mg/dL (ref 70–99)

## 2014-09-10 LAB — GLUCOSE, CAPILLARY: Glucose-Capillary: 84 mg/dL (ref 70–99)

## 2014-09-11 LAB — GLUCOSE, CAPILLARY: Glucose-Capillary: 79 mg/dL (ref 70–99)

## 2014-09-15 LAB — GLUCOSE, CAPILLARY: Glucose-Capillary: 78 mg/dL (ref 70–99)

## 2014-09-16 LAB — GLUCOSE, CAPILLARY: Glucose-Capillary: 82 mg/dL (ref 70–99)

## 2014-09-23 LAB — GLUCOSE, CAPILLARY: Glucose-Capillary: 74 mg/dL (ref 70–99)

## 2014-09-28 ENCOUNTER — Non-Acute Institutional Stay (SKILLED_NURSING_FACILITY): Payer: Medicare Other | Admitting: Internal Medicine

## 2014-09-28 ENCOUNTER — Encounter: Payer: Self-pay | Admitting: Internal Medicine

## 2014-09-28 DIAGNOSIS — F0391 Unspecified dementia with behavioral disturbance: Secondary | ICD-10-CM

## 2014-09-28 DIAGNOSIS — I10 Essential (primary) hypertension: Secondary | ICD-10-CM

## 2014-09-28 DIAGNOSIS — F03918 Unspecified dementia, unspecified severity, with other behavioral disturbance: Secondary | ICD-10-CM

## 2014-09-28 DIAGNOSIS — E875 Hyperkalemia: Secondary | ICD-10-CM

## 2014-09-28 DIAGNOSIS — N289 Disorder of kidney and ureter, unspecified: Secondary | ICD-10-CM

## 2014-09-28 DIAGNOSIS — R627 Adult failure to thrive: Secondary | ICD-10-CM

## 2014-09-28 NOTE — Progress Notes (Signed)
Patient ID: Morgan [name], female   DOB: 1920-12-15, 81 y.o.   MRN: [id number]         This is a routine visit.  Level of care skilled.  Facility [facility].   Chief complaint-medical management of chronic medical issues including renal insufficiency-failure to thrive-anemia Hypertension-dementia.   History of present illness .  Patient is a very pleasant 81 year old female who actually is doing quite stable now for some time.  She really came in with failure to thrive issues but she has responded  well during her stay here.  She has occasionally had a UTI-also has renal insufficiency-she has had a high potassium in the past this appears to have stabilized on routine Kayexalate we will recheck this.  -she is eating and drinking pretty well according to nursing staff--appears she's gained about 3 pounds since September of last year.  She does have a history of normocytic anemia suspect chronic we will recheck this as well.--Last hemoglobin was 9.5 on November 30 Dementia-- would say moderate she does ambulate about the facility is usually pleasant and interactive and quite talkative    She does have a history of hypertension she is on Norvasc and labetalol recent blood pressures appear stable 124/80-122/52  .  Family medical social history as been reviewed her previous progress note as well as discharge summary on 03/19/2013.   Medications have been reviewed per MAR .  Review of systems  in general no complaints of fever chills .  Skin does not complaining of rashes or itching.  Head ears eyes nose mouth and throat has prescription lenses does not complaining of visual changes-- Respiratory no shortness of breath or cough complaints--.  Cardiac no chest pain or significant lower extremity edema.  GI does not complaining of any abdominal discomfort nausea vomiting diarrhea or constipation.  GU history UTIs but has not complained of dysuria tonight.  Musculoskeletal does not complaining of  joint pain.  Neurologic does not complaining of headaches or dizziness or syncopal-type feelings.  Psych is a history of dementia but this appears to be relatively stable is doing very well in this setting  Physical exam.   Temperature 97.1 pulse 78 respirations 16 blood pressure 124/80 weight is 111.4  General this is a pleasant elderly female in no distress lying comfortably in bed.  Her skin is warm and dry.  Eyes she has prescription lenses pupils appear reactive to light sclera and conjunctiva are clear.     Throat--oropharynx clear mucous membranes moist.  Chest is clear to auscultation no labored breathing.  Heart is regular rate and rhythm without murmur gallop or rub.  She does not have significant lower extremity edema.  Abdomen soft nontender with positive bowel sounds.  Musculoskeletal does move all extremities at baseline continues to ambulate about facility in wheelchair.--Continues to have a small lump upper right arm this is nontender non-erythematous appears chronic  Neurologic appears grossly intact speech is clear.  Psych she is oriented to self is pleasant and appropriate at her baseline    .  Labs  07/16/2015.  WBC 3.9 hemoglobin 9.5 platelets 219.  Sodium 140 potassium 4.8 BUN 23 creatinine 1.29  . 03/24/2014.  WBC 3.7-hemoglobin 9.0-platelets 217.  Sodium 140 potassium 4.5 BUN 25 creatinine 1.32.  Liver function tests within normal limits except albumin of 3.3.    01/21/2014.  WBC 3.4 hemoglobin 7.9 platelets 208.  01/18/2014.  Sodium 140 potassium 5 BUN 33 creatinine 1.41.  .  Frequent blood testing in late  May and early June was negative.  01/07/2014.  Iron 59-total iron binding capacity 2:30-vitamin B12 625-folate greater than 20-ferritin elevated at 974.  Reticulocyte 1.5.   Assessment and plan.   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  She is on supplements  .  #2-renal insufficiency this appears to be somewhat variable  will update this   #3-anemia of chronic disease-will update a CBC--her hemoglobin has been as low as 7.9- appears to have rebounded recently   #4-hypertension-at this point appear relatively stable on labetalol as well as Norvasc\  .5-history of hyperkalem ia suspect due to renal insufficiency she is on Kayexalate 3 times a week will update metabolic panel      [id number]

## 2014-09-30 ENCOUNTER — Encounter (HOSPITAL_COMMUNITY)
Admission: RE | Admit: 2014-09-30 | Discharge: 2014-09-30 | Disposition: A | Discharge status: Home / Self Care | Payer: Medicare Other | Source: Skilled Nursing Facility | Attending: Internal Medicine | Admitting: Internal Medicine

## 2014-09-30 DIAGNOSIS — I10 Essential (primary) hypertension: Secondary | ICD-10-CM | POA: Diagnosis not present

## 2014-09-30 DIAGNOSIS — E875 Hyperkalemia: Secondary | ICD-10-CM | POA: Insufficient documentation

## 2014-09-30 LAB — CBC WITH DIFFERENTIAL/PLATELET
Basophils Absolute: 0 K/uL (ref 0.0–0.1)
Basophils Relative: 0 % (ref 0–1)
Eosinophils Absolute: 0.2 K/uL (ref 0.0–0.7)
Eosinophils Relative: 5 % (ref 0–5)
HCT: 27.5 % — ABNORMAL LOW (ref 36.0–46.0)
Hemoglobin: 8.9 g/dL — ABNORMAL LOW (ref 12.0–15.0)
Lymphocytes Relative: 33 % (ref 12–46)
Lymphs Abs: 1.7 K/uL (ref 0.7–4.0)
MCH: 31.9 pg (ref 26.0–34.0)
MCHC: 32.4 g/dL (ref 30.0–36.0)
MCV: 98.6 fL (ref 78.0–100.0)
Monocytes Absolute: 0.6 K/uL (ref 0.1–1.0)
Monocytes Relative: 11 % (ref 3–12)
Neutro Abs: 2.5 K/uL (ref 1.7–7.7)
Neutrophils Relative %: 51 % (ref 43–77)
Platelets: 216 K/uL (ref 150–400)
RBC: 2.79 MIL/uL — ABNORMAL LOW (ref 3.87–5.11)
RDW: 13.1 % (ref 11.5–15.5)
WBC: 5 K/uL (ref 4.0–10.5)

## 2014-09-30 LAB — COMPREHENSIVE METABOLIC PANEL WITH GFR
ALT: 13 U/L (ref 0–35)
AST: 19 U/L (ref 0–37)
Albumin: 3.3 g/dL — ABNORMAL LOW (ref 3.5–5.2)
Alkaline Phosphatase: 96 U/L (ref 39–117)
Anion gap: 4 — ABNORMAL LOW (ref 5–15)
BUN: 29 mg/dL — ABNORMAL HIGH (ref 6–23)
CO2: 26 mmol/L (ref 19–32)
Calcium: 9.1 mg/dL (ref 8.4–10.5)
Chloride: 110 mmol/L (ref 96–112)
Creatinine, Ser: 1.13 mg/dL — ABNORMAL HIGH (ref 0.50–1.10)
GFR calc Af Amer: 48 mL/min — ABNORMAL LOW
GFR calc non Af Amer: 41 mL/min — ABNORMAL LOW
Glucose, Bld: 89 mg/dL (ref 70–99)
Potassium: 4.5 mmol/L (ref 3.5–5.1)
Sodium: 140 mmol/L (ref 135–145)
Total Bilirubin: 0.5 mg/dL (ref 0.3–1.2)
Total Protein: 6.2 g/dL (ref 6.0–8.3)

## 2014-09-30 LAB — GLUCOSE, CAPILLARY: Glucose-Capillary: 68 mg/dL — ABNORMAL LOW (ref 70–99)

## 2014-10-07 LAB — GLUCOSE, CAPILLARY: Glucose-Capillary: 82 mg/dL (ref 70–99)

## 2014-10-22 ENCOUNTER — Encounter: Payer: Self-pay | Admitting: Internal Medicine

## 2014-10-22 ENCOUNTER — Non-Acute Institutional Stay (SKILLED_NURSING_FACILITY): Payer: Medicare Other | Admitting: Internal Medicine

## 2014-10-22 DIAGNOSIS — F03918 Unspecified dementia, unspecified severity, with other behavioral disturbance: Secondary | ICD-10-CM

## 2014-10-22 DIAGNOSIS — F0391 Unspecified dementia with behavioral disturbance: Secondary | ICD-10-CM

## 2014-10-22 DIAGNOSIS — E875 Hyperkalemia: Secondary | ICD-10-CM

## 2014-10-22 DIAGNOSIS — I10 Essential (primary) hypertension: Secondary | ICD-10-CM | POA: Diagnosis not present

## 2014-10-22 DIAGNOSIS — N289 Disorder of kidney and ureter, unspecified: Secondary | ICD-10-CM | POA: Diagnosis not present

## 2014-10-22 NOTE — Progress Notes (Signed)
Patient ID: Morgan [name], female   DOB: April 22, 1921, 81 y.o.   MRN: [id number]   This  is a routine visit.  Level of care skilled.  Facility [facility].   Chief complaint-medical management of chronic medical issues including renal insufficiency-failure to thrive-anemia Hypertension-dementia.   History of present illness .  Patient is a very pleasant 81 year old female who actually has beeng quite stable now for some time.  She really came in with failure to thrive issues but she has responded  well during her stay here.  She has occasionally had a UTI-also has renal insufficiency-she has had a high potassium in the past this appears to have stabilized on routine Kayexalate we will recheck this.  -she is eating and drinking pretty well according to nursing staff--appears she's gained about 3 pounds since September of last year.--And this appears to be holding steady  She does have a history of normocytic anemia suspect chronic we will recheck this as well.--Last hemoglobin was 8.9 on February 15 Dementia-- would say moderate she does ambulate about the facility is usually pleasant and interactive and quite talkative    She does have a history of hypertension she is on Norvasc and labetalol recent blood pressures appear stable taken manually was 126/70-listed ones range from 124/80-158/68-I do not see consistent elevations  .  Family medical social history as been reviewed her previous progress note on 09/28/2014 as well as discharge summary on 03/19/2013.   Medications have been reviewed per MAR .  Review of systems --limited secondary to dementia-obtain from patient and nursing in general no complaints of fever chills .  Skin does not complaining of rashes or itching.  Head ears eyes nose mouth and throat has prescription lenses does not complaining of visual changes-- Respiratory no shortness of breath or cough complaints--.  Cardiac no chest pain or significant lower extremity edema.  GI does  not complaining of any abdominal discomfort nausea vomiting diarrhea or constipation.  GU history UTIs but has not complained of dysuria tonight.  Musculoskeletal does not complaining of joint pain.  Neurologic does not complaining of headaches or dizziness or syncopal-type feelings.  Psych is a history of dementia but this appears to be relatively stable is doing very well in this setting  Physical exam.  T-. 98.0 pulse 70 respirations 16 blood pressure 126/70   General this is a pleasant elderly female in no distress -sitting comfortably in her wheelchair.  Her skin is warm and dry.  Eyes she has prescription lenses pupils appear reactive to light sclera and conjunctiva are clear.     Throat--oropharynx clear mucous membranes moist.  Chest is clear to auscultation no labored breathing.  Heart is regular rate and rhythm without murmur gallop or rub.  She does not have significant lower extremity edema.  Abdomen soft nontender with positive bowel sounds.  Musculoskeletal does move all extremities at baseline continues to ambulate about facility in wheelchair.--Continues to have a small lump upper right arm this is nontender non-erythematous appears chronic  Neurologic appears grossly intact speech is clear.  Psych she is oriented to self is pleasant and appropriate at her baseline    .  Labs   09/30/2014.  Sodium 140 potassium 4.5 BUN 29 creatinine 1.13.  Albumin 3.3 otherwise liver function tests within normal limits.  WBC 5.0 hemoglobin 8.9 platelets 216  07/16/2015.  WBC 3.9 hemoglobin 9.5 platelets 219.  Sodium 140 potassium 4.8 BUN 23 creatinine 1.29  . 03/24/2014.  WBC 3.7-hemoglobin 9.0-platelets 217.  Sodium 140 potassium 4.5 BUN 25 creatinine 1.32.  Liver function tests within normal limits except albumin of 3.3.    01/21/2014.  WBC 3.4 hemoglobin 7.9 platelets 208.  01/18/2014.  Sodium 140 potassium 5 BUN 33 creatinine 1.41.  .  Frequent  blood testing in late May and early June was negative.  01/07/2014.  Iron 59-total iron binding capacity 2:30-vitamin B12 625-folate greater than 20-ferritin elevated at 974.  Reticulocyte 1.5.   Assessment and plan.   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  She is on supplements  .  #2-renal insufficiency this appears to be somewhat variable  will update this-- with history of hyperkalemia   #3-anemia of chronic disease-will update a CBC--her hemoglobin has been as low as 7.9- appears to have stabilized recently   #4-hypertension-at this point appear relatively stable on labetalol as well as Norvasc\  .5-history of hyperkalemia ia suspect due to renal insufficiency she is on Kayexalate 3 times a week will update metabolic panel      [id number]

## 2015-01-29 ENCOUNTER — Encounter (HOSPITAL_COMMUNITY)
Admission: AD | Admit: 2015-01-29 | Discharge: 2015-01-29 | Disposition: A | Discharge status: Home / Self Care | Payer: Medicare Other | Source: Skilled Nursing Facility | Attending: Internal Medicine | Admitting: Internal Medicine

## 2015-01-29 ENCOUNTER — Non-Acute Institutional Stay (SKILLED_NURSING_FACILITY): Payer: Medicare Other | Admitting: Internal Medicine

## 2015-01-29 ENCOUNTER — Encounter: Payer: Self-pay | Admitting: Internal Medicine

## 2015-01-29 DIAGNOSIS — F0391 Unspecified dementia with behavioral disturbance: Secondary | ICD-10-CM

## 2015-01-29 DIAGNOSIS — I10 Essential (primary) hypertension: Secondary | ICD-10-CM

## 2015-01-29 DIAGNOSIS — F03918 Unspecified dementia, unspecified severity, with other behavioral disturbance: Secondary | ICD-10-CM

## 2015-01-29 DIAGNOSIS — N289 Disorder of kidney and ureter, unspecified: Secondary | ICD-10-CM

## 2015-01-29 DIAGNOSIS — E875 Hyperkalemia: Secondary | ICD-10-CM | POA: Diagnosis not present

## 2015-01-29 NOTE — Progress Notes (Signed)
Patient ID: Morgan [name], female   DOB: 1921-04-08, 81 y.o.   MRN: [id number]     This  is a routine visit.  Level of care skilled.  Facility [facility].   Chief complaint-medical management of chronic medical issues including renal insufficiency-failure to thrive-anemia Hypertension-dementia.   History of present illness .  Patient is a very pleasant 81 year old female who actually has  beenquite stable now for some time.  She really came in with failure to thrive issues but she has responded  well during her stay here.  She has occasionally had a UTI-also has renal insufficiency-she has had a high potassium in the past this appears to have stabilized on routine Kayexalate we will recheck this.  -she is eating and drinking pretty well according to nursing staff--  She does have a history of normocytic anemia suspect chronic we will recheck this as well.--Last hemoglobin was 9.5  Dementia-- would say moderate she does ambulate about the facility is usually pleasant and interactive and quite talkative    She does have a history of hypertension she is on Norvasc and labetalol recent blood pressure appears stable most recently 119/71  .  Family medical social history as been reviewed per discharge summary on 03/19/2013.--And recent progress notes including October 22 2014   Medications have been reviewed per [facility]  Include Tylenol 650 mg every 4 hours when necessary pain.  Ativan 0.25 mg once a day when necessary.  Imodium when necessary.  Kayexalate 15 g 3 times a week on Monday and Thursday and Saturday.  Labetalol 200 mg twice a day   Norvasc 5 mg daily Prilosec 20 mg daily .  Review of systems --limited secondary to dementia-obtain from patient and nursing in general no complaints of fever chills .  Skin does not complaining of rashes or itching.  Head ears eyes nose mouth and throat has prescription lenses does not complaining of visual changes-- Respiratory no shortness of breath or  cough complaints--.  Cardiac no chest pain or significant lower extremity edema.  GI does not complaining of any abdominal discomfort nausea vomiting diarrhea or constipation.  GU history UTIs but has not complained of dysuria tonight.  Musculoskeletal does not complaining of joint pain.  Neurologic does not complaining of headaches or dizziness or syncopal-type feelings.  Psych is a history of dementia but this appears to be relatively stable is doing very well in this setting  Physical exam.  Temperature is 97.7 pulse 80 respirations 20 blood pressure 119/71 weight is 114.2--this appears to be stable to a slight weight gain since February of about 3 pounds   General this is a pleasant elderly female in no distress -sitting comfortably in her wheelchair.  Her skin is warm and dry.  Eyes she has prescription lenses pupils appear reactive to light sclera and conjunctiva are clear.     Throat--oropharynx clear mucous membranes moist.  Chest is clear to auscultation no labored breathing.  Heart is regular rate and rhythm without murmur gallop or rub.  She does not have significant lower extremity edema.  Abdomen soft nontender with positive bowel sounds.  Musculoskeletal does move all extremities at baseline continues to ambulate about facility in wheelchair.--Continues to have a small lump upper right arm this is nontender non-erythematous appears chronic  Neurologic appears grossly intact speech is clear.  Psych she is oriented to self is pleasant and appropriate at her baseline--she actually is able to call me by name    .  Labs  09/30/2014.  Sodium 140 potassium 4.5 BUN 29 creatinine 1.13.  Albumin 3.3 otherwise liver function tests within normal limits.  WBC 5.0 hemoglobin 8.9 platelets 216  07/16/2015.  WBC 3.9 hemoglobin 9.5 platelets 219.  Sodium 140 potassium 4.8 BUN 23 creatinine 1.29  . 03/24/2014.  WBC 3.7-hemoglobin 9.0-platelets 217.  Sodium  140 potassium 4.5 BUN 25 creatinine 1.32.  Liver function tests within normal limits except albumin of 3.3.    01/21/2014.  WBC 3.4 hemoglobin 7.9 platelets 208.  01/18/2014.  Sodium 140 potassium 5 BUN 33 creatinine 1.41.  .  Frequent blood testing in late May and early June was negative.  01/07/2014.  Iron 59-total iron binding capacity 2:30-vitamin B12 625-folate greater than 20-ferritin elevated at 974.  Reticulocyte 1.5.   Assessment and plan.   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  She is on supplements She has an Ativan when necessary ordered but I don't believe she uses this much  .  #2-renal insufficiency this appears to be somewhat variable  will update this-- with history of hyperkalemia   #3-anemia of chronic disease-will update a CBC--her hemoglobin has been as low as 7.9- appears to have stabilized recently   #4-hypertension-at this point appear relatively stable on labetalol as well as Norvasc\  .5-history of hyperkalemia ia suspect due to renal insufficiency she is on Kayexalate 3 times a week will update metabolic panel      [id number]

## 2015-01-30 ENCOUNTER — Encounter (HOSPITAL_COMMUNITY)
Admission: AD | Admit: 2015-01-30 | Discharge: 2015-01-30 | Disposition: A | Discharge status: Home / Self Care | Payer: Medicare Other | Source: Skilled Nursing Facility | Attending: Internal Medicine | Admitting: Internal Medicine

## 2015-01-30 LAB — CBC WITH DIFFERENTIAL/PLATELET
BASOS PCT: 1 % (ref 0–1)
Basophils Absolute: 0 10*3/uL (ref 0.0–0.1)
Eosinophils Absolute: 0.3 10*3/uL (ref 0.0–0.7)
Eosinophils Relative: 7 % — ABNORMAL HIGH (ref 0–5)
HCT: 28.8 % — ABNORMAL LOW (ref 36.0–46.0)
Hemoglobin: 9.5 g/dL — ABNORMAL LOW (ref 12.0–15.0)
Lymphocytes Relative: 35 % (ref 12–46)
Lymphs Abs: 1.3 10*3/uL (ref 0.7–4.0)
MCH: 32.5 pg (ref 26.0–34.0)
MCHC: 33 g/dL (ref 30.0–36.0)
MCV: 98.6 fL (ref 78.0–100.0)
Monocytes Absolute: 0.5 10*3/uL (ref 0.1–1.0)
Monocytes Relative: 12 % (ref 3–12)
NEUTROS ABS: 1.8 10*3/uL (ref 1.7–7.7)
Neutrophils Relative %: 45 % (ref 43–77)
Platelets: 250 10*3/uL (ref 150–400)
RBC: 2.92 MIL/uL — ABNORMAL LOW (ref 3.87–5.11)
RDW: 13.1 % (ref 11.5–15.5)
WBC: 3.9 10*3/uL — ABNORMAL LOW (ref 4.0–10.5)

## 2015-01-30 LAB — COMPREHENSIVE METABOLIC PANEL
ALT: 13 U/L — AB (ref 14–54)
AST: 20 U/L (ref 15–41)
Albumin: 3.6 g/dL (ref 3.5–5.0)
Alkaline Phosphatase: 94 U/L (ref 38–126)
Anion gap: 5 (ref 5–15)
BUN: 34 mg/dL — ABNORMAL HIGH (ref 6–20)
CO2: 23 mmol/L (ref 22–32)
CREATININE: 1.36 mg/dL — AB (ref 0.44–1.00)
Calcium: 9.1 mg/dL (ref 8.9–10.3)
Chloride: 108 mmol/L (ref 101–111)
GFR calc Af Amer: 38 mL/min — ABNORMAL LOW (ref >60)
GFR calc non Af Amer: 33 mL/min — ABNORMAL LOW (ref >60)
GLUCOSE: 89 mg/dL (ref 65–99)
Potassium: 5.1 mmol/L (ref 3.5–5.1)
Sodium: 136 mmol/L (ref 135–145)
Total Bilirubin: 0.5 mg/dL (ref 0.3–1.2)
Total Protein: 6.7 g/dL (ref 6.5–8.1)

## 2015-02-12 ENCOUNTER — Other Ambulatory Visit: Payer: Self-pay | Admitting: *Deleted

## 2015-02-12 MED ORDER — LORAZEPAM 0.5 MG PO TABS: ORAL_TABLET | ORAL | Status: DC

## 2015-02-12 NOTE — Telephone Encounter (Signed)
[facility] 

## 2015-04-11 ENCOUNTER — Encounter: Payer: Self-pay | Admitting: Internal Medicine

## 2015-04-11 ENCOUNTER — Non-Acute Institutional Stay (SKILLED_NURSING_FACILITY): Payer: Medicare Other | Admitting: Internal Medicine

## 2015-04-11 DIAGNOSIS — E875 Hyperkalemia: Secondary | ICD-10-CM

## 2015-04-11 DIAGNOSIS — N289 Disorder of kidney and ureter, unspecified: Secondary | ICD-10-CM

## 2015-04-11 DIAGNOSIS — F0391 Unspecified dementia with behavioral disturbance: Secondary | ICD-10-CM

## 2015-04-11 DIAGNOSIS — I10 Essential (primary) hypertension: Secondary | ICD-10-CM

## 2015-04-11 DIAGNOSIS — F03918 Unspecified dementia, unspecified severity, with other behavioral disturbance: Secondary | ICD-10-CM

## 2015-04-11 NOTE — Progress Notes (Signed)
Patient ID: Morgan [name], female   DOB: 1920/12/17, 81 y.o.   MRN: [id number]      This  is a routine visit.  Level of care skilled.  Facility [facility].   Chief complaint-medical management of chronic medical issues including renal insufficiency-failure to thrive-anemia Hypertension-dementia.   History of present illness .  Patient is a very pleasant 81 year old female who actually has  beenquite stable now for some time.  She really came in with failure to thrive issues but she has responded  well during her stay here.  She has occasionally had a UTI-also has renal insufficiency-she has had a high potassium in the past this appears to have stabilized on routine Kayexalate we will recheck this.  -she is eating and drinking pretty well according to nursing staff--  She does have a history of normocytic anemia suspect chronic we will recheck this as well.--Last hemoglobin was 9.5  Dementia-- would say moderate she does ambulate about the facility is usually pleasant and interactive--at times she will have periods of agitation at one point had added Ativan when necessary order but this was discontinued secondary to non use-according in nursing staff there are still times she gets agitated but apparently does better when she is redirected from the source of agitation-    She does have a history of hypertension she is on Norvasc and labetalol recent blood pressure appears stable most recently 129/61-do note occasional systolics in the 0000000 but these are not consistent  .  Family medical social history as been reviewed per discharge summary on 03/19/2013.--And recent progress notes    Medications have been reviewed per [facility]  Include Tylenol 650 mg every 4 hours when necessary pain. .  Imodium when necessary.  Kayexalate 15 g 3 times a week on Monday and Thursday and Saturday.  Labetalol 200 mg twice a day   Norvasc 5 mg daily Prilosec 20 mg daily .  Review of systems --limited secondary to  dementia-obtain from patient and nursing in general no complaints of fever chills .  Skin does not complaining of rashes or itching.  Head ears eyes nose mouth and throat has prescription lenses does not complaining of visual changes-- Respiratory no shortness of breath or cough complaints--.  Cardiac no chest pain or significant lower extremity edema.  GI does not complaining of any abdominal discomfort nausea vomiting diarrhea or constipation.  GU history UTIs but has not complained of dysuria tonight.  Musculoskeletal does not complaining of joint pain.  Neurologic does not complaining of headaches or dizziness or syncopal-type feelings.  Psych is a history of dementia but this appears to be relatively stable is doing very well in this setting--with occasional periods of agitation  Physical exam.  Temperature 97.9 pulse 68 respirations 16 blood pressure 129/61 weight is stable at 112.2   General this is a pleasant elderly female in no distress -currently lying in bed  Her skin is warm and dry.  Eyes she has prescription lenses pupils appear reactive to light sclera and conjunctiva are clear.     Throat--oropharynx clear mucous membranes moist.  Chest is clear to auscultation no labored breathing.  Heart is regular rate and rhythm without murmur gallop or rub.  She does not have significant lower extremity edema.  Abdomen soft nontender with positive bowel sounds.  Musculoskeletal does move all extremities at baseline continues to ambulate about facility in wheelchair.--Continues to have a small lump upper right arm this is nontender non-erythematous appears chronic  Neurologic appears  grossly intact speech is clear.  Psych she is oriented to self is pleasant and appropriate at her baseline--she actually is able to call me by nameat times    .  Labs  01/30/2015.  WBC 3.9 hemoglobin 9.5 platelets 250.  Sodium 136 potassium 5.1 BUN 34 creatinine 1.36-.  Liver  function tests within normal limits albumin 3.6   09/30/2014.  Sodium 140 potassium 4.5 BUN 29 creatinine 1.13.  Albumin 3.3 otherwise liver function tests within normal limits.  WBC 5.0 hemoglobin 8.9 platelets 216  07/16/2015.  WBC 3.9 hemoglobin 9.5 platelets 219.  Sodium 140 potassium 4.8 BUN 23 creatinine 1.29  . 03/24/2014.  WBC 3.7-hemoglobin 9.0-platelets 217.  Sodium 140 potassium 4.5 BUN 25 creatinine 1.32.  Liver function tests within normal limits except albumin of 3.3.    01/21/2014.  WBC 3.4 hemoglobin 7.9 platelets 208.  01/18/2014.  Sodium 140 potassium 5 BUN 33 creatinine 1.41.  .  Frequent blood testing in late May and early June was negative.  01/07/2014.  Iron 59-total iron binding capacity 2:30-vitamin B12 625-folate greater than 20-ferritin elevated at 974.  Reticulocyte 1.5.   Assessment and plan.   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  She is on supplements-- As needed Ativan has been discontinued as noted above   .  #2-renal insufficiency this appears to be somewhat variable  will update this-- with history of hyperkalemia   #3-anemia of chronic disease-will update a CBC--her hemoglobin has been as low as 7.9- appears to have stabilized recently   #4-hypertension-at this point appear relatively stable on labetalol as well as Norvasc\  .5-history of hyperkalemia ia suspect due to renal insufficiency she is on Kayexalate 3 times a week will update metabolic panel      [id number]

## 2015-04-14 ENCOUNTER — Encounter (HOSPITAL_COMMUNITY)
Admission: RE | Admit: 2015-04-14 | Discharge: 2015-04-14 | Disposition: A | Discharge status: Home / Self Care | Payer: Medicare Other | Source: Skilled Nursing Facility | Attending: Internal Medicine | Admitting: Internal Medicine

## 2015-04-14 DIAGNOSIS — R1312 Dysphagia, oropharyngeal phase: Secondary | ICD-10-CM | POA: Insufficient documentation

## 2015-04-14 LAB — CBC WITH DIFFERENTIAL/PLATELET
BASOS PCT: 1 % (ref 0–1)
Basophils Absolute: 0 10*3/uL (ref 0.0–0.1)
EOS ABS: 0.2 10*3/uL (ref 0.0–0.7)
Eosinophils Relative: 5 % (ref 0–5)
HCT: 27.7 % — ABNORMAL LOW (ref 36.0–46.0)
Hemoglobin: 9.3 g/dL — ABNORMAL LOW (ref 12.0–15.0)
Lymphocytes Relative: 32 % (ref 12–46)
Lymphs Abs: 1.5 10*3/uL (ref 0.7–4.0)
MCH: 33 pg (ref 26.0–34.0)
MCHC: 33.6 g/dL (ref 30.0–36.0)
MCV: 98.2 fL (ref 78.0–100.0)
MONOS PCT: 12 % (ref 3–12)
Monocytes Absolute: 0.5 10*3/uL (ref 0.1–1.0)
Neutro Abs: 2.3 10*3/uL (ref 1.7–7.7)
Neutrophils Relative %: 50 % (ref 43–77)
PLATELETS: 234 10*3/uL (ref 150–400)
RBC: 2.82 MIL/uL — ABNORMAL LOW (ref 3.87–5.11)
RDW: 13.3 % (ref 11.5–15.5)
WBC: 4.6 10*3/uL (ref 4.0–10.5)

## 2015-04-14 LAB — BASIC METABOLIC PANEL
Anion gap: 5 (ref 5–15)
BUN: 25 mg/dL — ABNORMAL HIGH (ref 6–20)
CALCIUM: 8.9 mg/dL (ref 8.9–10.3)
CHLORIDE: 108 mmol/L (ref 101–111)
CO2: 26 mmol/L (ref 22–32)
CREATININE: 1.18 mg/dL — AB (ref 0.44–1.00)
GFR, EST AFRICAN AMERICAN: 45 mL/min — AB (ref >60)
GFR, EST NON AFRICAN AMERICAN: 39 mL/min — AB (ref >60)
Glucose, Bld: 83 mg/dL (ref 65–99)
Potassium: 4.7 mmol/L (ref 3.5–5.1)
Sodium: 139 mmol/L (ref 135–145)

## 2015-05-20 ENCOUNTER — Encounter (HOSPITAL_COMMUNITY)
Admission: AD | Admit: 2015-05-20 | Discharge: 2015-05-20 | Disposition: A | Discharge status: Home / Self Care | Payer: Medicare Other | Source: Skilled Nursing Facility | Attending: Internal Medicine | Admitting: Internal Medicine

## 2015-05-20 ENCOUNTER — Encounter: Payer: Self-pay | Admitting: Internal Medicine

## 2015-05-20 ENCOUNTER — Non-Acute Institutional Stay (SKILLED_NURSING_FACILITY): Payer: Medicare Other | Admitting: Internal Medicine

## 2015-05-20 DIAGNOSIS — D649 Anemia, unspecified: Secondary | ICD-10-CM | POA: Insufficient documentation

## 2015-05-20 DIAGNOSIS — N390 Urinary tract infection, site not specified: Secondary | ICD-10-CM | POA: Diagnosis not present

## 2015-05-20 DIAGNOSIS — N289 Disorder of kidney and ureter, unspecified: Secondary | ICD-10-CM | POA: Diagnosis not present

## 2015-05-20 DIAGNOSIS — F03918 Unspecified dementia, unspecified severity, with other behavioral disturbance: Secondary | ICD-10-CM

## 2015-05-20 DIAGNOSIS — F0391 Unspecified dementia with behavioral disturbance: Secondary | ICD-10-CM

## 2015-05-20 DIAGNOSIS — E875 Hyperkalemia: Secondary | ICD-10-CM | POA: Diagnosis not present

## 2015-05-20 DIAGNOSIS — R42 Dizziness and giddiness: Secondary | ICD-10-CM | POA: Insufficient documentation

## 2015-05-20 DIAGNOSIS — I10 Essential (primary) hypertension: Secondary | ICD-10-CM | POA: Insufficient documentation

## 2015-05-20 LAB — URINALYSIS, ROUTINE W REFLEX MICROSCOPIC
BILIRUBIN URINE: NEGATIVE
GLUCOSE, UA: NEGATIVE mg/dL
KETONES UR: NEGATIVE mg/dL
Nitrite: NEGATIVE
PH: 7.5 (ref 5.0–8.0)
PROTEIN: NEGATIVE mg/dL
Specific Gravity, Urine: 1.01 (ref 1.005–1.030)
Urobilinogen, UA: 0.2 mg/dL (ref 0.0–1.0)

## 2015-05-20 LAB — URINE MICROSCOPIC-ADD ON

## 2015-05-20 NOTE — Progress Notes (Signed)
Patient ID: Morgan [name], female   DOB: 24-Aug-1920, 81 y.o.   MRN: [id number]       This  is a routine-acute  visit.  Level of care skilled.  Facility [facility].   Chief complaint-medical management of chronic medical issues including renal insufficiency-failure to thrive-anemia Hypertension-dementia Acute visit secondary to transitory dizziness.   History of present illness .  Patient is a very pleasant 81 year old female who actually has  beenquite stable now for some time.  She really came in with failure to thrive issues but she has responded  well during her stay here.  She has occasionally had a UTI-also has renal insufficiency-she has had a high potassium in the past this appears to have stabilized on routine Kayexalate we will recheck this.  -she is eating and drinking pretty well according to nursing staff--  She does have a history of normocytic anemia suspect chronic we will recheck this as well.--Last hemoglobin was 9.3 on 04/14/2015  Dementia-- would say moderate she does ambulate about the facility is usually pleasant and interactive--at times she will have periods of agitation at one point had added Ativan when necessary order but this was discontinued secondary to non use-according in nursing staff there are still times she gets agitated but apparently does better when she is redirected from the source of agitation this appears to occur more in the early evening hours-at times she will insist she needs to go home  for a ride-    She does have a history of hypertension she is on Norvasc and labetalol --his blood pressures appear to be stable-128/72-119/61-   Today she complained of some dizziness to nursing staff when she was standing up while using the restroom-apparently this was of short duration and she states now  has resolved now that she is sitting down and sitting still.  Per chart review it appears back in April 2015 she did complain of dizziness and actually at that point  was significantly tachycardic--workup in the ER showed a UTI-labwork apparently was unremarkable troponin was unremarkable EKG did not show atrial fibrillation  She is not tachycardic today-her blood pressure sitting is 154/70 did take it standing and got 138/70-she is somewhat excited which could contribute to the somewhat elevated systolics.  Neurologic she appears to be intact and essentially at her baseline alert talking-- slightly confused but pleasant t  .  Family medical social history has been reviewed per discharge summary on 03/19/2013.--And recent progress notes    Medications have been reviewed per [facility]  Include Tylenol 650 mg every 4 hours when necessary pain. .  Imodium when necessary.  Kayexalate 15 g 3 times a week on Monday and Thursday and Saturday.  Labetalol 200 mg twice a day   Norvasc 5 mg daily Prilosec 20 mg daily .  Review of systems --limited secondary to dementia-obtain from patient and nursing in general no complaints of fever chills .  Skin does not complaining of rashes or itching.  Head ears eyes nose mouth and throat has prescription lenses does not complaining of visual changes-- Respiratory no shortness of breath or cough complaints--.  Cardiac no chest pain or significant lower extremity edema.  GI does not complaining of any abdominal discomfort nausea vomiting diarrhea or constipation.  GU history UTIs but did not overtly complain of dysuria today  Musculoskeletal does not complaining of joint pain.  Neurologic does not complaining of headaches --but as noted above has complained of dizziness this was not associated with any  chest pain or shortness of breath or visual changes.  Psych is a history of dementia but this appears to be relatively stable is doing very well in this setting--with occasional periods of agitation  Physical exam T-97.7  P-68 R-24 BP-154/70 Sit  138/70 Standing  General this is a pleasant elderly female in no distress  -sitting comfortably in her wheeelchair  Her skin is warm and dry.  Eyes she has prescription lenses pupils appear reactive to light sclera and conjunctiva are clear.visual acuity appears intact     Throat--oropharynx clear mucous membranes moist.  Chest is clear to auscultation no labored breathing.  Heart is regular rate and rhythm without murmur gallop or rub.  She does not have significant lower extremity edema.  Abdomen soft nontender with positive bowel sounds.  Musculoskeletal does move all extremities at baseline.--Continues to have a small lump upper right arm this is nontender non-erythematous appears chronic  Neurologic appears grossly intact speech is clear strength is intact at baseline all 4 extremities her speech is clear tongue is midline.-Cranial nerves are intact  Psych she is oriented to self is pleasant and appropriate at her baseline--    .  Labs  04/14/2015.  Sodium 139 potassium 4.7 BUN 25 creatinine 1.18.  WBC 4.6 hemoglobin 9.3 platelets 234.    01/30/2015.  WBC 3.9 hemoglobin 9.5 platelets 250.  Sodium 136 potassium 5.1 BUN 34 creatinine 1.36-.  Liver function tests within normal limits albumin 3.6   09/30/2014.  Sodium 140 potassium 4.5 BUN 29 creatinine 1.13.  Albumin 3.3 otherwise liver function tests within normal limits.  WBC 5.0 hemoglobin 8.9 platelets 216  07/16/2015.  WBC 3.9 hemoglobin 9.5 platelets 219.  Sodium 140 potassium 4.8 BUN 23 creatinine 1.29  . 03/24/2014.  WBC 3.7-hemoglobin 9.0-platelets 217.  Sodium 140 potassium 4.5 BUN 25 creatinine 1.32.  Liver function tests within normal limits except albumin of 3.3.    01/21/2014.  WBC 3.4 hemoglobin 7.9 platelets 208.  01/18/2014.  Sodium 140 potassium 5 BUN 33 creatinine 1.41.  .  Frequent blood testing in late May and early June was negative.  01/07/2014.  Iron 59-total iron binding capacity 2:30-vitamin B12 625-folate greater than 20-ferritin  elevated at 974.  Reticulocyte 1.5.   Assessment and plan.  #1-dizziness-at this point appears to have resolved-again she had a fairly extensive workup in the ER about a year ago and that time she was tachycardic that is not the case today she is stable physical exam is at baseline-orthostatic blood pressure did not show a big drop in systolic readings not low enough to cause orthostatic symptoms one would think-at this point will update an EKG-also will order lab work including a CBC CMP TSH as well as troponin level again she is not complaining of any shortness of breath or chest pain.  Also monitor closely vital signs pulse ox neuro checks every 2 hours 2 every 4 hours 2 and then every shift.  Also will obtain a urinalysis and culture     #2-dementia with failure to thrive-this appears to be quite stable she is functioning well in this setting continue supportive care apparently she is eating and drinking fairly well --weights stable.  She is on supplements-   .  #3-renal insufficiency this appears to be somewhat variable  will update this-- with history of hyperkalemia   #4-anemia of chronic disease-will update a CBC--her hemoglobin has been as low as 7.9- appears to have stabilized recently   #5-hypertension-at this point appear relatively  stable on labetalol as well as Norvasc-she is somewhat elevated today although this could be due to excitement  at this point will monitor as noted above\  .6-history of hyperkalemia ia suspect due to renal insufficiency she is on Kayexalate 3 times a week will update metabolic panel     Update-EKG shows normal sinus rhythm I do not see any acute changes-again at this point will monitor and await updated labs-she appears to be at her baseline I did reevaluate patient 2 later in the day and she was stable with unchanged physical exam results   [id number]-of note greater than 45 minutes spent assessing patient reassessing patient-reviewing her  chart-discussing her status with nursing staff-and coordinating and formulating a plan of care-of note greater than 50% of time spent coordinating plan of care

## 2015-05-21 ENCOUNTER — Encounter (HOSPITAL_COMMUNITY)
Admission: RE | Admit: 2015-05-21 | Discharge: 2015-05-21 | Disposition: A | Discharge status: Home / Self Care | Payer: Medicare Other | Source: Skilled Nursing Facility | Attending: Internal Medicine | Admitting: Internal Medicine

## 2015-05-21 DIAGNOSIS — N390 Urinary tract infection, site not specified: Secondary | ICD-10-CM | POA: Diagnosis not present

## 2015-05-21 LAB — COMPREHENSIVE METABOLIC PANEL
ALBUMIN: 3.6 g/dL (ref 3.5–5.0)
ALK PHOS: 87 U/L (ref 38–126)
ALT: 12 U/L — ABNORMAL LOW (ref 14–54)
AST: 20 U/L (ref 15–41)
Anion gap: 5 (ref 5–15)
BUN: 39 mg/dL — ABNORMAL HIGH (ref 6–20)
CALCIUM: 8.8 mg/dL — AB (ref 8.9–10.3)
CHLORIDE: 107 mmol/L (ref 101–111)
CO2: 25 mmol/L (ref 22–32)
CREATININE: 1.39 mg/dL — AB (ref 0.44–1.00)
GFR calc Af Amer: 37 mL/min — ABNORMAL LOW (ref >60)
GFR calc non Af Amer: 32 mL/min — ABNORMAL LOW (ref >60)
GLUCOSE: 88 mg/dL (ref 65–99)
Potassium: 4.7 mmol/L (ref 3.5–5.1)
SODIUM: 137 mmol/L (ref 135–145)
Total Bilirubin: 0.5 mg/dL (ref 0.3–1.2)
Total Protein: 6.7 g/dL (ref 6.5–8.1)

## 2015-05-21 LAB — CBC WITH DIFFERENTIAL/PLATELET
Basophils Absolute: 0.1 10*3/uL (ref 0.0–0.1)
Basophils Relative: 1 %
Eosinophils Absolute: 0.2 10*3/uL (ref 0.0–0.7)
Eosinophils Relative: 4 %
HEMATOCRIT: 29 % — AB (ref 36.0–46.0)
HEMOGLOBIN: 9.5 g/dL — AB (ref 12.0–15.0)
LYMPHS PCT: 27 %
Lymphs Abs: 1.5 10*3/uL (ref 0.7–4.0)
MCH: 32.2 pg (ref 26.0–34.0)
MCHC: 32.8 g/dL (ref 30.0–36.0)
MCV: 98.3 fL (ref 78.0–100.0)
MONO ABS: 0.6 10*3/uL (ref 0.1–1.0)
MONOS PCT: 11 %
NEUTROS ABS: 3.3 10*3/uL (ref 1.7–7.7)
NEUTROS PCT: 57 %
Platelets: 229 10*3/uL (ref 150–400)
RBC: 2.95 MIL/uL — ABNORMAL LOW (ref 3.87–5.11)
RDW: 13 % (ref 11.5–15.5)
WBC: 5.8 10*3/uL (ref 4.0–10.5)

## 2015-05-21 LAB — TROPONIN I: Troponin I: <0.03 ng/mL (ref <0.031)

## 2015-05-21 LAB — TSH: TSH: 2.336 u[IU]/mL (ref 0.350–4.500)

## 2015-05-24 LAB — URINE CULTURE: Culture: >=100000

## 2015-05-28 ENCOUNTER — Non-Acute Institutional Stay (SKILLED_NURSING_FACILITY): Payer: Medicare Other | Admitting: Internal Medicine

## 2015-05-28 ENCOUNTER — Encounter: Payer: Self-pay | Admitting: Internal Medicine

## 2015-05-28 DIAGNOSIS — N10 Acute tubulo-interstitial nephritis: Secondary | ICD-10-CM | POA: Diagnosis not present

## 2015-05-28 DIAGNOSIS — J3089 Other allergic rhinitis: Secondary | ICD-10-CM | POA: Diagnosis not present

## 2015-05-28 NOTE — Progress Notes (Signed)
Patient ID: Morgan [name], female   DOB: 12-Feb-1921, 81 y.o.   MRN: [id number]        This  is an acute  visit.  Level of care skilled.  Facility [facility].   Chief complaint- Acute visit secondary to stuffy nose-allergy symptoms-UTI.   History of present illness .  Patient is a very pleasant 81 year old female who actually has  beenquite stable now for some time.  She really came in with failure to thrive issues but she has responded  well during her stay here.  She has occasionally had a UTI-also has renal insufficiency-she has had a high potassium in the past this appears to have stabilized on routine Kayexalate  This is been stable for some time recent potassium 4.7 on October 5.  Last week she was complaining of some dizziness however workup was essentially negative troponin was negative TSH was within normal limits and this was of short duration in fact later patient denied feeling dizzy.  Labs were ordered including a urinalysis and culture which per chart review appears to have grown out 2 organisms Klebsiella pneumonia and Escherichia coli-both of these are sensitive to Septra.  Today her only complaint is runny eyes and nose staff knows she states she has "hayfever".  Her vital signs are stable she is afebrile--does not really complain of dysuria today she is a poor historian however-     t  .  Family medical social history has been reviewed per discharge summary on 03/19/2013.-   Medications have been reviewed per [facility]  Include Tylenol 650 mg every 4 hours when necessary pain. .  Imodium when necessary.  Kayexalate 15 g 3 times a week on Monday and Thursday and Saturday.  Labetalol 200 mg twice a day   Norvasc 5 mg daily   Prilosec 20 mg daily .  Review of systems --limited secondary to dementia-obtain from patient and nursing in general no complaints of fever chills .  Skin does not complaining of rashes or itching.  Head ears eyes nose mouth and throat has  prescription lenses does not complaining of visual changes--complains of stuffy runny nose and runny eyes she attributes to hayfever Respiratory no shortness of breath or cough complaints--.  Cardiac no chest pain or significant lower extremity edema.  GI does not complaining of any abdominal discomfort nausea vomiting diarrhea or constipation.  GU history UTIs but did not overtly complain of dysuria today  Musculoskeletal does not complaining of joint pain.  Neurologic does not complaining of headaches -- Or dizziness.  Psych is a history of dementia but this appears to be relatively stable is doing very well in this setting--with occasional periods of agitation  Physical exam Temperature 97.0 pulse 76 respirations 20 blood pressure 124/73 O2 saturation 96% on room air  General this is a pleasant elderly female in no distress -sitting comfortably in her wheeelchair  Her skin is warm and dry.  Eyes she has prescription lenses pupils appear reactive to light sclera and conjunctiva are clear.visual acuity appears intact--appear to have some clear drainage.  Nose again some clear drainage noted     Throat--oropharynx clear mucous membranes moist.  Chest is clear to auscultation no labored breathing.  Heart is regular rate and rhythm without murmur gallop or rub.  She does not have significant lower extremity edema.  Abdomen soft nontender with positive bowel sounds . GU Could not really appreciate any overt suprapubic tenderness  Musculoskeletal does move all extremities at baseline.--Continues to have a  small lump upper right arm this is nontender non-erythematous appears chronic  Neurologic appears grossly intact speech is clear strength is intact at baseline all 4 extremities her speech is clear tongue is midline.-Cranial nerves are intact  Psych she is oriented to self is pleasant and appropriate at her baseline--    .  Labs  05/21/2015.  Sodium 137 potassium 4.7  BUN 39 creatinine 1.39.  ALT 12 otherwise liver function tests within normal limits.  WBC 5.8 hemoglobin 9.5 platelets 229.  Troponin less than 0.03.  TSH 2.336.  Urine culture as noted above  04/14/2015.  Sodium 139 potassium 4.7 BUN 25 creatinine 1.18.  WBC 4.6 hemoglobin 9.3 platelets 234.    01/30/2015.  WBC 3.9 hemoglobin 9.5 platelets 250.  Sodium 136 potassium 5.1 BUN 34 creatinine 1.36-.  Liver function tests within normal limits albumin 3.6   09/30/2014.  Sodium 140 potassium 4.5 BUN 29 creatinine 1.13.  Albumin 3.3 otherwise liver function tests within normal limits.  WBC 5.0 hemoglobin 8.9 platelets 216  07/16/2015.  WBC 3.9 hemoglobin 9.5 platelets 219.  Sodium 140 potassium 4.8 BUN 23 creatinine 1.29  . 03/24/2014.  WBC 3.7-hemoglobin 9.0-platelets 217.  Sodium 140 potassium 4.5 BUN 25 creatinine 1.32.  Liver function tests within normal limits except albumin of 3.3.    01/21/2014.  WBC 3.4 hemoglobin 7.9 platelets 208.  01/18/2014.  Sodium 140 potassium 5 BUN 33 creatinine 1.41.  .  Frequent blood testing in late May and early June was negative.  01/07/2014.  Iron 59-total iron binding capacity 2:30-vitamin B12 625-folate greater than 20-ferritin elevated at 974.  Reticulocyte 1.5.   Assessment and plan.  Allergy symptoms-will treat with Flonase 1 spray each nostril twice a day for 7 days also Claritin 10 mg daily for 7 days and monitor I do not note any chest congestion or cough clinically she appears to be stable monitor vital signs pulse ox every shift for 72 hours.  #2 UTI-with multiple organisms will treat with Bactrim which is effective against both the Escherichia coli and Klebsiella--we will do a seven-day course    [phone]

## 2015-06-04 ENCOUNTER — Encounter (HOSPITAL_COMMUNITY)
Admission: AD | Admit: 2015-06-04 | Discharge: 2015-06-04 | Disposition: A | Discharge status: Home / Self Care | Payer: Medicare Other | Source: Skilled Nursing Facility | Attending: Internal Medicine | Admitting: Internal Medicine

## 2015-06-04 ENCOUNTER — Encounter: Payer: Self-pay | Admitting: Internal Medicine

## 2015-06-04 ENCOUNTER — Non-Acute Institutional Stay (SKILLED_NURSING_FACILITY): Payer: Medicare Other | Admitting: Internal Medicine

## 2015-06-04 DIAGNOSIS — N289 Disorder of kidney and ureter, unspecified: Secondary | ICD-10-CM

## 2015-06-04 DIAGNOSIS — E875 Hyperkalemia: Secondary | ICD-10-CM

## 2015-06-04 DIAGNOSIS — N390 Urinary tract infection, site not specified: Secondary | ICD-10-CM | POA: Diagnosis not present

## 2015-06-04 DIAGNOSIS — J3089 Other allergic rhinitis: Secondary | ICD-10-CM | POA: Diagnosis not present

## 2015-06-04 LAB — BASIC METABOLIC PANEL
ANION GAP: 7 (ref 5–15)
BUN: 43 mg/dL — ABNORMAL HIGH (ref 6–20)
CALCIUM: 9.5 mg/dL (ref 8.9–10.3)
CO2: 19 mmol/L — ABNORMAL LOW (ref 22–32)
CREATININE: 2.03 mg/dL — AB (ref 0.44–1.00)
Chloride: 106 mmol/L (ref 101–111)
GFR, EST AFRICAN AMERICAN: 23 mL/min — AB (ref >60)
GFR, EST NON AFRICAN AMERICAN: 20 mL/min — AB (ref >60)
Glucose, Bld: 97 mg/dL (ref 65–99)
Potassium: 6 mmol/L — ABNORMAL HIGH (ref 3.5–5.1)
SODIUM: 132 mmol/L — AB (ref 135–145)

## 2015-06-04 NOTE — Progress Notes (Signed)
Patient ID: Valeria Grice, female   DOB: 08/04/1921, 81 y.o.   MRN: [id number]        This  is an acute  visit.  Level of care skilled.  Facility [facility].   Chief complaint--acute visit secondary to multiple issues including follow-up allergic rhinitis-UTI-hyperkalemia with history of worsening renal insufficiency    History of present illness .  Patient is a very pleasant 81 year old female who actually has  beenquite stable now for some time.  She really came in with failure to thrive issues but she has responded  well during her stay here.  She has occasionally had a UTI-also has renal insufficiency-she has had a high potassium in the past  I recently saw for suspected allergic rhinitis he has some nasal congestion and stuffiness she did complete a course of Flonase and Claritin but apparently she is still having symptoms.  She does not complaining any acute shortness of breath however.  I did order follow-up labs as well which were surprising showing that her creatinine had risen to 2.03 she has been fairly stable in fact most recent creatinine previously on October 5 was 1.39 which is been close to her recent baseline although she has had some variability Her potassium also was up at 6.0 again she is receiving Kayexalate 15 g 3 times a week.  I do note she has just finished a course of Septra for UTI for multiple organisms she is not complaining of any dysuria today.  There is some suspicion possibly the Septra may be contributing to this renal insufficiency.  According to nursing staff she is eating and drinking at baseline in fact when I saw her today she had a glass of water in her hand      t  .  Family medical social history has been reviewed per discharge summary on 03/19/2013.--And recent progress notes including October 4 and 05/28/2015   Medications have been reviewed per [facility]  Include Tylenol 650 mg every 4 hours when necessary pain. .  Imodium when  necessary.  Kayexalate 15 g 3 times a week on Monday and Thursday and Saturday.  Labetalol 200 mg twice a day   Norvasc 5 mg daily   Prilosec 20 mg daily .  Review of systems --limited secondary to dementia-obtain from patient and nursing in general no complaints of fever chills .  Skin does not complaining of rashes or itching.  Head ears eyes nose mouth and throat has prescription lenses does not complaining of visual changes--continues with some clear nasal drainage nasal congestion Respiratory no shortness of breath or cough complaints---.  Cardiac no chest pain or significant lower extremity edema.  GI does not complaining of any abdominal discomfort nausea vomiting diarrhea or constipation.  GU history UTIs but did not overtly complain of dysuria today is finished treatment for a UTI --Klebsiella and Escherichia coli  Musculoskeletal does not complaining of joint pain.  Neurologic does not complaining of headaches or dizziness today again she was seen for dizziness earlier this month but workup was essentially negative and there been no further episodes-in fact she later denied the dizziness   Psych is a history of dementia but this appears to be relatively stable is doing very well in this setting--with occasional periods of agitation  Physical exam Temperature 97.3 pulse 80 respirations 24 blood pressure 103/61 O2 saturation is in the 90s on room air  General this is a pleasant elderly female in no distress -sitting comfortably in her wheeelchair  Her skin is warm and dry.  Eyes  And nose she has prescription lenses pupils appear reactive to light sclera and conjunctiva are clear but she has some nasal stuffiness with clear drainage     Throat--oropharynx clear mucous membranes moist.  Chest is clear to auscultation no labored breathing.  Heart is regular rate and rhythm without murmur gallop or rub.  She does not have significant lower extremity edema.  Abdomen  soft nontender with positive bowel sounds.  Musculoskeletal does move all extremities at baseline.--Continues to have a small lump upper right arm this is nontender non-erythematous appears chronic  Neurologic appears grossly intact speech is clear strength is intact at baseline all 4 extremities her speech is clear tongue is midline.-Cranial nerves are intact  Psych she is oriented to self is pleasant and appropriate at her baseline--    .  Labs  06/04/2015.  Sodium 132 potassium 6 BUN 43 creatinine 2.03.  05/21/2015.  Sodium 137 potassium 4.7 BUN 39 creatinine 1.39.  WBC 5.8 hemoglobin 9.5 platelets 229  04/14/2015.  Sodium 139 potassium 4.7 BUN 25 creatinine 1.18.  WBC 4.6 hemoglobin 9.3 platelets 234.    01/30/2015.  WBC 3.9 hemoglobin 9.5 platelets 250.  Sodium 136 potassium 5.1 BUN 34 creatinine 1.36-.  Liver function tests within normal limits albumin 3.6   09/30/2014.  Sodium 140 potassium 4.5 BUN 29 creatinine 1.13.  Albumin 3.3 otherwise liver function tests within normal limits.  WBC 5.0 hemoglobin 8.9 platelets 216  07/16/2015.  WBC 3.9 hemoglobin 9.5 platelets 219.  Sodium 140 potassium 4.8 BUN 23 creatinine 1.29  . 03/24/2014.  WBC 3.7-hemoglobin 9.0-platelets 217.  Sodium 140 potassium 4.5 BUN 25 creatinine 1.32.  Liver function tests within normal limits except albumin of 3.3.    01/21/2014.  WBC 3.4 hemoglobin 7.9 platelets 208.  01/18/2014.  Sodium 140 potassium 5 BUN 33 creatinine 1.41.  .  Frequent blood testing in late May and early June was negative.  01/07/2014.  Iron 59-total iron binding capacity 2:30-vitamin B12 625-folate greater than 20-ferritin elevated at 974.  Reticulocyte 1.5.   Assessment and plan.  Renal insufficiency with elevated potassium-patient does have a history of this but this has been markedly stable for quite a while-one would wonder possibly of Septra contributed to this-clinically she appears  stable will give her a liter of IV normal saline and start Kayexalate every day for now - updated basic metabolic panel on Friday-clinically she appears to be stable.--This was discussed with Dr. Dellia Nims  #2 history of allergic rhinitis will switch her antihistamine she had been on Claritin and completed this will start Zyrtec and continue the Flonase for 5 additional days and continue to monitor she does not appear to have any significant respiratory congestion or extension beyond  the nasal  area but this will have to be watched.  #3 UTI this appears to resolved is not complaining of any dysuria-again she has completed the Septra I suspect we will try to avoid Septra in the future secondary to possible renal complications here  [id number].  Marland Kitchen

## 2015-06-06 ENCOUNTER — Encounter (HOSPITAL_COMMUNITY)
Admission: AD | Admit: 2015-06-06 | Discharge: 2015-06-06 | Disposition: A | Discharge status: Home / Self Care | Payer: Medicare Other | Source: Skilled Nursing Facility | Attending: Internal Medicine | Admitting: Internal Medicine

## 2015-06-06 ENCOUNTER — Non-Acute Institutional Stay (SKILLED_NURSING_FACILITY): Payer: Medicare Other | Admitting: Internal Medicine

## 2015-06-06 DIAGNOSIS — N390 Urinary tract infection, site not specified: Secondary | ICD-10-CM | POA: Diagnosis not present

## 2015-06-06 DIAGNOSIS — N179 Acute kidney failure, unspecified: Secondary | ICD-10-CM | POA: Diagnosis not present

## 2015-06-06 DIAGNOSIS — E875 Hyperkalemia: Secondary | ICD-10-CM

## 2015-06-06 LAB — CBC WITH DIFFERENTIAL/PLATELET
Band Neutrophils: 0 %
Basophils Absolute: 0 10*3/uL (ref 0.0–0.1)
Basophils Relative: 0 %
Blasts: 0 %
Eosinophils Absolute: 0.2 10*3/uL (ref 0.0–0.7)
Eosinophils Relative: 5 %
HCT: 28.2 % — ABNORMAL LOW (ref 36.0–46.0)
Hemoglobin: 9.2 g/dL — ABNORMAL LOW (ref 12.0–15.0)
Lymphocytes Relative: 53 %
Lymphs Abs: 2.4 10*3/uL (ref 0.7–4.0)
MCH: 32.4 pg (ref 26.0–34.0)
MCHC: 32.6 g/dL (ref 30.0–36.0)
MCV: 99.3 fL (ref 78.0–100.0)
Metamyelocytes Relative: 0 %
Monocytes Absolute: 0.2 10*3/uL (ref 0.1–1.0)
Monocytes Relative: 5 %
Myelocytes: 0 %
Neutro Abs: 1.6 10*3/uL — ABNORMAL LOW (ref 1.7–7.7)
Neutrophils Relative %: 37 %
Other: 0 %
Platelets: 302 10*3/uL (ref 150–400)
Promyelocytes Absolute: 0 %
RBC: 2.84 MIL/uL — ABNORMAL LOW (ref 3.87–5.11)
RDW: 13.1 % (ref 11.5–15.5)
WBC: 4.4 10*3/uL (ref 4.0–10.5)
nRBC: 0 /100{WBCs}

## 2015-06-06 LAB — BASIC METABOLIC PANEL WITH GFR
Anion gap: 6 (ref 5–15)
BUN: 44 mg/dL — ABNORMAL HIGH (ref 6–20)
CO2: 21 mmol/L — ABNORMAL LOW (ref 22–32)
Calcium: 9.3 mg/dL (ref 8.9–10.3)
Chloride: 108 mmol/L (ref 101–111)
Creatinine, Ser: 2 mg/dL — ABNORMAL HIGH (ref 0.44–1.00)
GFR calc Af Amer: 24 mL/min — ABNORMAL LOW
GFR calc non Af Amer: 21 mL/min — ABNORMAL LOW
Glucose, Bld: 89 mg/dL (ref 65–99)
Potassium: 5.4 mmol/L — ABNORMAL HIGH (ref 3.5–5.1)
Sodium: 135 mmol/L (ref 135–145)

## 2015-06-06 NOTE — Progress Notes (Signed)
Patient ID: Morgan [name], female   DOB: 28-Oct-1920, 81 y.o.   MRN: [id number]         This  is an acute  visit.  Level of care skilled.  Facility [facility].   Chief complaint- Acute visit follow-up hyperkalemia-renal insufficiency    History of present illness .  Patient is a very pleasant 81 year old female who actually has  beenquite stable now for some time.  She really came in with failure to thrive issues but she has responded  well during her stay here.  She has occasionally had a UTI-also has renal insufficiency-she has had a high potassium in the past  I recently saw for suspected allergic rhinitis We've made a few adjustments here and this appears to have largely resolved he has been on an antihistamine.  She does not complaining any acute shortness of breath.  I did order follow-up labs as well which were surprising showing that her creatinine had risen to 2.03 she has been fairly stable in fact  recent creatinine previously on October 5 was 1.39 which is been close to her recent baseline although she has had some variability Her potassium also was up at 6.0 again she was receiving Kayexalate 15 g 3 times a week.  She has received a liter of normal saline and neck should doses of Kayexalate and appears her potassium is trending down at 5.4 today creatinine still was 2.00.  According nursing staff she continues to eat and drink well.  I do not she had been on Septra for UTI and there is some suspicion this may be contributing to her  renal insufficiency.  Clinically she appears to be stable  I do note she has just finished a course of Septra for UTI for multiple organisms she is not complaining of any dysuria today.  There is some suspicion possibly the Septra may be contributing to this renal insufficiency.  According to nursing staff she is eating and drinking at baseline in fact when I saw her today she had a glass of water in her hand      t  .  Family medical  social history has been reviewed per discharge summary on 03/19/2013.--And recent progress notes including October 4 and 05/28/2015   Medications have been reviewed per [facility]  Include Tylenol 650 mg every 4 hours when necessary pain. .  Imodium when necessary.  Kayexalate 15 g 3 times a week on Monday and Thursday and Saturday.  Labetalol 200 mg twice a day   Norvasc 5 mg daily   Prilosec 20 mg daily .  Review of systems --limited secondary to dementia-obtain from patient and nursing in general no complaints of fever chills .  Skin does not complaining of rashes or itching.  Head ears eyes nose mouth and throat has prescription lenses does not complaining of visual changes--nasal congestion appears to be resolving as well as allergy symptoms n Respiratory no shortness of breath or cough complaints---.  Cardiac no chest pain or significant lower extremity edema.  GI does not complaining of any abdominal discomfort nausea vomiting diarrhea or constipation.  GU history UTIs but did not overtly complain of dysuria today has finished treatment for a UTI --Klebsiella and Escherichia coli  Musculoskeletal does not complaining of joint pain.  Neurologic does not complaining of headaches or dizziness today again she was seen for dizziness earlier this month but workup was essentially negative and there been no further episodes-in fact she later denied the dizziness  Psych is a history of dementia but this appears to be relatively stable is doing very well in this setting--with occasional periods of agitation  Physical exam   She is afebrile pulse of 80 respirations 17  General this is a pleasant elderly female in no distress -sitting comfortably in her wheeelchair  Her skin is warm and dry.  Eyes  And nose she has prescription lenses pupils appear reactive to light sclera and conjunctiva are clear but she has some nasal stuffiness with clear drainage     Throat--oropharynx clear  mucous membranes moist.  Chest is clear to auscultation no labored breathing.  Heart is regular rate and rhythm without murmur gallop or rub.  She does not have significant lower extremity edema.  Abdomen soft nontender with positive bowel sounds.  Musculoskeletal does move all extremities at baseline.--Continues to have a small lump upper right arm this is nontender non-erythematous appears chronic  Neurologic appears grossly intact speech is clear strength is intact at baseline all 4 extremities her speech is clear tongue is midline.-Cranial nerves are intact  Psych she is oriented to self is pleasant and appropriate at her baseline--    .  Labs  06/06/2015.  Sodium 135 potassium 5.4 BUN 44 creatinine 2.00  06/04/2015.  Sodium 132 potassium 6 BUN 43 creatinine 2.03.  05/21/2015.  Sodium 137 potassium 4.7 BUN 39 creatinine 1.39.  WBC 5.8 hemoglobin 9.5 platelets 229  04/14/2015.  Sodium 139 potassium 4.7 BUN 25 creatinine 1.18.  WBC 4.6 hemoglobin 9.3 platelets 234.    01/30/2015.  WBC 3.9 hemoglobin 9.5 platelets 250.  Sodium 136 potassium 5.1 BUN 34 creatinine 1.36-.  Liver function tests within normal limits albumin 3.6   09/30/2014.  Sodium 140 potassium 4.5 BUN 29 creatinine 1.13.  Albumin 3.3 otherwise liver function tests within normal limits.  WBC 5.0 hemoglobin 8.9 platelets 216  07/16/2015.  WBC 3.9 hemoglobin 9.5 platelets 219.  Sodium 140 potassium 4.8 BUN 23 creatinine 1.29  . 03/24/2014.  WBC 3.7-hemoglobin 9.0-platelets 217.  Sodium 140 potassium 4.5 BUN 25 creatinine 1.32.  Liver function tests within normal limits except albumin of 3.3.    01/21/2014.  WBC 3.4 hemoglobin 7.9 platelets 208.  01/18/2014.  Sodium 140 potassium 5 BUN 33 creatinine 1.41.  .  Frequent blood testing in late May and early June was negative.  01/07/2014.  Iron 59-total iron binding capacity 2:30-vitamin B12 625-folate greater than 20-ferritin  elevated at 974.  Reticulocyte 1.5.   Assessment and plan.  Renal insufficiency with elevated potassium-hyperkalemia appears to be resolving and she has received extra Kayexalate suspect she may need Kayexalate now every day-renal insufficiency still appears to be somewhat above her baseline Will give her an extra liter of normal saline at 60 mL an hour and recheck a metabolic panel over the weekend-.  In regards to allergic rhinitis this appears to have largely resolved   [id number].  Marland Kitchen

## 2015-06-09 ENCOUNTER — Encounter (HOSPITAL_COMMUNITY)
Admission: RE | Admit: 2015-06-09 | Discharge: 2015-06-09 | Disposition: A | Discharge status: Home / Self Care | Payer: Medicare Other | Source: Skilled Nursing Facility | Attending: Internal Medicine | Admitting: Internal Medicine

## 2015-06-09 DIAGNOSIS — N390 Urinary tract infection, site not specified: Secondary | ICD-10-CM | POA: Diagnosis not present

## 2015-06-09 LAB — BASIC METABOLIC PANEL
ANION GAP: 4 — AB (ref 5–15)
BUN: 31 mg/dL — AB (ref 6–20)
CHLORIDE: 112 mmol/L — AB (ref 101–111)
CO2: 22 mmol/L (ref 22–32)
Calcium: 9.1 mg/dL (ref 8.9–10.3)
Creatinine, Ser: 1.27 mg/dL — ABNORMAL HIGH (ref 0.44–1.00)
GFR calc Af Amer: 41 mL/min — ABNORMAL LOW (ref >60)
GFR, EST NON AFRICAN AMERICAN: 36 mL/min — AB (ref >60)
GLUCOSE: 88 mg/dL (ref 65–99)
POTASSIUM: 3.9 mmol/L (ref 3.5–5.1)
Sodium: 138 mmol/L (ref 135–145)

## 2015-06-16 ENCOUNTER — Encounter (HOSPITAL_COMMUNITY)
Admission: RE | Admit: 2015-06-16 | Discharge: 2015-06-16 | Disposition: A | Discharge status: Home / Self Care | Payer: Medicare Other | Source: Skilled Nursing Facility | Attending: Internal Medicine | Admitting: Internal Medicine

## 2015-06-16 ENCOUNTER — Non-Acute Institutional Stay (SKILLED_NURSING_FACILITY): Payer: Medicare Other | Admitting: Internal Medicine

## 2015-06-16 DIAGNOSIS — N183 Chronic kidney disease, stage 3 unspecified: Secondary | ICD-10-CM

## 2015-06-16 DIAGNOSIS — E876 Hypokalemia: Secondary | ICD-10-CM

## 2015-06-16 DIAGNOSIS — N390 Urinary tract infection, site not specified: Secondary | ICD-10-CM | POA: Diagnosis not present

## 2015-06-16 DIAGNOSIS — N179 Acute kidney failure, unspecified: Secondary | ICD-10-CM

## 2015-06-16 LAB — BASIC METABOLIC PANEL
Anion gap: 3 — ABNORMAL LOW (ref 5–15)
BUN: 22 mg/dL — AB (ref 6–20)
CHLORIDE: 107 mmol/L (ref 101–111)
CO2: 25 mmol/L (ref 22–32)
Calcium: 8.5 mg/dL — ABNORMAL LOW (ref 8.9–10.3)
Creatinine, Ser: 1.04 mg/dL — ABNORMAL HIGH (ref 0.44–1.00)
GFR calc Af Amer: 52 mL/min — ABNORMAL LOW (ref >60)
GFR calc non Af Amer: 45 mL/min — ABNORMAL LOW (ref >60)
GLUCOSE: 88 mg/dL (ref 65–99)
POTASSIUM: 2.6 mmol/L — AB (ref 3.5–5.1)
Sodium: 135 mmol/L (ref 135–145)

## 2015-06-16 NOTE — Progress Notes (Signed)
Patient ID: Morgan [name], female   DOB: 09-03-1920, 81 y.o.   MRN: [id number]   Facility; [facility] Chief complaint; K+ equal 2.6 History; this is an elderly woman with chronic renal insufficiency. She also has a history of hyperkalemia and until recently had been on Kayexalate 3 times a week. Earlier this month she was found to have a potassium of 6 and a creatinine of 2.03 this was probably related to Septra DS which she had ordered on 10/12. She was given at that point Kayexalate increased to 15 g daily. He also received a liter of IV fluid on 10/1. Today her follow-up lab work shows a sodium of 135 a potassium of 2.6 BUN of 22 and a creatinine of 1.04  Results for Morgan [name] (MRN [id number]) as of 06/16/2015 09:12  Ref. Range 05/21/2015 07:45 06/04/2015 07:10 06/06/2015 07:25 06/09/2015 06:00 06/16/2015 06:30  Sodium Latest Ref Range: 135-145 mmol/L 137 132 (L) 135 138 135  Potassium Latest Ref Range: 3.5-5.1 mmol/L 4.7 6.0 (H) 5.4 (H) 3.9 2.6 (LL)  Chloride Latest Ref Range: 101-111 mmol/L 107 106 108 112 (H) 107  CO2 Latest Ref Range: 22-32 mmol/L 25 19 (L) 21 (L) 22 25  BUN Latest Ref Range: 6-20 mg/dL 39 (H) 43 (H) 44 (H) 31 (H) 22 (H)  Creatinine Latest Ref Range: 0.44-1.00 mg/dL 1.39 (H) 2.03 (H) 2.00 (H) 1.27 (H) 1.04 (H)  Calcium Latest Ref Range: 8.9-10.3 mg/dL 8.8 (L) 9.5 9.3 9.1 8.5 (L)  EGFR (Non-African Amer.) Latest Ref Range: >60 mL/min 32 (L) 20 (L) 21 (L) 36 (L) 45 (L)  EGFR (African American) Latest Ref Range: >60 mL/min 37 (L) 23 (L) 24 (L) 41 (L) 52 (L)  Glucose Latest Ref Range: 65-99 mg/dL 88 97 89 88 88  Anion gap Latest Ref Range: 5-_0 (L) 3 (L)  Alkaline Phosphatase Latest Ref Range: 38-126 U/L 87      Albumin Latest Ref Range: 3.5-5.0 g/dL 3.6      AST Latest Ref Range: 15-41 U/L 20      ALT Latest Ref Range: 14-54 U/L 12 (L)      Total Protein Latest Ref Range: 6.5-8.1 g/dL 6.7      Total Bilirubin Latest Ref Range: 0.3-1.2 mg/dL 0.5      Troponin I Latest  Ref Range: <0.031 ng/mL <0.03      WBC Latest Ref Range: 4.0-10.5 K/uL 5.8  4.4    RBC Latest Ref Range: 3.87-5.11 MIL/uL 2.95 (L)  2.84 (L)    Hemoglobin Latest Ref Range: 12.0-15.0 g/dL 9.5 (L)  9.2 (L)    HCT Latest Ref Range: 36.0-46.0 % 29.0 (L)  28.2 (L)    MCV Latest Ref Range: 78.0-100.0 fL 98.3  99.3    MCH Latest Ref Range: 26.0-34.0 pg 32.2  32.4    MCHC Latest Ref Range: 30.0-36.0 g/dL 32.8  32.6    RDW Latest Ref Range: 11.5-15.5 % 13.0  13.1    Platelets Latest Ref Range: 150-400 K/uL 229  302    Neutrophils Latest Units: % 57  37    Lymphocytes Latest Units: % 27  53    Monocytes Relative Latest Units: % 11  5    Eosinophil Latest Units: % 4  5    Basophil Latest Units: % 1  0    NEUT# Latest Ref Range: 1.7-7.7 K/uL 3.3  1.6 (L)    Lymphocyte # Latest Ref Range: 0.7-4.0 K/uL 1.5  2.4  Monocyte # Latest Ref Range: 0.1-1.0 K/uL 0.6  0.2    Eosinophils Absolute Latest Ref Range: 0.0-0.7 K/uL 0.2  0.2    Basophils Absolute Latest Ref Range: 0.0-0.1 K/uL 0.1  0.0    WBC Morphology Unknown   ATYPICAL LYMPHOCYTES    Myelocytes Latest Units: %   0    Metamyelocytes Relative Latest Units: %   0    Promyelocytes Absolute Latest Units: %   0    Blasts Latest Units: %   0    nRBC Latest Ref Range: 0 /100 WBC   0    Band Neutrophils Latest Units: %   0    Other Latest Units: %   0    TSH Latest Ref Range: 0.350-4.500 uIU/mL 2.336        Past Medical History  Diagnosis Date  . Hypertension   . Dementia   . Anxiety   . Anemia   . GERD (gastroesophageal reflux disease)   . Hyperpotassemia   . Other severe protein-calorie malnutrition   . Hyperpotassemia   . Dysphagia, oropharyngeal phase     Past Surgical History  Procedure Laterality Date  . Appendectomy    . Abdominal hysterectomy     Medications Kayexalate 15 g daily Labetalol 200 twice a day Norvasc 5 daily Prilosec 20 daily   Review of Systems: Gen: Patient appears to be at her baseline needing and  drinking well Respiratory no shortness breath or cough no fever Cardiac no chest pain GI no abdominal pain GU history of UTIs but no dysuria today Musculoskeletal no joint pain neurologic no headaches or dizziness. Psychiatric she does have a history of dementia with agitation although she was calm and cooperative today.  Physical examination Gen. patient appears to be in no distress sitting up in a wheelchair. Vitals O2 sat 95% on room air pulse 76 respirations 18 Respiratory; clear entry bilaterally Cardiac heart sounds are normal she appears to be euvolemic Abdomen; bowel sounds are positive no liver no spleen no masses GU; bladder is not distended there is no CVA tenderness. Extremities no edema  Impression/plan #1 acute renal failure on chronic renal failure likely related to the use of Septra for UTI. 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 The Kayexalate will need to be put on hold and her potassium repleted. I think we probably will need to restart her Kayexalate on the original 3 times a week dosing at some point #4 chronic renal failure secondary to nephrosclerosis

## 2015-06-18 ENCOUNTER — Encounter (HOSPITAL_COMMUNITY)
Admission: AD | Admit: 2015-06-18 | Discharge: 2015-06-18 | Disposition: A | Discharge status: Home / Self Care | Payer: Medicare Other | Source: Skilled Nursing Facility | Attending: Internal Medicine | Admitting: Internal Medicine

## 2015-06-18 ENCOUNTER — Non-Acute Institutional Stay (SKILLED_NURSING_FACILITY): Payer: Medicare Other | Admitting: Internal Medicine

## 2015-06-18 DIAGNOSIS — E875 Hyperkalemia: Secondary | ICD-10-CM | POA: Insufficient documentation

## 2015-06-18 DIAGNOSIS — E876 Hypokalemia: Secondary | ICD-10-CM | POA: Diagnosis not present

## 2015-06-18 DIAGNOSIS — I10 Essential (primary) hypertension: Secondary | ICD-10-CM | POA: Diagnosis present

## 2015-06-18 DIAGNOSIS — N179 Acute kidney failure, unspecified: Secondary | ICD-10-CM | POA: Diagnosis not present

## 2015-06-18 LAB — BASIC METABOLIC PANEL
Anion gap: 5 (ref 5–15)
BUN: 22 mg/dL — AB (ref 6–20)
CO2: 23 mmol/L (ref 22–32)
Calcium: 9.1 mg/dL (ref 8.9–10.3)
Chloride: 111 mmol/L (ref 101–111)
Creatinine, Ser: 1.07 mg/dL — ABNORMAL HIGH (ref 0.44–1.00)
GFR calc Af Amer: 51 mL/min — ABNORMAL LOW (ref >60)
GFR, EST NON AFRICAN AMERICAN: 44 mL/min — AB (ref >60)
GLUCOSE: 92 mg/dL (ref 65–99)
POTASSIUM: 5.2 mmol/L — AB (ref 3.5–5.1)
Sodium: 139 mmol/L (ref 135–145)

## 2015-06-18 LAB — MAGNESIUM: Magnesium: 1.8 mg/dL (ref 1.7–2.4)

## 2015-06-18 NOTE — Progress Notes (Signed)
Patient ID: Morgan [name], female   DOB: [birth year], 81 y.o.   MRN: [id number]     Facility; [facility] Chief complaint; follow-up renal insufficiency with recent hypokalemia-previous hyperkalemia  HPI- this is an elderly woman with chronic renal insufficiency. She also has a history of hyperkalemia and until recently had been on Kayexalate 3 times a week. Earlier this month she was found to have a potassium of 6 and a creatinine of 2.03 this was probably related to Septra DS which she had ordered on 10/12. She was given at that point Kayexalate increased to 15 g daily.  also received a liter of IV fluid on 10/1. On October 31 her follow-up lab work showed a sodium of 135 a potassium of 2.6 BUN of 22 and a creatinine of 1.04 Dr. Dellia [name] did address this and gave her 4 doses of potassium 40 mEq a day and held her Kayexalate her potassium today has risen up to 5.2 her creatinine remained stable at 1.07 BUN of 22 sodium 139 her magnesium level was low normal at 1.8  Her vital signs are stable she continues to we will about the facility and appears to be at her baseline she has no complaints  Results for Morgan [name] (MRN [id number]) as of 06/16/2015 09:12  Ref. Range 05/21/2015 07:45 06/04/2015 07:10 06/06/2015 07:25 06/09/2015 06:00 06/16/2015 06:30  Sodium Latest Ref Range: 135-145 mmol/L 137 132 (L) 135 138 135  Potassium Latest Ref Range: 3.5-5.1 mmol/L 4.7 6.0 (H) 5.4 (H) 3.9 2.6 (LL)  Chloride Latest Ref Range: 101-111 mmol/L 107 106 108 112 (H) 107  CO2 Latest Ref Range: 22-32 mmol/L 25 19 (L) 21 (L) 22 25  BUN Latest Ref Range: 6-20 mg/dL 39 (H) 43 (H) 44 (H) 31 (H) 22 (H)  Creatinine Latest Ref Range: 0.44-1.00 mg/dL 1.39 (H) 2.03 (H) 2.00 (H) 1.27 (H) 1.04 (H)  Calcium Latest Ref Range: 8.9-10.3 mg/dL 8.8 (L) 9.5 9.3 9.1 8.5 (L)  EGFR (Non-African Amer.) Latest Ref Range: >60 mL/min 32 (L) 20 (L) 21 (L) 36 (L) 45 (L)  EGFR (African American) Latest Ref Range: >60 mL/min 37 (L) 23 (L) 24 (L) 41 (L)  52 (L)  Glucose Latest Ref Range: 65-99 mg/dL 88 97 89 88 88  Anion gap Latest Ref Range: 5-_0 (L) 3 (L)  Alkaline Phosphatase Latest Ref Range: 38-126 U/L 87      Albumin Latest Ref Range: 3.5-5.0 g/dL 3.6      AST Latest Ref Range: 15-41 U/L 20      ALT Latest Ref Range: 14-54 U/L 12 (L)      Total Protein Latest Ref Range: 6.5-8.1 g/dL 6.7      Total Bilirubin Latest Ref Range: 0.3-1.2 mg/dL 0.5      Troponin I Latest Ref Range: <0.031 ng/mL <0.03      WBC Latest Ref Range: 4.0-10.5 K/uL 5.8  4.4    RBC Latest Ref Range: 3.87-5.11 MIL/uL 2.95 (L)  2.84 (L)    Hemoglobin Latest Ref Range: 12.0-15.0 g/dL 9.5 (L)  9.2 (L)    HCT Latest Ref Range: 36.0-46.0 % 29.0 (L)  28.2 (L)    MCV Latest Ref Range: 78.0-100.0 fL 98.3  99.3    MCH Latest Ref Range: 26.0-34.0 pg 32.2  32.4    MCHC Latest Ref Range: 30.0-36.0 g/dL 32.8  32.6    RDW Latest Ref Range: 11.5-15.5 % 13.0  13.1    Platelets Latest Ref Range: 150-400 K/uL 229  302    Neutrophils Latest Units: % 57  37    Lymphocytes Latest Units: % 27  53    Monocytes Relative Latest Units: % 11  5    Eosinophil Latest Units: % 4  5    Basophil Latest Units: % 1  0    NEUT# Latest Ref Range: 1.7-7.7 K/uL 3.3  1.6 (L)    Lymphocyte # Latest Ref Range: 0.7-4.0 K/uL 1.5  2.4    Monocyte # Latest Ref Range: 0.1-1.0 K/uL 0.6  0.2    Eosinophils Absolute Latest Ref Range: 0.0-0.7 K/uL 0.2  0.2    Basophils Absolute Latest Ref Range: 0.0-0.1 K/uL 0.1  0.0    WBC Morphology Unknown   ATYPICAL LYMPHOCYTES    Myelocytes Latest Units: %   0    Metamyelocytes Relative Latest Units: %   0    Promyelocytes Absolute Latest Units: %   0    Blasts Latest Units: %   0    nRBC Latest Ref Range: 0 /100 WBC   0    Band Neutrophils Latest Units: %   0    Other Latest Units: %   0    TSH Latest Ref Range: 0.350-4.500 uIU/mL 2.336        Past Medical History  Diagnosis Date  . Hypertension   . Dementia   . Anxiety   . Anemia   . GERD  (gastroesophageal reflux disease)   . Hyperpotassemia   . Other severe protein-calorie malnutrition   . Hyperpotassemia   . Dysphagia, oropharyngeal phase     Past Surgical History  Procedure Laterality Date  . Appendectomy    . Abdominal hysterectomy     Medications Kayexalate 15 g daily--currently on hold Labetalol 200 twice a day Norvasc 5 daily Prilosec 20 daily   Review of Systems: Gen: Patient appears to be at her baseline eating and drinking well Respiratory no shortness breath or cough no fever Cardiac no chest pain GI no abdominal pain GU history of UTIs but no dysuria today Musculoskeletal no joint pain neurologic no headaches or dizziness. Psychiatric she does have a history of dementia with agitation although she was calm and cooperative today.  Physical examination Gen. patient appears to be in no distress sitting up in a wheelchair.  Respiratory; clear entry bilaterally Cardiac heart sounds are normal  RRR- she appears to be euvolemic Abdomen; bowel sounds are positive no liver no spleen no masses GU; bladder is not distended there is no CVA tenderness. 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

## 2015-06-23 ENCOUNTER — Encounter (HOSPITAL_COMMUNITY)
Admission: RE | Admit: 2015-06-23 | Discharge: 2015-06-23 | Disposition: A | Discharge status: Home / Self Care | Payer: Medicare Other | Source: Skilled Nursing Facility | Attending: Internal Medicine | Admitting: Internal Medicine

## 2015-06-23 DIAGNOSIS — I10 Essential (primary) hypertension: Secondary | ICD-10-CM | POA: Diagnosis not present

## 2015-06-23 LAB — BASIC METABOLIC PANEL
ANION GAP: 7 (ref 5–15)
BUN: 31 mg/dL — AB (ref 6–20)
CALCIUM: 9.1 mg/dL (ref 8.9–10.3)
CO2: 26 mmol/L (ref 22–32)
Chloride: 106 mmol/L (ref 101–111)
Creatinine, Ser: 1.34 mg/dL — ABNORMAL HIGH (ref 0.44–1.00)
GFR calc Af Amer: 39 mL/min — ABNORMAL LOW (ref >60)
GFR, EST NON AFRICAN AMERICAN: 33 mL/min — AB (ref >60)
Glucose, Bld: 88 mg/dL (ref 65–99)
POTASSIUM: 4.5 mmol/L (ref 3.5–5.1)
SODIUM: 139 mmol/L (ref 135–145)

## 2015-08-05 IMAGING — US US EXTREM  UP VENOUS*R*
1 series · 13 of 24 positions shown · non-contrast
Comparison: None.

CLINICAL DATA: Right upper extremity pain for 1 day



[Series 1: us extrem up venous*right* · 0.05mm/px · 13 of 36 slices shown]
[im 1/36]
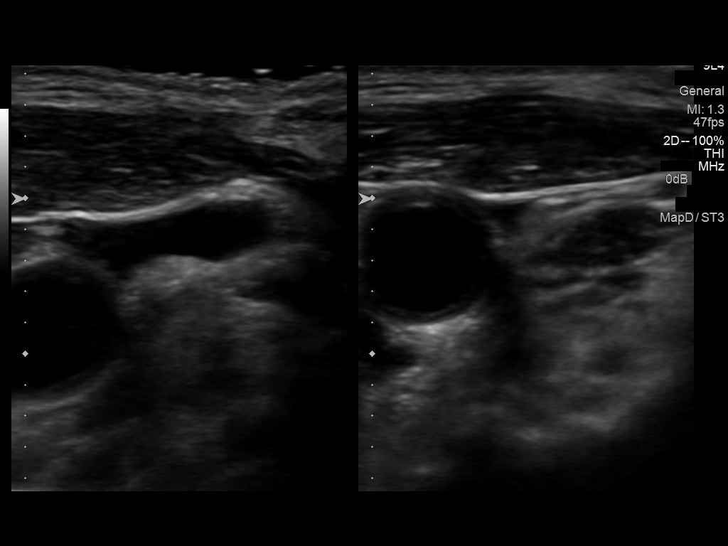
[im 4/36]
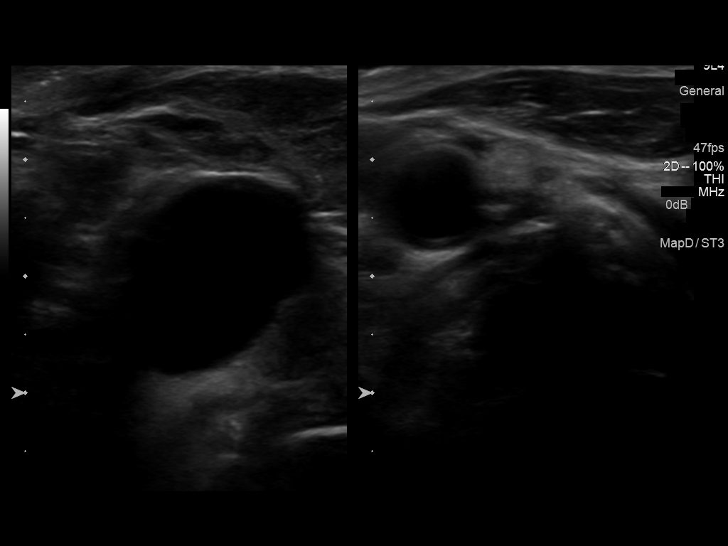
[im 7/36]
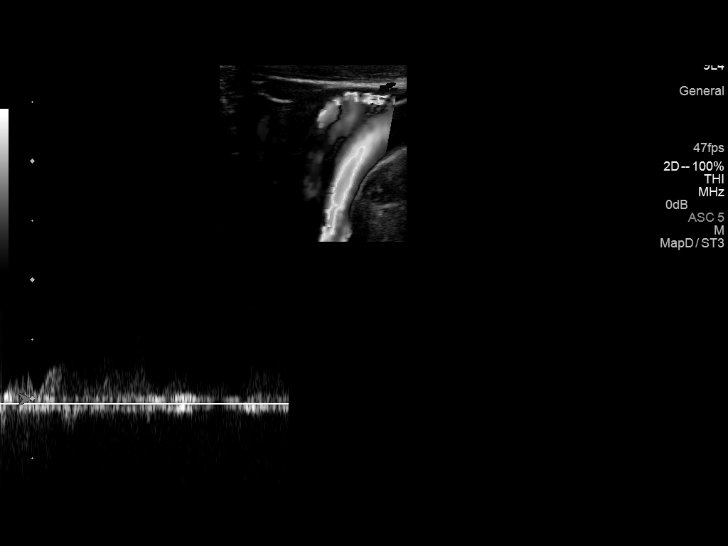
[im 10/36]
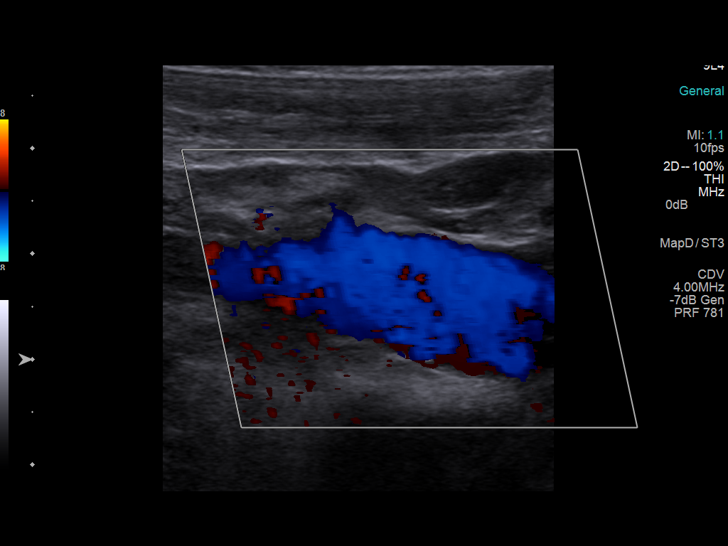
[im 13/36]
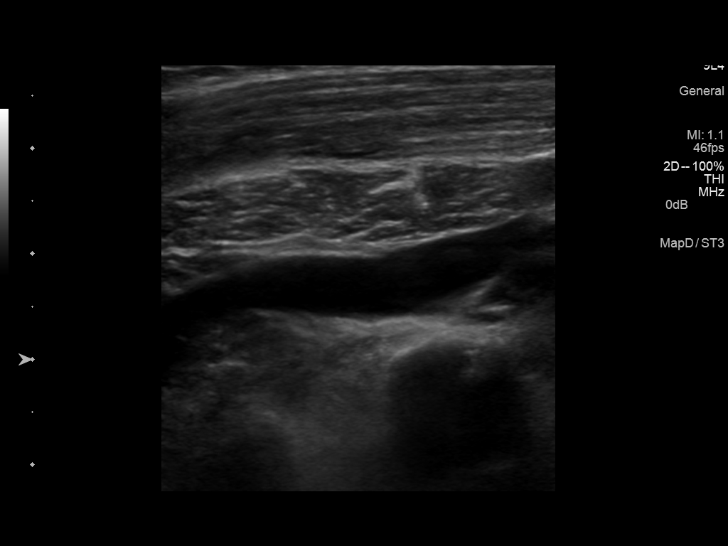
[im 16/36]
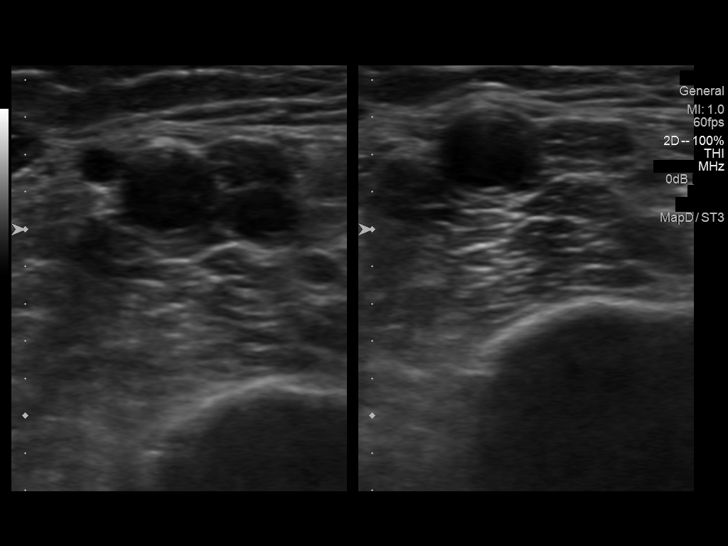
[im 19/36]
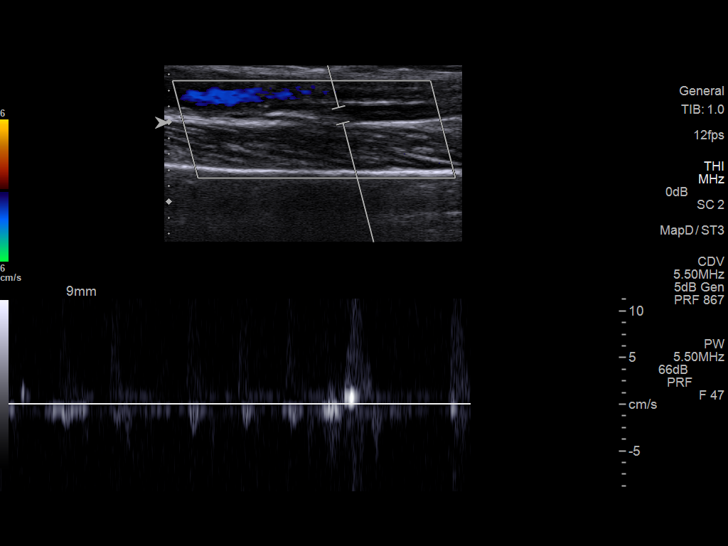
[im 20/36]
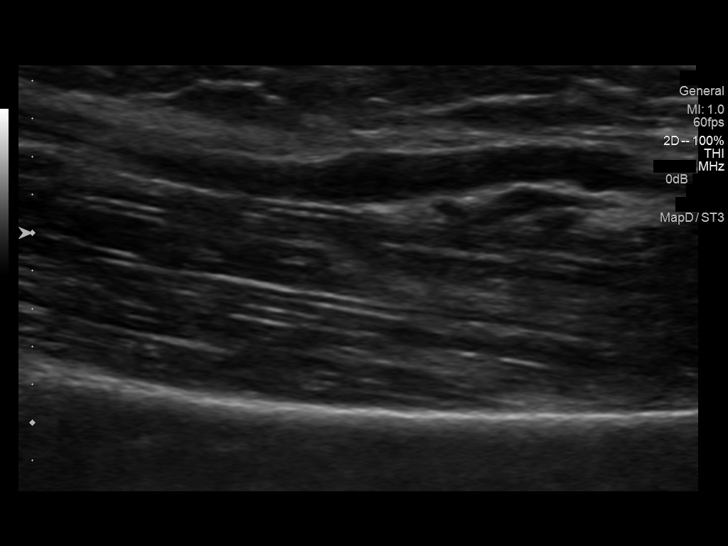
[im 23/36]
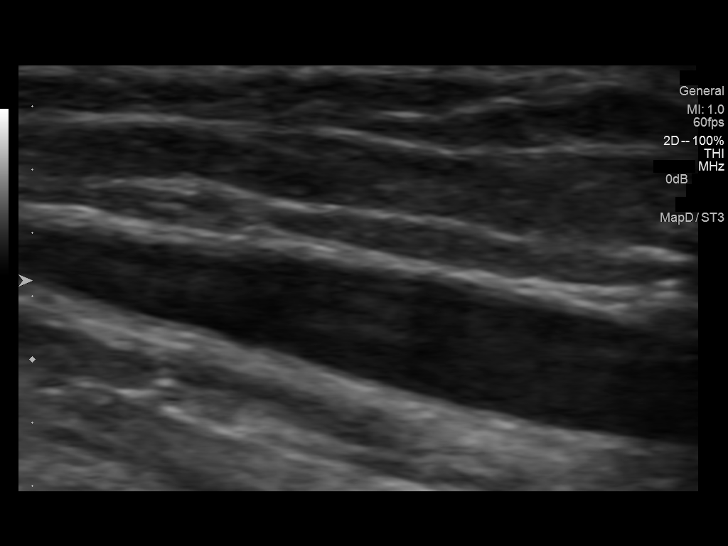
[im 26/36]
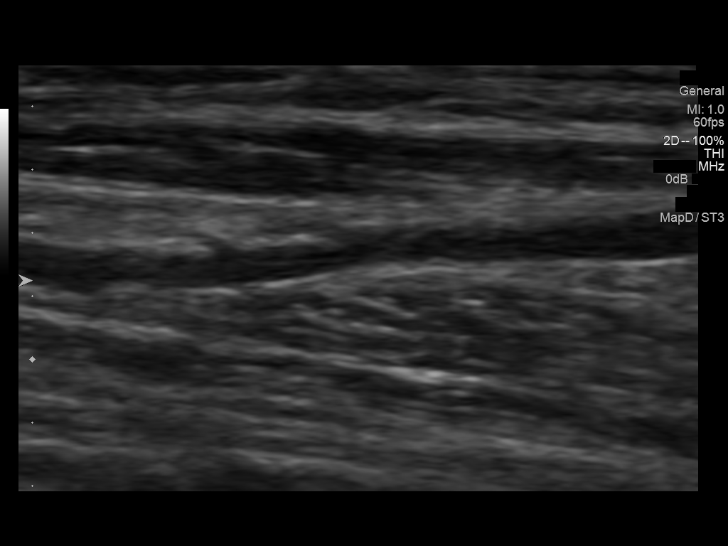
[im 29/36]
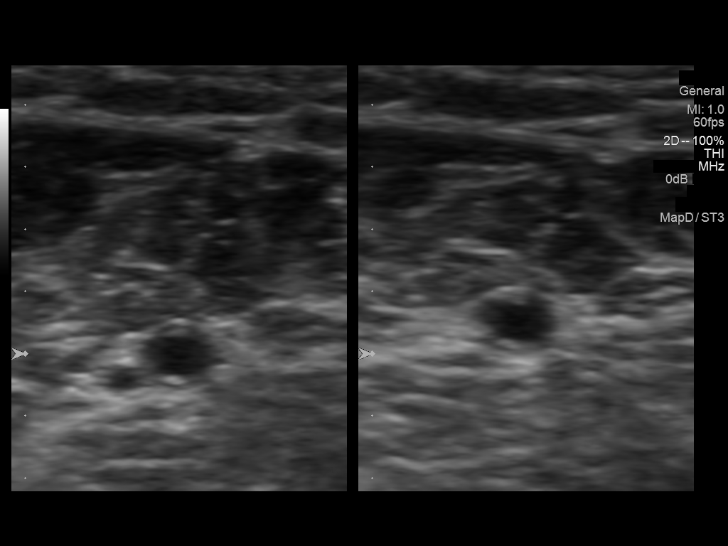
[im 32/36]
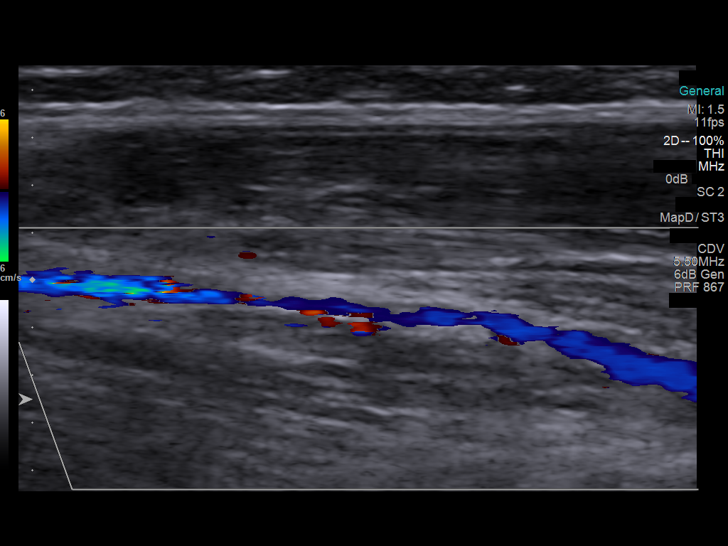
[im 36/36]
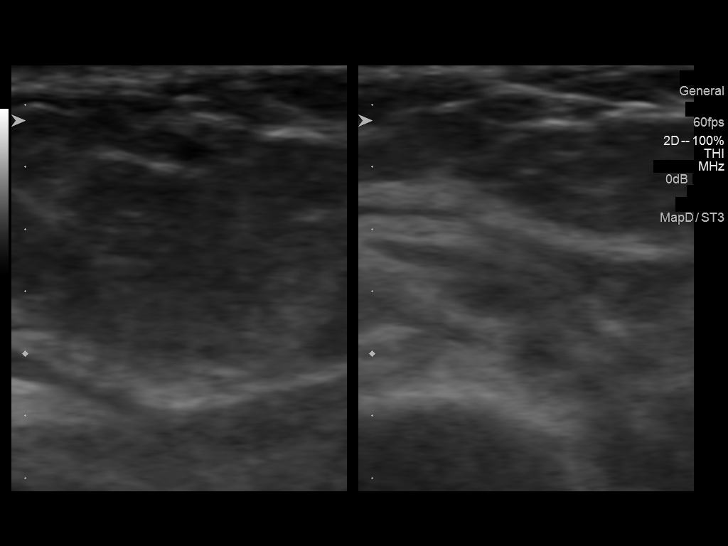

[13 of 24 positions shown; findings below may reference images not displayed]

FINDINGS: Contralateral Subclavian Vein: Respiratory phasicity is normal and
symmetric with the symptomatic side. No evidence of thrombus. Normal
compressibility.

Internal Jugular Vein: No evidence of thrombus. Normal
compressibility, respiratory phasicity and response to augmentation.

Subclavian Vein: No evidence of thrombus. Normal compressibility,
respiratory phasicity and response to augmentation.

Axillary Vein: No evidence of thrombus. Normal compressibility,
respiratory phasicity and response to augmentation.

Cephalic Vein: No evidence of thrombus. Normal compressibility,
respiratory phasicity and response to augmentation.

Basilic Vein: No evidence of thrombus. Normal compressibility,
respiratory phasicity and response to augmentation.

Brachial Veins: No evidence of thrombus. Normal compressibility,
respiratory phasicity and response to augmentation.

Radial Veins: No evidence of thrombus. Normal compressibility,
respiratory phasicity and response to augmentation.

Ulnar Veins: No evidence of thrombus. Normal compressibility,
respiratory phasicity and response to augmentation.

Venous Reflux:  None visualized.

Other Findings:  None visualized.
IMPRESSION: No evidence of deep venous thrombosis.

## 2015-08-14 IMAGING — CR DG KNEE COMPLETE 4+V*R*
4 series · 4 of 4 positions shown · non-contrast
Comparison: None

CLINICAL DATA: Acute RIGHT knee pain post fall, anterior pain
distal to patella, and nursing facility, dementia, unable to relate
additional history, initial encounter

EXAM:
RIGHT KNEE - COMPLETE 4+ VIEW

[view not recorded (1 of 4)]
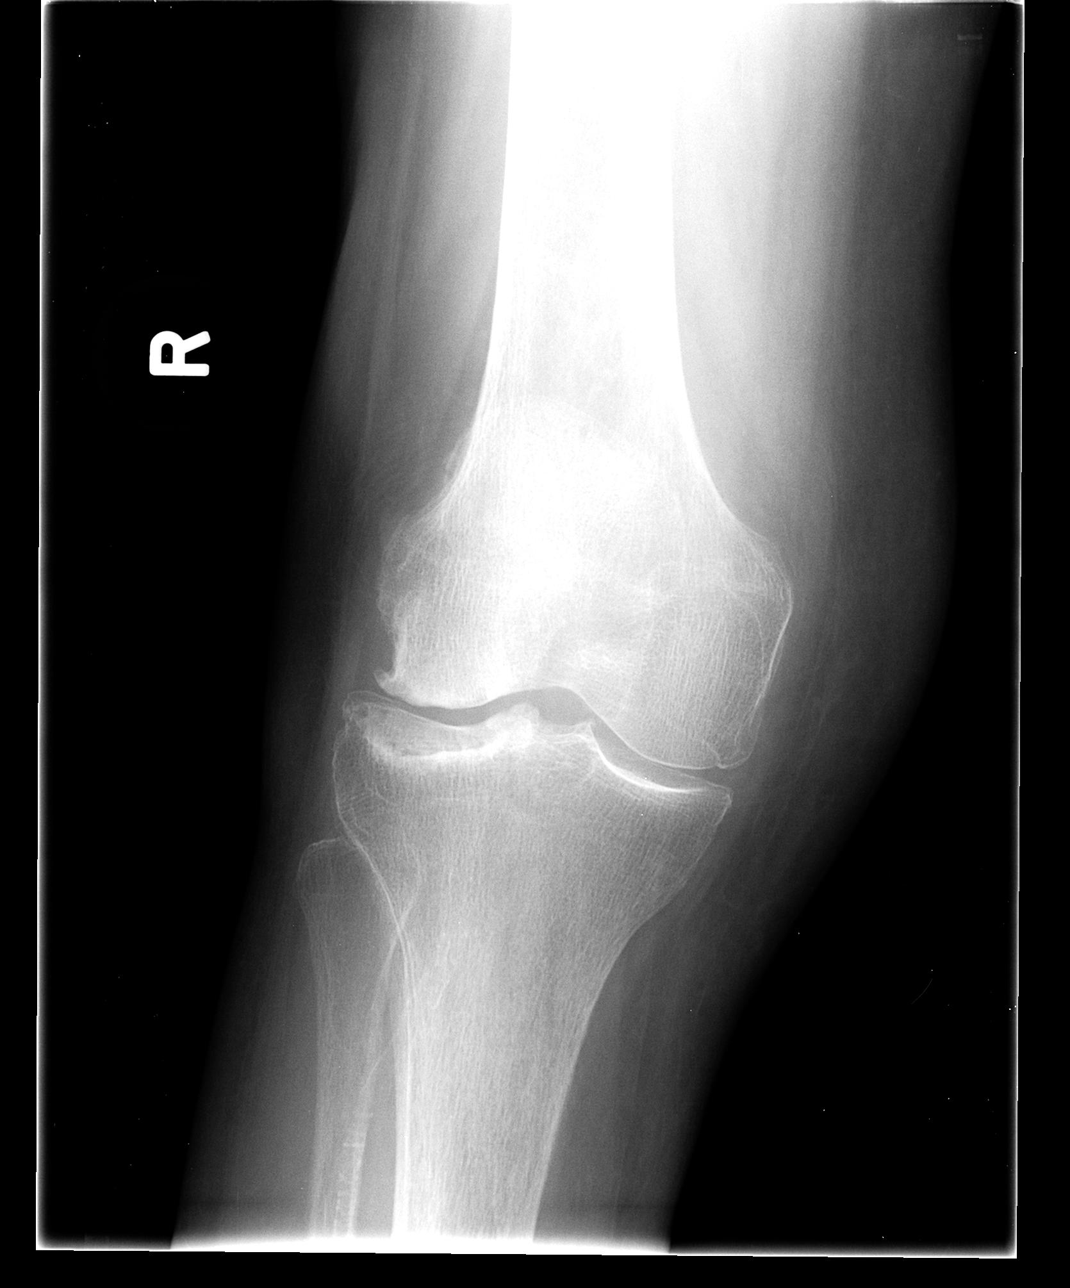

[view not recorded (2 of 4)]
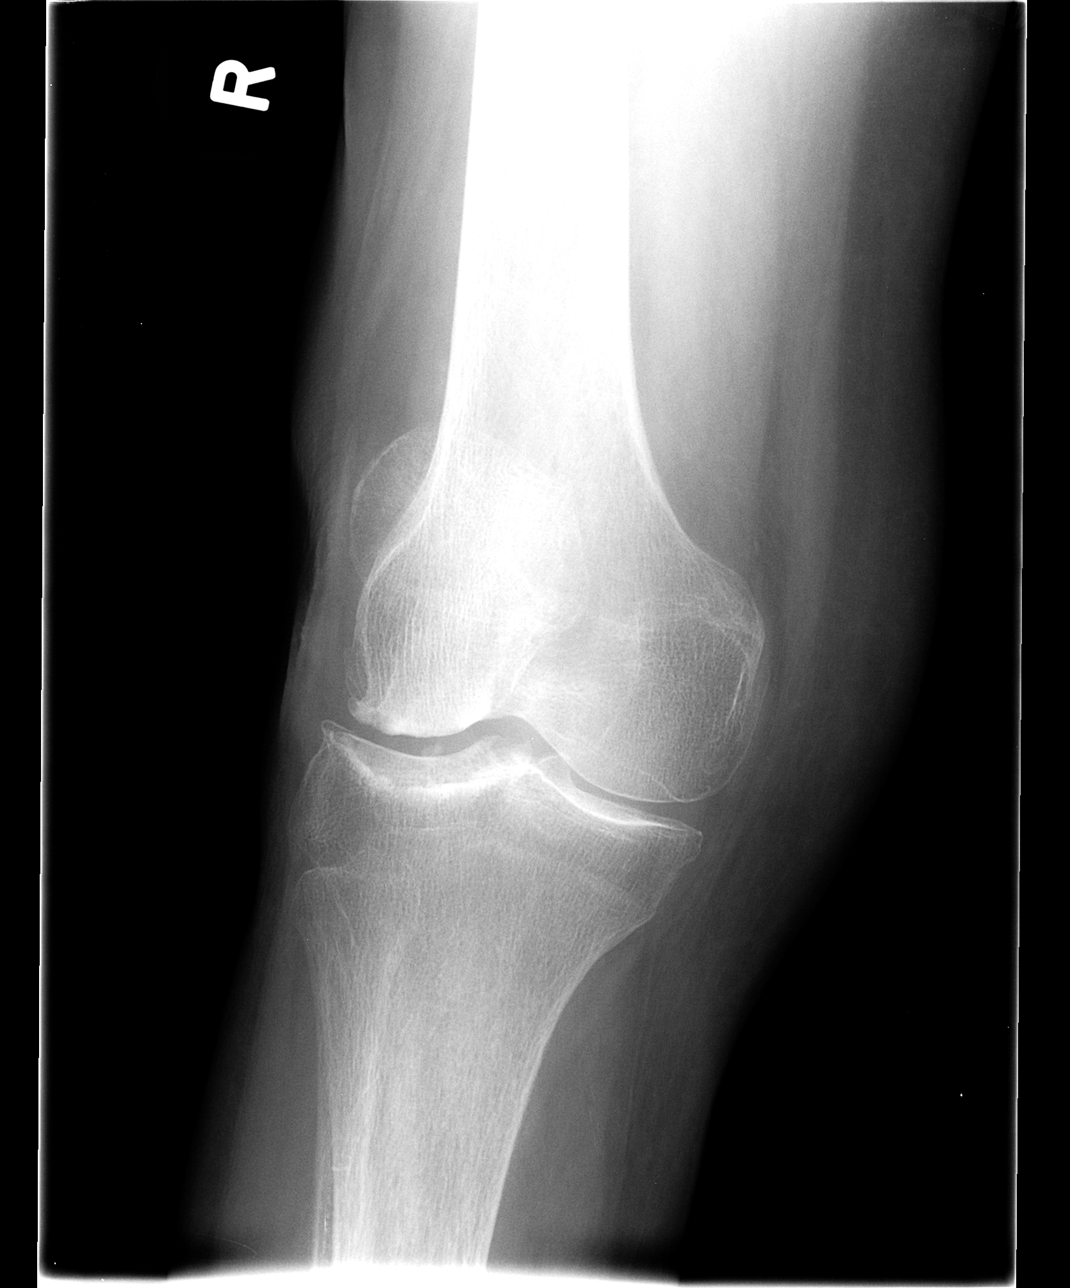

[view not recorded (3 of 4)]
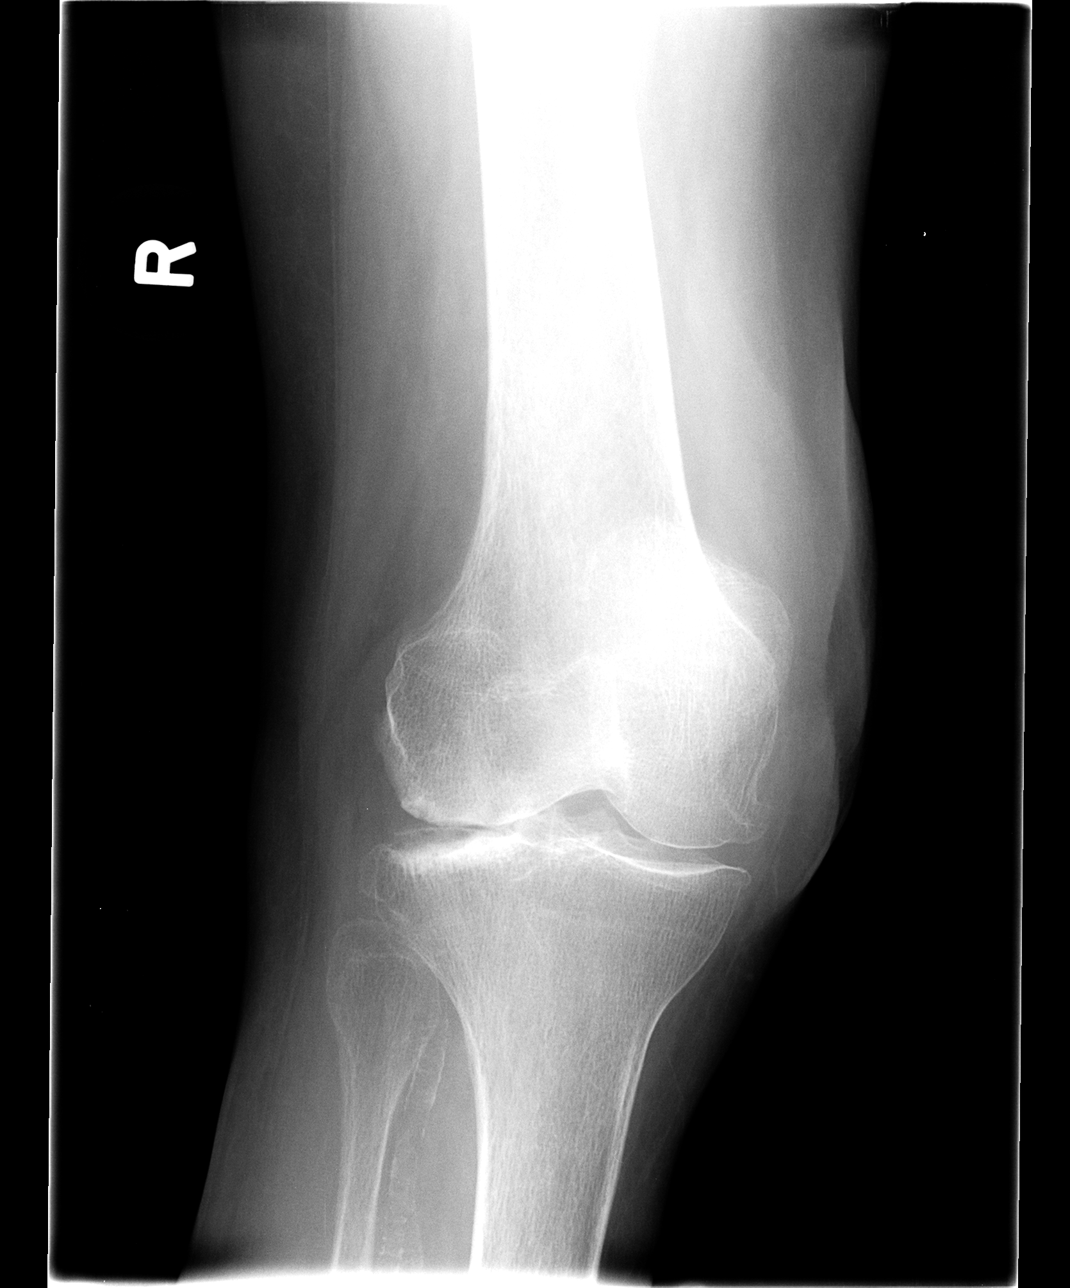

[view not recorded (4 of 4)]
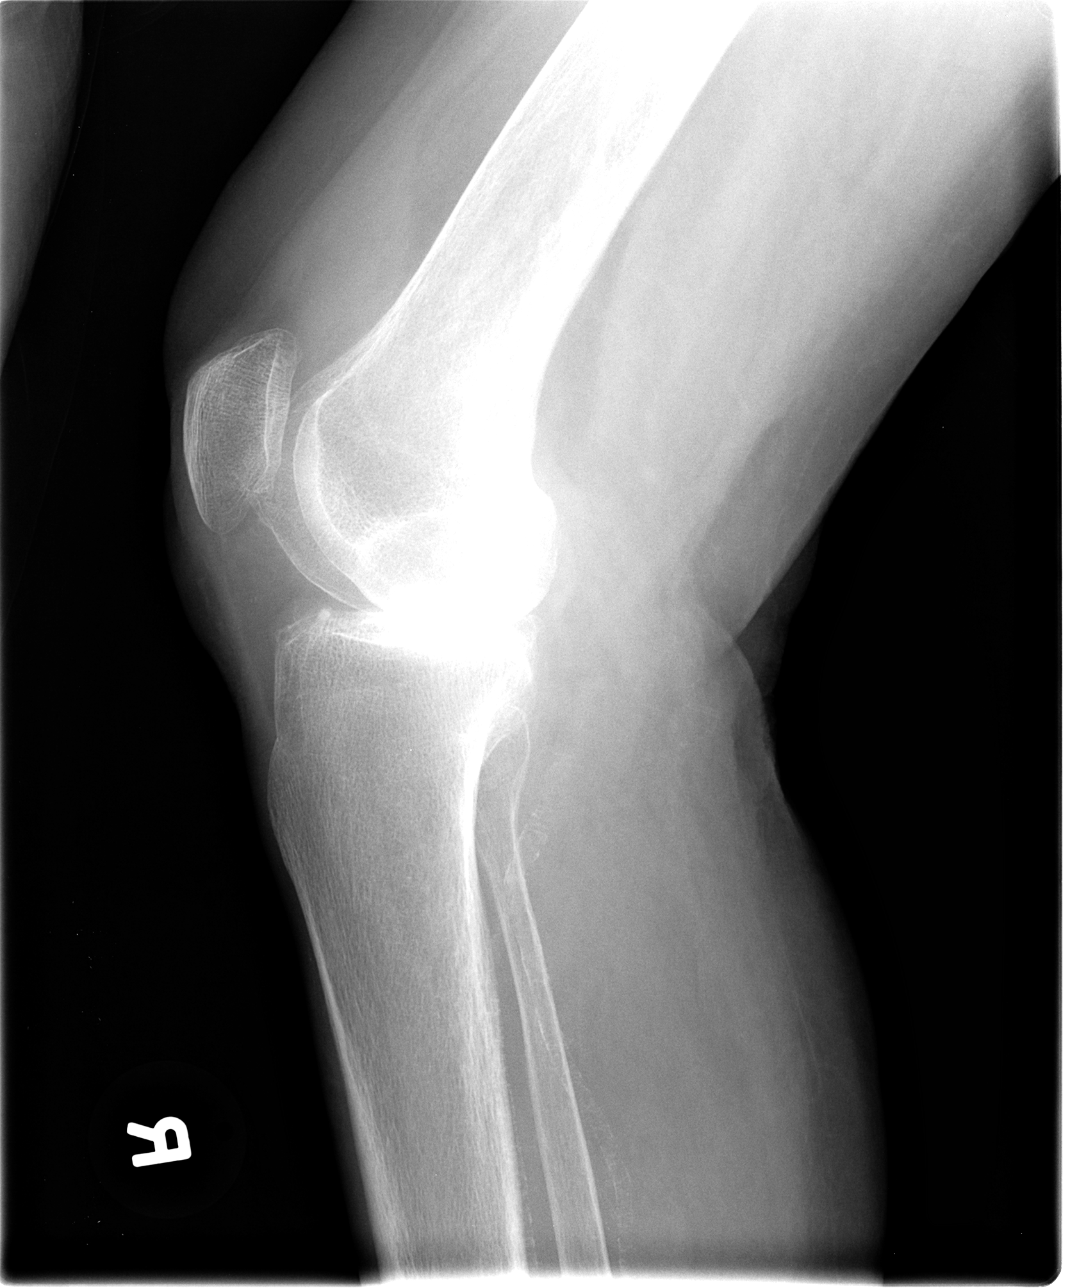

[4 of 4 positions shown; findings below may reference images not displayed]

FINDINGS: Diffuse osseous demineralization.

Knee joint effusion present.

Degenerative changes with joint space narrowing and spur formation.

Subchondral sclerosis present at the lateral femoral condyle and
lateral tibial plateau.

Mild concavity of the lateral tibial plateau is identified, question
mildly depressed fracture versus sequela of degenerative changes.

No additional fracture or dislocation seen.

Scattered atherosclerotic calcifications.
IMPRESSION: Degenerative changes of RIGHT knee greatest at lateral compartment
with concavity of the lateral tibial plateau associated knee joint
effusion; unable to exclude central compression deformity of the
lateral plateau.

Recommend CT RIGHT knee without contrast for further assessment.

## 2015-10-11 ENCOUNTER — Non-Acute Institutional Stay (SKILLED_NURSING_FACILITY): Payer: Medicare Other | Admitting: Internal Medicine

## 2015-10-11 ENCOUNTER — Other Ambulatory Visit (HOSPITAL_COMMUNITY)
Admission: AD | Admit: 2015-10-11 | Discharge: 2015-10-11 | Disposition: A | Discharge status: Home / Self Care | Payer: Medicare Other | Source: Skilled Nursing Facility | Attending: Internal Medicine | Admitting: Internal Medicine

## 2015-10-11 DIAGNOSIS — I10 Essential (primary) hypertension: Secondary | ICD-10-CM

## 2015-10-11 DIAGNOSIS — F03918 Unspecified dementia, unspecified severity, with other behavioral disturbance: Secondary | ICD-10-CM

## 2015-10-11 DIAGNOSIS — F0391 Unspecified dementia with behavioral disturbance: Secondary | ICD-10-CM | POA: Diagnosis not present

## 2015-10-11 DIAGNOSIS — N289 Disorder of kidney and ureter, unspecified: Secondary | ICD-10-CM | POA: Diagnosis not present

## 2015-10-11 DIAGNOSIS — E875 Hyperkalemia: Secondary | ICD-10-CM | POA: Diagnosis not present

## 2015-10-11 DIAGNOSIS — R05 Cough: Secondary | ICD-10-CM

## 2015-10-11 DIAGNOSIS — Z139 Encounter for screening, unspecified: Secondary | ICD-10-CM | POA: Insufficient documentation

## 2015-10-11 DIAGNOSIS — R059 Cough, unspecified: Secondary | ICD-10-CM

## 2015-10-11 LAB — INFLUENZA PANEL BY PCR (TYPE A & B)
H1N1 flu by pcr: NOT DETECTED
Influenza A By PCR: NEGATIVE
Influenza B By PCR: NEGATIVE

## 2015-10-11 NOTE — Progress Notes (Signed)
Patient ID: Morgan [name], female   DOB: May 08, 1921, 82 y.o.   MRN: [id number]        This  is a routine-acute  visit.  Level of care skilled.  Facility [facility].   Chief complaint-medical management of chronic medical issues including renal insufficiency-failure to thrive-anemia Hypertension-dementia    History of present illness .  Patient is a very pleasant 82 year old female who actually has  beenquite stable now for some time.  She really came in with failure to thrive issues but she has responded  well during her stay here.  She has occasionally had a UTI-also has renal insufficiency-she has had a high potassium in the past this appears to have stabilized on routine Kayexalate  Several months ago she did have an elevated potassium this was all secondary to the Septra she was receiving for UTI-Septra was discontinued-her Kayexalate dose was reduced to twice a week and this appears to have stabilized although this will warrant updating most recent potassium was 4.5 BUN 31 creatinine 1.34 -she is eating and drinking pretty well according to nursing staff--  She does have a history of normocytic anemia suspect chronic we will recheck this as well.--Last hemoglobin was 9.5  Dementia-- would say moderate she does ambulate about the facility is usually pleasant and interactive--at times she will have periods of agitation at one point had added Ativan when necessary order but this was discontinued secondary to non use-according in nursing staff there are still times she gets agitated but apparently does better when she is redirected from the source of agitation this appears to occur more in the early evening hours-at times she will insist she needs to go home  for a ride-    She does have a history of hypertension she is on Norvasc and labetalol --his blood pressures appear to be stable-120/64-I do see 155/78 but this does not appear to be common with the systolic elevation  Today patient does not  express any concerns she continues to have a leak about the facility usually is pleasant but apparently again had some agitation increased confusion more so at night she is pleasant and appropriate on exam this evening     .  Family medical social history has been reviewed per discharge summary on 03/19/2013.--And recent progress notes    Medications have been reviewed per [facility]  Include Tylenol 650 mg every 4 hours when necessary pain. .  Imodium when necessary.  Kayexalate 15 g 2 times a week  Labetalol 200 mg twice a day   Norvasc 5 mg daily Prilosec 20 mg daily .  Review of systems --limited secondary to dementia-obtain from patient and nursing in general no complaints of fever chills .  Skin does not complaining of rashes or itching.  Head ears eyes nose mouth and throat has prescription lenses does not complaining of visual changes-- Respiratory no shortness of breath --nursing has noted an occasional cough Cardiac no chest pain or significant lower extremity edema.  GI does not complaining of any abdominal discomfort nausea vomiting diarrhea or constipation.  GU history UTIs but did not overtly complain of dysuria today  Musculoskeletal does not complaining of joint pain.  Neurologic does not complaining of headaches -several months ago did complain of dizziness-she actually went to the ER at one time was thought to have a UTI there has not really been any recurrence here in some time-  Psych is a history of dementia but this appears to be relatively stable is doing  very well in this setting--with occasional periods of agitation  Physical exam Temperature 97.1 pulse 68 respirations 20 blood pressure 100/64 weight is 114  General this is a pleasant elderly female in no distress -comfortably in bed  Her skin is warm and dry.  Eyes she has prescription lenses pupils appear reactive to light sclera and conjunctiva are clear.visual acuity appears  intact     Throat--oropharynx clear mucous membranes moist.  She does have numerous extractions  Chest is clear to auscultation no labored breathing.  Heart is regular rate and rhythm without murmur gallop or rub.  She does not have significant lower extremity edema.  Abdomen soft nontender with positive bowel sounds.  Musculoskeletal does move all extremities at baseline.--Continues to have a small lump upper right arm this is nontender non-erythematous appears chronic  Neurologic appears grossly intact speech is clear strength is intact at baseline all 4 extremities her speech is clear tongue is midline.-Cranial nerves are intact  Psych she is oriented to self is pleasant and appropriate at her baseline--continues to be in good humor smiling    .  Labs  06/23/2015.  Sodium 139 potassium 4.5 BUN 31 creatinine 1.34.  06/18/2015-magnesium 1.8.  WBC 5.8 hemoglobin 9.5 platelets 229.  October 2016.  ALT 12-otherwise liver function tests within normal limits  04/14/2015.  Sodium 139 potassium 4.7 BUN 25 creatinine 1.18.  WBC 4.6 hemoglobin 9.3 platelets 234.    01/30/2015.  WBC 3.9 hemoglobin 9.5 platelets 250.  Sodium 136 potassium 5.1 BUN 34 creatinine 1.36-.  Liver function tests within normal limits albumin 3.6   09/30/2014.  Sodium 140 potassium 4.5 BUN 29 creatinine 1.13.  Albumin 3.3 otherwise liver function tests within normal limits.  WBC 5.0 hemoglobin 8.9 platelets 216  07/16/2015.  WBC 3.9 hemoglobin 9.5 platelets 219.  Sodium 140 potassium 4.8 BUN 23 creatinine 1.29  . 03/24/2014.  WBC 3.7-hemoglobin 9.0-platelets 217.  Sodium 140 potassium 4.5 BUN 25 creatinine 1.32.  Liver function tests within normal limits except albumin of 3.3.    01/21/2014.  WBC 3.4 hemoglobin 7.9 platelets 208.  01/18/2014.  Sodium 140 potassium 5 BUN 33 creatinine 1.41.  .  Frequent blood testing in late May and early June was negative.   01/07/2014.  Iron 59-total iron binding capacity 2:30-vitamin B12 625-folate greater than 20-ferritin elevated at 974.  Reticulocyte 1.5.   Assessment and plan.   Cough-chest exam was quite benign-at this point will order Mucinex 600 mg twice a day for 5 days and monitor-of note there is flu in the facility we will check a flu swab as well-she has been started on Tamiflu empirically Currently she is afebrile Will monitor vital signs with pulse ox every shift for 48 hours     #2-dementia with failure to thrive-this appears to be quite stable she is functioning well in this setting continue supportive care apparently she is eating and drinking fairly well --weights stable.  She is on supplements-   .  #3-renal insufficiency this appears to be somewhat variable  -again some of this was thought to Septra use for UTI previously when her potassium was significantly elevated-this has since normalized will update lab next laboratory day   #4-anemia of chronic disease-will update a CBC--her hemoglobin has been as low as 7.9- appears to have stabilized recently with hemoglobin of 9.5 will update this as well  #5-hypertension-at this point appear relatively stable on labetalol as well as Norvasc-s Systolic spikes do not appear to be the norm  .  6-history of hyperkalemia ia suspect due to renal insufficiency she is on Kayexalate 2imes a week --again will update potassium level   [id number]

## 2015-10-12 ENCOUNTER — Encounter (HOSPITAL_COMMUNITY)
Admission: RE | Admit: 2015-10-12 | Discharge: 2015-10-12 | Disposition: A | Discharge status: Home / Self Care | Payer: Medicare Other | Source: Skilled Nursing Facility | Attending: Internal Medicine | Admitting: Internal Medicine

## 2015-10-12 DIAGNOSIS — D649 Anemia, unspecified: Secondary | ICD-10-CM | POA: Insufficient documentation

## 2015-10-12 LAB — CBC WITH DIFFERENTIAL/PLATELET
BASOS PCT: 0 %
Basophils Absolute: 0 10*3/uL (ref 0.0–0.1)
EOS ABS: 0.3 10*3/uL (ref 0.0–0.7)
EOS PCT: 6 %
HCT: 28.7 % — ABNORMAL LOW (ref 36.0–46.0)
HEMOGLOBIN: 9.5 g/dL — AB (ref 12.0–15.0)
Lymphocytes Relative: 42 %
Lymphs Abs: 2 10*3/uL (ref 0.7–4.0)
MCH: 32.5 pg (ref 26.0–34.0)
MCHC: 33.1 g/dL (ref 30.0–36.0)
MCV: 98.3 fL (ref 78.0–100.0)
Monocytes Absolute: 0.6 10*3/uL (ref 0.1–1.0)
Monocytes Relative: 14 %
NEUTROS ABS: 1.7 10*3/uL (ref 1.7–7.7)
NEUTROS PCT: 38 %
PLATELETS: 227 10*3/uL (ref 150–400)
RBC: 2.92 MIL/uL — ABNORMAL LOW (ref 3.87–5.11)
RDW: 13 % (ref 11.5–15.5)
WBC: 4.6 10*3/uL (ref 4.0–10.5)

## 2015-10-12 LAB — BASIC METABOLIC PANEL
Anion gap: 7 (ref 5–15)
BUN: 36 mg/dL — AB (ref 6–20)
CHLORIDE: 104 mmol/L (ref 101–111)
CO2: 25 mmol/L (ref 22–32)
CREATININE: 1.39 mg/dL — AB (ref 0.44–1.00)
Calcium: 8.9 mg/dL (ref 8.9–10.3)
GFR, EST AFRICAN AMERICAN: 37 mL/min — AB (ref >60)
GFR, EST NON AFRICAN AMERICAN: 32 mL/min — AB (ref >60)
Glucose, Bld: 79 mg/dL (ref 65–99)
Potassium: 4.8 mmol/L (ref 3.5–5.1)
SODIUM: 136 mmol/L (ref 135–145)

## 2015-11-11 ENCOUNTER — Encounter: Payer: Self-pay | Admitting: Internal Medicine

## 2015-11-11 ENCOUNTER — Encounter (SKILLED_NURSING_FACILITY): Payer: Medicare Other | Admitting: Internal Medicine

## 2015-11-11 DIAGNOSIS — F0391 Unspecified dementia with behavioral disturbance: Secondary | ICD-10-CM | POA: Diagnosis not present

## 2015-11-11 DIAGNOSIS — I10 Essential (primary) hypertension: Secondary | ICD-10-CM | POA: Diagnosis not present

## 2015-11-11 DIAGNOSIS — E875 Hyperkalemia: Secondary | ICD-10-CM | POA: Diagnosis not present

## 2015-11-11 DIAGNOSIS — N289 Disorder of kidney and ureter, unspecified: Secondary | ICD-10-CM

## 2015-11-11 DIAGNOSIS — K047 Periapical abscess without sinus: Secondary | ICD-10-CM

## 2015-11-11 DIAGNOSIS — F03918 Unspecified dementia, unspecified severity, with other behavioral disturbance: Secondary | ICD-10-CM

## 2015-11-11 NOTE — Progress Notes (Deleted)
Patient ID: Morgan [name], female   DOB: 05/26/1921, 82 y.o.   MRN: [id number]  Location:  [facility] Service:  SNF (31) Provider:  ***  [name], MD  Patient Care Team: [name], MD as PCP - General (Unknown Physician Specialty)  Extended Emergency Contact Information Primary Emergency Contact: [name] Address: [address]          [place], [place] [address] [place] Phone: [phone] Relation: Other Secondary Emergency Contact: Morgan [name], [place] [address] [name] of [place] Phone: [phone] Mobile Phone: [phone] Relation: Niece  Code Status:  *** Goals of care: Advanced Directive information Advanced Directives 11/11/2015  Does patient have an advance directive? Yes  Type of Advance Directive Living will  Does patient want to make changes to advanced directive? No - Patient declined  Copy of advanced directive(s) in chart? Yes  Pre-existing out of facility DNR order (yellow form or pink MOST form) -     Chief Complaint  Patient presents with  . Acute Visit    HPI:  Pt is a 82 y.o. female seen today for an acute visit for    Past Medical History  Diagnosis Date  . Hypertension   . Dementia   . Anxiety   . Anemia   . GERD (gastroesophageal reflux disease)   . Hyperpotassemia   . Other severe protein-calorie malnutrition   . Hyperpotassemia   . Dysphagia, oropharyngeal phase    Past Surgical History  Procedure Laterality Date  . Appendectomy    . Abdominal hysterectomy      No Known Allergies  Current Outpatient Prescriptions on File Prior to Visit  Medication Sig Dispense Refill  . acetaminophen (TYLENOL) 325 MG tablet Take 650 mg by mouth every 4 (four) hours as needed (For pain or temp).    Marland Kitchen amLODipine (NORVASC) 5 MG tablet Take 5 mg by mouth daily. For HTN    . labetalol (NORMODYNE) 200 MG tablet Take 200 mg by mouth 2 (two) times daily. For HTN    .  loperamide (IMODIUM A-D) 2 MG tablet Take 2 mg by mouth 4 (four) times daily as needed for diarrhea or loose stools. May give 4mg  initially then 2 mg there after.    Marland Kitchen omeprazole (PRILOSEC) 20 MG capsule Take 20 mg by mouth daily. For GERD    . sodium polystyrene (KAYEXALATE) 15 GM/60ML suspension Take 15 g by mouth 3 (three) times a week. 2 times weekly on  Thursday and Sunday     No current facility-administered medications on file prior to visit.     Review of Systems  Immunization History  Administered Date(s) Administered  . Influenza-Unspecified 05/23/2014   Pertinent  Health Maintenance Due  Topic Date Due  . DEXA SCAN  04/30/1988  . PNA vac Low Risk Adult (1 of 2 - PCV13) 04/30/1988  . INFLUENZA VACCINE  03/17/2015   No flowsheet data found. Functional Status Survey:    Filed Vitals:   11/11/15 1327  BP: 121/68  Pulse: 80  Temp: 98 F (36.7 C)  TempSrc: Oral  Resp: 20  Weight: 113 lb 3.2 oz (51.347 kg)   Body mass index is 21.4 kg/(m^2). Physical Exam  Labs reviewed:  Recent Labs  06/18/15 0730 06/23/15 0615 10/12/15 0455  NA 139 139 136  K 5.2* 4.5 4.8  CL 111 106 104  CO2 23 26 25  GLUCOSE 92 88 79  BUN 22* 31* 36*  CREATININE 1.07* 1.34* 1.39*  CALCIUM 9.1 9.1 8.9  MG 1.8  --   --     Recent Labs  01/30/15 0815 05/21/15 0745  AST 20 20  ALT 13* 12*  ALKPHOS 94 87  BILITOT 0.5 0.5  PROT 6.7 6.7  ALBUMIN 3.6 3.6    Recent Labs  05/21/15 0745 06/06/15 0725 10/12/15 0455  WBC 5.8 4.4 4.6  NEUTROABS 3.3 1.6* 1.7  HGB 9.5* 9.2* 9.5*  HCT 29.0* 28.2* 28.7*  MCV 98.3 99.3 98.3  PLT 229 302 227   Lab Results  Component Value Date   TSH 2.336 05/21/2015   No results found for: HGBA1C No results found for: CHOL, HDL, LDLCALC, LDLDIRECT, TRIG, CHOLHDL  Significant Diagnostic Results in last 30 days:  No results found.  Assessment/Plan There are no diagnoses linked to this encounter.   Family/ staff Communication:  ***  Labs/tests ordered:  ***

## 2015-11-12 ENCOUNTER — Other Ambulatory Visit (HOSPITAL_COMMUNITY)
Admission: RE | Admit: 2015-11-12 | Discharge: 2015-11-12 | Disposition: A | Discharge status: Home / Self Care | Payer: Medicare Other | Source: Skilled Nursing Facility | Attending: Internal Medicine | Admitting: Internal Medicine

## 2015-11-12 DIAGNOSIS — I10 Essential (primary) hypertension: Secondary | ICD-10-CM | POA: Insufficient documentation

## 2015-11-12 DIAGNOSIS — K047 Periapical abscess without sinus: Secondary | ICD-10-CM | POA: Insufficient documentation

## 2015-11-12 LAB — BASIC METABOLIC PANEL
ANION GAP: 7 (ref 5–15)
BUN: 28 mg/dL — ABNORMAL HIGH (ref 6–20)
CALCIUM: 9 mg/dL (ref 8.9–10.3)
CO2: 23 mmol/L (ref 22–32)
Chloride: 108 mmol/L (ref 101–111)
Creatinine, Ser: 1.18 mg/dL — ABNORMAL HIGH (ref 0.44–1.00)
GFR, EST AFRICAN AMERICAN: 45 mL/min — AB (ref >60)
GFR, EST NON AFRICAN AMERICAN: 39 mL/min — AB (ref >60)
Glucose, Bld: 94 mg/dL (ref 65–99)
Potassium: 4.4 mmol/L (ref 3.5–5.1)
Sodium: 138 mmol/L (ref 135–145)

## 2015-11-12 LAB — CBC WITH DIFFERENTIAL/PLATELET
BASOS ABS: 0 10*3/uL (ref 0.0–0.1)
BASOS PCT: 0 %
Eosinophils Absolute: 0.1 10*3/uL (ref 0.0–0.7)
Eosinophils Relative: 2 %
HEMATOCRIT: 27.4 % — AB (ref 36.0–46.0)
HEMOGLOBIN: 9.2 g/dL — AB (ref 12.0–15.0)
Lymphocytes Relative: 19 %
Lymphs Abs: 1.5 10*3/uL (ref 0.7–4.0)
MCH: 32.6 pg (ref 26.0–34.0)
MCHC: 33.6 g/dL (ref 30.0–36.0)
MCV: 97.2 fL (ref 78.0–100.0)
Monocytes Absolute: 1.1 10*3/uL — ABNORMAL HIGH (ref 0.1–1.0)
Monocytes Relative: 13 %
NEUTROS ABS: 5.3 10*3/uL (ref 1.7–7.7)
NEUTROS PCT: 66 %
Platelets: 277 10*3/uL (ref 150–400)
RBC: 2.82 MIL/uL — ABNORMAL LOW (ref 3.87–5.11)
RDW: 13 % (ref 11.5–15.5)
WBC: 8 10*3/uL (ref 4.0–10.5)

## 2015-11-12 NOTE — Progress Notes (Signed)
Patient ID: Morgan [name], female   DOB: Apr 26, 1921, 82 y.o.   MRN: [id number]         This  is a routine-acute  visit.  Level of care skilled.  Facility [facility].   Chief complaint-medical management of chronic medical issues including renal insufficiency-failure to thrive-anemia Hypertension-dementia Acute visit secondary to right-sided mouth pain suspect abscess   History of present illness .  Patient is a very pleasant 82 year old female who actually has  beenquite stable now for some time.  She really came in with failure to thrive issues but she has responded  well during her stay here.  She has occasionally had a UTI-also has renal insufficiency-she has had a high potassium in the past this appears to have stabilized on routine Kayexalate  Several months ago she did have an elevated potassium this was all secondary to the Septra she was receiving for UTI-Septra was discontinued-her Kayexalate dose was reduced to twice a week and this appears to have stabilized although this will warrant updating most recent potassium was 4.8 with a creatinine 1.39 -she is eating and drinking pretty well according to nursing staff--  She does have a history of normocytic anemia suspect chronic we will recheck this as well.--Last hemoglobin was 9.5  Dementia-- would say moderate she does ambulate about the facility is usually pleasant and interactive--at times she will have periods of agitation at one point had added Ativan when necessary order but this was discontinued secondary to non use-according in nursing staff there are still times she gets agitated but apparently does better when she is redirected from the source of agitation this appears to occur more in the early evening hours-    She does have a history of hypertension she is on Norvasc and labetalol --his blood pressures appear to be stable-most recently 121/68 which appears relatively baseline  Most acute issue today is some right sided mouth  discomfort there is some suspicion she may have a dental abscess she does have decay issues here-she is afebrile     .  Family medical social history has been reviewed per discharge summary on 03/19/2013.--And recent progress notes    Medications have been reviewed per [facility]  Include Tylenol 650 mg every 4 hours when necessary pain. .  Imodium when necessary.  Kayexalate 15 g 2 times a week  Labetalol 200 mg twice a day   Norvasc 5 mg daily Prilosec 20 mg daily .  Review of systems --limited secondary to dementia-obtain from patient and nursing in general no complaints of fever chills .  Skin does not complaining of rashes or itching.  Head ears eyes nose mouth and throat has prescription lenses does not complaining of visual changes Right-sided mouth pain as noted above-- Respiratory no shortness of breath --nursing has noted an occasional cough Cardiac no chest pain or significant lower extremity edema.  GI does not complaining of any abdominal discomfort nausea vomiting diarrhea or constipation.  GU history UTIs but did not overtly complain of dysuria today  Musculoskeletal does not complaining of joint pain.  Neurologic does not complaining of headaches -several months ago did complain of dizziness-she actually went to the ER at one time was thought to have a UTI there has not really been any recurrence here in some time-  Psych is a history of dementia but this appears to be relatively stable is doing very well in this setting--with occasional periods of agitation  Physical exam Temperature 98.0 pulse 80 respirations 20 blood  pressure 121/68  General this is a pleasant elderly female in no distress currently sitting in her wheelchair  Her skin is warm and dry.  Eyes she has prescription lenses pupils appear reactive to light sclera and conjunctiva are clear.visual acuity appears intact     Throat--oropharynx clear mucous membranes moist. She does have significant  dental decaythere is some tenderness to palpation of the lower right side of her face-I could not appreciate significant adenopathy-there is some tenderness to palpation of her lower right gum line  She does have numerous extractions  Chest is clear to auscultation no labored breathing.  Heart is regular rate and rhythm without murmur gallop or rub.  She does not have significant lower extremity edema.  Abdomen soft nontender with positive bowel sounds.  Musculoskeletal does move all extremities at baseline  Neurologic appears grossly intact speech is clear strength is intact at baseline all 4 extremities her speech is clear tongue is midline.-Cranial nerves are intact  Psych she is oriented to self is pleasant and appropriate at her baseline--continues to be in good humor smiling    .  Labs  10/12/2015.  WBC is 4.6 hemoglobin 9.5 platelets 227.  Sodium 136 potassium 4.8 BUN 36 creatinine 1.39  06/23/2015.  Sodium 139 potassium 4.5 BUN 31 creatinine 1.34.  06/18/2015-magnesium 1.8.  WBC 5.8 hemoglobin 9.5 platelets 229.  October 2016.  ALT 12-otherwise liver function tests within normal limits  04/14/2015.  Sodium 139 potassium 4.7 BUN 25 creatinine 1.18.  WBC 4.6 hemoglobin 9.3 platelets 234.    01/30/2015.  WBC 3.9 hemoglobin 9.5 platelets 250.  Sodium 136 potassium 5.1 BUN 34 creatinine 1.36-.  Liver function tests within normal limits albumin 3.6   09/30/2014.  Sodium 140 potassium 4.5 BUN 29 creatinine 1.13.  Albumin 3.3 otherwise liver function tests within normal limits.  WBC 5.0 hemoglobin 8.9 platelets 216  07/16/2015.  WBC 3.9 hemoglobin 9.5 platelets 219.  Sodium 140 potassium 4.8 BUN 23 creatinine 1.29  . 03/24/2014.  WBC 3.7-hemoglobin 9.0-platelets 217.  Sodium 140 potassium 4.5 BUN 25 creatinine 1.32.  Liver function tests within normal limits except albumin of 3.3.    01/21/2014.  WBC 3.4 hemoglobin 7.9 platelets 208.    01/18/2014.  Sodium 140 potassium 5 BUN 33 creatinine 1.41.  .  Frequent blood testing in late May and early June was negative.  01/07/2014.  Iron 59-total iron binding capacity 2:30-vitamin B12 625-folate greater than 20-ferritin elevated at 974.  Reticulocyte 1.5.   Assessment and plan.    #1 right-sided mouth pain suspect developing dental abscess-will start amoxicillin 500 mg twice a day empirically and obtain a dental consult as soon as possible certainly do not see any signs of acute distress or sepsis here--but will have to be watched     #2-dementia with failure to thrive-this appears to be quite stable she is functioning well in this setting continue supportive care apparently she is eating and drinking fairly well --weights stable.  She is on supplements-   .  #3-renal insufficiency this appears to be somewhat variable  -again some of this was thought to Septra use for UTI previously when her potassium was significantly elevated-this has since normalized will update lab next laboratory day   #4-anemia of chronic disease-will update a CBC--her hemoglobin has been as low as 7.9- appears to have stabilized recently with hemoglobin of 9.5 will update this as well  #5-hypertension-at this point appear relatively stable on labetalol as well as Norvasc-s Systolic spikes do  not appear to be the norm  .6-history of hyperkalemia ia suspect due to renal insufficiency she is on Kayexalate 2imes a week --again will update potassium level   [phone]

## 2015-11-12 NOTE — Progress Notes (Deleted)
Patient ID: Morgan [name], female   DOB: August 04, 1921, 82 y.o.   MRN: [id number]        Date is 11/11/2015 This  is a routine-acute  visit.  Level of care skilled.  Facility [facility].   Chief complaint-medical management of chronic medical issues including renal insufficiency-failure to thrive-anemia Hypertension-dementia Acute visit secondary to right sided facial pain suspect dental abscess    History of present illness .  Patient is a very pleasant 82 year old female who actually has  beenquite stable now for some time.  She really came in with failure to thrive issues but she has responded  well during her stay here.  She has occasionally had a UTI-also has renal insufficiency-she has had a high potassium in the past this appears to have stabilized on routine Kayexalate  Several months ago she did have an elevated potassium this was all secondary to the Septra she was receiving for UTI-Septra was discontinued-her Kayexalate dose was reduced to twice a week and this appears to have stabilized although this will warrant updating most recent potassium was 4.8 creatinine 1.39 on lab done in late February -she is eating and drinking pretty well according to nursing staff--  She does have a history of normocytic anemia suspect chronic we will recheck this as well.--Last hemoglobin was 9.5  Dementia-- would say moderate she does ambulate about the facility is usually pleasant and interactive--at times she will have periods of agitation at one point had added Ativan when necessary order but this was discontinued secondary to non use-according in nursing staff there are still times she gets agitated but apparently does better when she is redirected from the source of agitation this appears to occur more in the early evening hours-    She does have a history of hypertension she is on Norvasc and labetalol --his blood pressures appear to be stable-121/78-124/84-   At times patient recently has been  complaining of some right-sided facial pain-she does have numerous cavities-there is some suspicion she may be developing a dental abscess.  She is afebrile     .  Family medical social history has been reviewed per discharge summary on 03/19/2013.--And recent progress notes    Medications have been reviewed per [facility]  Include Tylenol 650 mg every 4 hours when necessary pain. .  Imodium when necessary.  Kayexalate 15 g 2 times a week  Labetalol 200 mg twice a day   Norvasc 5 mg daily Prilosec 20 mg daily .  Review of systems --limited secondary to dementia-obtain from patient and nursing in general no complaints of fever chills .  Skin does not complaining of rashes or itching.  Head ears eyes nose mouth and throat has prescription lenses does not complaining of visual changes-- is complaining of some right-sided facial mouth pain Respiratory no shortness of breath -- Cardiac no chest pain or significant lower extremity edema.  GI does not complaining of any abdominal discomfort nausea vomiting diarrhea or constipation.  GU history UTIs but did not overtly complain of dysuria today  Musculoskeletal does not complaining of joint pain.  Neurologic does not complaining of headaches -or dizziness recently no syncopal episodes -  Psych is a history of dementia but this appears to be relatively stable is doing very well in this setting--with occasional periods of agitation  Physical exam Temperature 98.0 pulse 80 respirations 20 blood pressure 121/78 weight is stable at 113.2  General this is a pleasant elderly female in no distress -sitting comfortably in her  wheelchair  Her skin is warm and dry.  Eyes she has prescription lenses pupils appear reactive to light sclera and conjunctiva are clear.visual acuity appears intact     Throat--oropharynx clear mucous membranes moist.  She does have numerous extractions Right lower side there is some tenderness to palpation of her jaw  area and under the gums  lower right side   Chest is clear to auscultation no labored breathing.  Heart is regular rate and rhythm without murmur gallop or rub.  She does not have significant lower extremity edema.  Abdomen soft nontender with positive bowel sounds.  Musculoskeletal does move all extremities at baseline.--Continues to have a small lump upper right arm this is nontender non-erythematous appears chronic  Neurologic appears grossly intact speech is clear strength is intact at baseline all 4 extremities her speech is clear tongue is midline.-Cranial nerves are intact  Psych she is oriented to self is pleasant and appropriate at her baseline--continues to be in good humor smiling    .  Labs  10/12/2015.  WBC 4.6 hemoglobin 9.5 platelets 227.  Sodium 136 potassium 4.8 BUN 36 creatinine 1.39  06/23/2015.  Sodium 139 potassium 4.5 BUN 31 creatinine 1.34.  06/18/2015-magnesium 1.8.  WBC 5.8 hemoglobin 9.5 platelets 229.  October 2016.  ALT 12-otherwise liver function tests within normal limits  04/14/2015.  Sodium 139 potassium 4.7 BUN 25 creatinine 1.18.  WBC 4.6 hemoglobin 9.3 platelets 234.    01/30/2015.  WBC 3.9 hemoglobin 9.5 platelets 250.  Sodium 136 potassium 5.1 BUN 34 creatinine 1.36-.  Liver function tests within normal limits albumin 3.6   09/30/2014.  Sodium 140 potassium 4.5 BUN 29 creatinine 1.13.  Albumin 3.3 otherwise liver function tests within normal limits.  WBC 5.0 hemoglobin 8.9 platelets 216  07/16/2015.  WBC 3.9 hemoglobin 9.5 platelets 219.  Sodium 140 potassium 4.8 BUN 23 creatinine 1.29  . 03/24/2014.  WBC 3.7-hemoglobin 9.0-platelets 217.  Sodium 140 potassium 4.5 BUN 25 creatinine 1.32.  Liver function tests within normal limits except albumin of 3.3.    01/21/2014.  WBC 3.4 hemoglobin 7.9 platelets 208.  01/18/2014.  Sodium 140 potassium 5 BUN 33 creatinine 1.41.  .  Frequent blood testing in  late May and early June was negative.  01/07/2014.  Iron 59-total iron binding capacity 2:30-vitamin B12 625-folate greater than 20-ferritin elevated at 974.  Reticulocyte 1.5.   Assessment and plan.    Right lower mouth discomfort-suspect she is developing a dental abscess-will start amoxicillin 500 mg twice a day for 10 days in order a dental consult-clinically appears to be stable she is afebrile.  Also will update a CBC with differential and metabolic panel tomorrow.  Will start probiotic twice a day as well      #2-dementia with failure to thrive-this appears to be quite stable she is functioning well in this setting continue supportive care apparently she is eating and drinking fairly well --weights stable.  She is on supplements-   .  #3-renal insufficiency this appears to be somewhat variable  -again some of this was thought to Septra use for UTI previously when her potassium was significantly elevated-this has since normalized will update lab    #4-anemia of chronic disease-will update a CBC--her hemoglobin has been as low as 7.9- appears to have stabilized recently with hemoglobin of 9.5 will update this as well  #5-hypertension-at this point appear relatively stable on labetalol as well as Norvasc-s   .6-history of hyperkalemia ia suspect due to renal insufficiency  she is on Kayexalate 2imes a week --again will update potassium level   [phone]

## 2015-12-18 ENCOUNTER — Encounter: Payer: Self-pay | Admitting: Internal Medicine

## 2015-12-18 ENCOUNTER — Non-Acute Institutional Stay (SKILLED_NURSING_FACILITY): Payer: Medicare Other | Admitting: Internal Medicine

## 2015-12-18 DIAGNOSIS — N289 Disorder of kidney and ureter, unspecified: Secondary | ICD-10-CM | POA: Diagnosis not present

## 2015-12-18 DIAGNOSIS — F03918 Unspecified dementia, unspecified severity, with other behavioral disturbance: Secondary | ICD-10-CM

## 2015-12-18 DIAGNOSIS — I10 Essential (primary) hypertension: Secondary | ICD-10-CM | POA: Diagnosis not present

## 2015-12-18 DIAGNOSIS — E875 Hyperkalemia: Secondary | ICD-10-CM | POA: Diagnosis not present

## 2015-12-18 DIAGNOSIS — F0391 Unspecified dementia with behavioral disturbance: Secondary | ICD-10-CM | POA: Diagnosis not present

## 2015-12-18 NOTE — Progress Notes (Signed)
Patient ID: Morgan [name], female   DOB: 19-May-1921, 82 y.o.   MRN: [id number]

## 2015-12-18 NOTE — Progress Notes (Signed)
Location:  [place] of Service:  SNF (31)  [name], MD  Patient Care Team: [name], MD as PCP - General (Unknown Physician Specialty)  Extended Emergency Contact Information Primary Emergency Contact: [name] Address: [address]          [place], [place] [address] [place] Phone: [phone] Relation: Other Secondary Emergency Contact: [name], [place] [address] [name] of [place] Phone: [phone] Mobile Phone: [phone] Relation: Niece   Goals of care: Advanced Directive information Advanced Directives 12/18/2015  Does patient have an advance directive? Yes  Type of Advance Directive Living will  Does patient want to make changes to advanced directive? No - Patient declined  Copy of advanced directive(s) in chart? Yes     Chief Complaint  Patient presents with  . Medical Management of Chronic Issues  Including dementia-renal insufficiency-hyperkalemia-hypertension  HPI:  Pt is a 82 y.o. female seen today for medical management of chronic diseases. As noted above.  Maureen continues to be quite stable there has not really been any recent acute issues.  She does have a history of renal insufficiency with at times hyperkalemia but this appears to be controlled and stabilized on Kayexalate.  She also has a history of hypertension she is on labetalol 200 mg twice a day as well as Norvasc 5 mg a day recent blood pressures 146/77-136/63-125/61.      Past Medical History  Diagnosis Date  . Hypertension   . Dementia   . Anxiety   . Anemia   . GERD (gastroesophageal reflux disease)   . Hyperpotassemia   . Other severe protein-calorie malnutrition   . Hyperpotassemia   . Dysphagia, oropharyngeal phase    Past Surgical History  Procedure Laterality Date  . Appendectomy    . Abdominal hysterectomy      No Known Allergies  Current Outpatient Prescriptions on File Prior to  Visit  Medication Sig Dispense Refill  . amLODipine (NORVASC) 5 MG tablet Take 5 mg by mouth daily. For HTN    . labetalol (NORMODYNE) 200 MG tablet Take 200 mg by mouth 2 (two) times daily. For HTN    . omeprazole (PRILOSEC) 20 MG capsule Take 20 mg by mouth daily. For GERD    . sodium polystyrene (KAYEXALATE) 15 GM/60ML suspension Take 15 g by mouth 2 (two) times a week. 2 times weekly on  Thursday and Sunday     No current facility-administered medications on file prior to visit.     Review of Systems   Limited secondary to dementia provided by patient and nursing staff.  In general no complaints of fever chills weight appears to be stable.  Skin does not complain of any itching or rashes.  Head ears eyes nose mouth and throat-does not complain of sore throat or visual changes.  Restraint no complaint shortness breath or cough.  Cardiac no chest pain.  Musculoskeletal does not complain of joint pain currently.  GI is not complaining of abdominal discomfort nausea vomiting diarrhea or constipation.  GU is not complaining of dysuria.  Neurologic does not complain of dizziness headache or syncopal-type feelings.  Psych continues with dementia although she is quite pleasant and interactive apparently still has periods of agitation but these do not appear to be frequent from what I can see    Immunization History  Administered Date(s) Administered  . Influenza-Unspecified 05/23/2014   Pertinent  Health Maintenance Due  Topic Date Due  . DEXA SCAN  04/30/1988  . PNA vac Low Risk Adult (1 of 2 - PCV13) 04/30/1988  . INFLUENZA VACCINE  03/16/2016    Functional Status Survey:      Physical Exam   Pressures 96.1 pulse 71 respirations 24 blood pressure 146/77-136/63-125/61-weight is stable at 113.  General this is a frail elderly female in no distress lying comfortably in bed.  Her skin is warm and dry.  Oropharynx clear mucous membranes moist she has numerous  extractions.  Eyes pupils appear reactive to light she does have prescription lenses visual acuity appears grossly intact.  Chest is clear to auscultation there is no labored breathing.  Heart is regular rate and rhythm without murmur gallop or rub she does not have any significant lower extremity edema.  Abdomen is soft nontender positive bowel sounds.  Muscle skeletal moves all extremities 4 ambulates about facility in the wheelchair I did not note any deformities other than arthritic she continues to have a small lump like area upper right arm which is unchanged.  Neurologic is grossly intact her speech is clear no lateralizing findings.  Psych she is oriented to self pleasant and appropriate at times she will call me by name but this varies.    Labs reviewed:  Recent Labs  06/18/15 0730 06/23/15 0615 10/12/15 0455 11/12/15 0745  NA 139 139 136 138  K 5.2* 4.5 4.8 4.4  CL 111 106 104 108  CO2 23 26 25 23   GLUCOSE 92 88 79 94  BUN 22* 31* 36* 28*  CREATININE 1.07* 1.34* 1.39* 1.18*  CALCIUM 9.1 9.1 8.9 9.0  MG 1.8  --   --   --     Recent Labs  01/30/15 0815 05/21/15 0745  AST 20 20  ALT 13* 12*  ALKPHOS 94 87  BILITOT 0.5 0.5  PROT 6.7 6.7  ALBUMIN 3.6 3.6    Recent Labs  06/06/15 0725 10/12/15 0455 11/12/15 0745  WBC 4.4 4.6 8.0  NEUTROABS 1.6* 1.7 5.3  HGB 9.2* 9.5* 9.2*  HCT 28.2* 28.7* 27.4*  MCV 99.3 98.3 97.2  PLT 302 227 277   Lab Results  Component Value Date   TSH 2.336 05/21/2015         Assessment/Plan  #History of dementia-this appears stable with supportive care currently on no medications she had been on Ativan at one point for situational anxiety but this has been discontinued-this will have to be monitored her weight is stable which is encouraging.  #2-history renal insufficiency with hypokalemia-this is been stable for some time creatinine 1.18 in potassium 4.4 on lab done March 29 shows stability we will update this she  continues again on Kayexalate.  #3 history of anemia most likely chronic disease hemoglobin 9.2 on most recent lab appears stable we will update this as well.  #4 history hypertension continues at times have variable systolics but I do not see consistent elevations at this point will monitor she is on labetalol as well as Norvasc.  [id number]

## 2015-12-19 ENCOUNTER — Encounter (HOSPITAL_COMMUNITY)
Admission: RE | Admit: 2015-12-19 | Discharge: 2015-12-19 | Disposition: A | Discharge status: Home / Self Care | Payer: Medicare Other | Source: Skilled Nursing Facility | Attending: Internal Medicine | Admitting: Internal Medicine

## 2015-12-19 DIAGNOSIS — E43 Unspecified severe protein-calorie malnutrition: Secondary | ICD-10-CM | POA: Diagnosis not present

## 2015-12-19 DIAGNOSIS — F0390 Unspecified dementia without behavioral disturbance: Secondary | ICD-10-CM | POA: Diagnosis not present

## 2015-12-19 DIAGNOSIS — Z9181 History of falling: Secondary | ICD-10-CM | POA: Insufficient documentation

## 2015-12-19 DIAGNOSIS — I10 Essential (primary) hypertension: Secondary | ICD-10-CM | POA: Diagnosis present

## 2015-12-19 DIAGNOSIS — R1312 Dysphagia, oropharyngeal phase: Secondary | ICD-10-CM | POA: Diagnosis not present

## 2015-12-19 DIAGNOSIS — D649 Anemia, unspecified: Secondary | ICD-10-CM | POA: Insufficient documentation

## 2015-12-19 LAB — CBC WITH DIFFERENTIAL/PLATELET
Basophils Absolute: 0 10*3/uL (ref 0.0–0.1)
Basophils Relative: 1 %
EOS PCT: 5 %
Eosinophils Absolute: 0.2 10*3/uL (ref 0.0–0.7)
HCT: 29.1 % — ABNORMAL LOW (ref 36.0–46.0)
Hemoglobin: 9.5 g/dL — ABNORMAL LOW (ref 12.0–15.0)
LYMPHS PCT: 29 %
Lymphs Abs: 1.4 10*3/uL (ref 0.7–4.0)
MCH: 32.2 pg (ref 26.0–34.0)
MCHC: 32.6 g/dL (ref 30.0–36.0)
MCV: 98.6 fL (ref 78.0–100.0)
MONO ABS: 0.6 10*3/uL (ref 0.1–1.0)
MONOS PCT: 13 %
Neutro Abs: 2.6 10*3/uL (ref 1.7–7.7)
Neutrophils Relative %: 54 %
PLATELETS: 253 10*3/uL (ref 150–400)
RBC: 2.95 MIL/uL — ABNORMAL LOW (ref 3.87–5.11)
RDW: 13.6 % (ref 11.5–15.5)
WBC: 4.9 10*3/uL (ref 4.0–10.5)

## 2015-12-19 LAB — BASIC METABOLIC PANEL
Anion gap: 6 (ref 5–15)
BUN: 29 mg/dL — AB (ref 6–20)
CO2: 25 mmol/L (ref 22–32)
CREATININE: 1.38 mg/dL — AB (ref 0.44–1.00)
Calcium: 9.2 mg/dL (ref 8.9–10.3)
Chloride: 108 mmol/L (ref 101–111)
GFR calc Af Amer: 37 mL/min — ABNORMAL LOW (ref >60)
GFR calc non Af Amer: 32 mL/min — ABNORMAL LOW (ref >60)
Glucose, Bld: 86 mg/dL (ref 65–99)
Potassium: 4 mmol/L (ref 3.5–5.1)
Sodium: 139 mmol/L (ref 135–145)

## 2016-01-22 ENCOUNTER — Non-Acute Institutional Stay (SKILLED_NURSING_FACILITY): Payer: Medicare Other | Admitting: Internal Medicine

## 2016-01-22 ENCOUNTER — Encounter: Payer: Self-pay | Admitting: Internal Medicine

## 2016-01-22 DIAGNOSIS — F03918 Unspecified dementia, unspecified severity, with other behavioral disturbance: Secondary | ICD-10-CM

## 2016-01-22 DIAGNOSIS — I10 Essential (primary) hypertension: Secondary | ICD-10-CM | POA: Diagnosis not present

## 2016-01-22 DIAGNOSIS — D649 Anemia, unspecified: Secondary | ICD-10-CM

## 2016-01-22 DIAGNOSIS — F0391 Unspecified dementia with behavioral disturbance: Secondary | ICD-10-CM

## 2016-01-22 DIAGNOSIS — N289 Disorder of kidney and ureter, unspecified: Secondary | ICD-10-CM | POA: Diagnosis not present

## 2016-01-22 NOTE — Assessment & Plan Note (Addendum)
CBC and differential Ferritin level B12 level

## 2016-01-22 NOTE — Assessment & Plan Note (Signed)
Blood pressure control is adequate BMET

## 2016-01-22 NOTE — Progress Notes (Signed)
    Facility Location: [place] Room Number: 109-W   This is a nursing facility follow up of chronic medical diagnoses  Interim medical record and care since last [place] visit was updated with review of diagnostic studies and change in clinical status since last visit were documented.  HPI: The patient is a 82 year old resident of the facility with multiple medical issues. These include renal insufficiency; hypertension; failure to thrive and malnutrition; and anemia. Most recent labs were 12/19/15. Creatinine was 1.38, BUN 29, and GFR 37. She exhibited a normochromic normocytic anemia with hematocrit 29.1.  Comprehensive review of systems is negative except for intermittent extrinsic symptoms of sneezing and watery eyes. She states that she has had some cough and asthma in the past Recently she states she had some headaches but these have resolved. The veracity of her responses is in doubt as she is pleasantly demented. She is unable to give me the date or the name of the president and governor. 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 Constitutional: No fever,significant weight change, fatigue  Eyes: No redness, discharge, pain, vision change ENT/mouth: No nasal congestion,  purulent discharge, earache,change in hearing ,sore throat  Cardiovascular: No chest pain, palpitations,paroxysmal nocturnal dyspnea, claudication, edema  Respiratory: No cough, sputum production,hemoptysis, DOE , significant snoring,apnea at this time  Gastrointestinal: No heartburn,dysphagia,abdominal pain, nausea / vomiting,rectal bleeding, melena,change in bowels Genitourinary: No dysuria,hematuria, pyuria,  incontinence, nocturia Musculoskeletal: No joint stiffness, joint swelling, weakness,pain Dermatologic: No rash, pruritus, change in appearance of skin Neurologic: No dizziness,headache,syncope, seizures, numbness , tingling Psychiatric: No significant anxiety , depression,  insomnia, anorexia Endocrine: No change in hair/skin/ nails, excessive thirst, excessive hunger, excessive urination  Hematologic/lymphatic: No significant bruising, lymphadenopathy,abnormal bleeding Allergy/immunology: No urticaria, angioedema at this time  Physical exam:  Pertinent or positive findings: She appears younger than her stated age. She has severe dental hygiene issues with multiple caries. She has minor scattered rales. There is isolated PIP enlargement and lateral deviation of some fingers. Hyperextension of the thumbs is present. Posterior tibial pulses are decreased. As noted she has severe dementia ; but this is not obvious unless asked current events .Her remote memory is much better. General appearance:Thin but adequately nourished; no acute distress , increased work of breathing is present.   Lymphatic: No lymphadenopathy about the head, neck, axilla Eyes: No conjunctival inflammation or lid edema is present. There is no scleral icterus. Ears:  External ear exam shows no significant lesions or deformities.   Nose:  External nasal examination shows no deformity or inflammation. Nasal mucosa are pink and moist without lesions ,exudates Oral exam: lips and gums are healthy appearing.There is no oropharyngeal erythema or exudate . Neck:  No thyromegaly, masses, tenderness noted.    Heart:  Normal rate and regular rhythm. S1 and S2 normal without gallop, murmur, click, rub .  Abdomen:Bowel sounds are normal. Abdomen is soft and nontender with no organomegaly, hernias,masses. GU: deferred. Extremities:  No cyanosis, clubbing,edema  Neurologic exam : Strength equal  in upper & lower extremities but decreased Balance,Rhomberg,finger to nose testing could not be completed due to clinical state Deep tendon reflexes are equal Skin: Warm & dry w/o tenting. No significant lesions or rash.    See summary under each active problem in the Problem List with associated updated  therapeutic plan

## 2016-01-22 NOTE — Patient Instructions (Addendum)
New orders for [employer] entry. CBC and differential  BMET  TSH Iron panel B12 level Present Full CODE STATUS should be re-addressed with her healthcare power of attorney with frank discussions of the realities of CPR in this 82 year old lady with osteoporosis and multiple other comorbidities

## 2016-01-22 NOTE — Assessment & Plan Note (Signed)
No behavioral issues present on today's exam. She is very pleasantly demented

## 2016-01-22 NOTE — Assessment & Plan Note (Signed)
BMET 

## 2016-01-23 ENCOUNTER — Encounter (HOSPITAL_COMMUNITY)
Admission: RE | Admit: 2016-01-23 | Discharge: 2016-01-23 | Disposition: A | Discharge status: Home / Self Care | Payer: Medicare Other | Source: Skilled Nursing Facility | Attending: Internal Medicine | Admitting: Internal Medicine

## 2016-01-23 DIAGNOSIS — D649 Anemia, unspecified: Secondary | ICD-10-CM | POA: Diagnosis present

## 2016-01-23 DIAGNOSIS — F0390 Unspecified dementia without behavioral disturbance: Secondary | ICD-10-CM | POA: Insufficient documentation

## 2016-01-23 DIAGNOSIS — E43 Unspecified severe protein-calorie malnutrition: Secondary | ICD-10-CM | POA: Diagnosis present

## 2016-01-23 DIAGNOSIS — I10 Essential (primary) hypertension: Secondary | ICD-10-CM | POA: Insufficient documentation

## 2016-01-23 DIAGNOSIS — Z9181 History of falling: Secondary | ICD-10-CM | POA: Diagnosis present

## 2016-01-23 DIAGNOSIS — R1312 Dysphagia, oropharyngeal phase: Secondary | ICD-10-CM | POA: Diagnosis present

## 2016-01-23 LAB — CBC WITH DIFFERENTIAL/PLATELET
BASOS PCT: 0 %
Basophils Absolute: 0 10*3/uL (ref 0.0–0.1)
EOS ABS: 0.2 10*3/uL (ref 0.0–0.7)
Eosinophils Relative: 5 %
HEMATOCRIT: 27.3 % — AB (ref 36.0–46.0)
HEMOGLOBIN: 9.1 g/dL — AB (ref 12.0–15.0)
LYMPHS ABS: 1.4 10*3/uL (ref 0.7–4.0)
Lymphocytes Relative: 33 %
MCH: 32.6 pg (ref 26.0–34.0)
MCHC: 33.3 g/dL (ref 30.0–36.0)
MCV: 97.8 fL (ref 78.0–100.0)
MONO ABS: 0.7 10*3/uL (ref 0.1–1.0)
MONOS PCT: 15 %
NEUTROS PCT: 47 %
Neutro Abs: 2.1 10*3/uL (ref 1.7–7.7)
Platelets: 242 10*3/uL (ref 150–400)
RBC: 2.79 MIL/uL — ABNORMAL LOW (ref 3.87–5.11)
RDW: 13.3 % (ref 11.5–15.5)
WBC: 4.4 10*3/uL (ref 4.0–10.5)

## 2016-01-23 LAB — BASIC METABOLIC PANEL
Anion gap: 6 (ref 5–15)
BUN: 38 mg/dL — AB (ref 6–20)
CHLORIDE: 104 mmol/L (ref 101–111)
CO2: 26 mmol/L (ref 22–32)
CREATININE: 1.38 mg/dL — AB (ref 0.44–1.00)
Calcium: 9.1 mg/dL (ref 8.9–10.3)
GFR calc Af Amer: 37 mL/min — ABNORMAL LOW (ref >60)
GFR calc non Af Amer: 32 mL/min — ABNORMAL LOW (ref >60)
GLUCOSE: 85 mg/dL (ref 65–99)
POTASSIUM: 4.4 mmol/L (ref 3.5–5.1)
SODIUM: 136 mmol/L (ref 135–145)

## 2016-01-23 LAB — TSH: TSH: 2.567 u[IU]/mL (ref 0.350–4.500)

## 2016-01-23 LAB — FERRITIN: Ferritin: 611 ng/mL — ABNORMAL HIGH (ref 11–307)

## 2016-01-23 LAB — VITAMIN B12: VITAMIN B 12: 700 pg/mL (ref 180–914)

## 2016-02-11 ENCOUNTER — Non-Acute Institutional Stay (SKILLED_NURSING_FACILITY): Payer: Medicare Other | Admitting: Internal Medicine

## 2016-02-11 ENCOUNTER — Encounter: Payer: Self-pay | Admitting: Internal Medicine

## 2016-02-11 DIAGNOSIS — N289 Disorder of kidney and ureter, unspecified: Secondary | ICD-10-CM

## 2016-02-11 DIAGNOSIS — F0391 Unspecified dementia with behavioral disturbance: Secondary | ICD-10-CM | POA: Diagnosis not present

## 2016-02-11 DIAGNOSIS — I10 Essential (primary) hypertension: Secondary | ICD-10-CM | POA: Diagnosis not present

## 2016-02-11 DIAGNOSIS — F03918 Unspecified dementia, unspecified severity, with other behavioral disturbance: Secondary | ICD-10-CM

## 2016-02-11 NOTE — Progress Notes (Signed)
Location:   [facility] Room Number: 109/W Place of Service:  SNF (31) Provider:  Leeanne Deed, MD  Patient Care Team: Hendricks Limes, MD as PCP - General (Internal Medicine)  Extended Emergency Contact Information Primary Emergency Contact: High,[name] Address: [address]          [place], [place] [address] [place] Phone: [phone] Relation: Other Secondary Emergency Contact: [name], [place] [address] [name] of [place] Phone: [phone] Mobile Phone: [phone] Relation: Niece  Code Status:  Full Code Goals of care: Advanced Directive information Advanced Directives 02/11/2016  Does patient have an advance directive? Yes  Type of Advance Directive -  Does patient want to make changes to advanced directive? No - Patient declined  Copy of advanced directive(s) in chart? Yes     Chief Complaint  Patient presents with  . Medical Management of Chronic Issues    Routine visit  Medical management of chronic medical issues including renal insufficiency dementia failure to thrive hypertension anemia  HPI:  Pt is a 82 y.o. female seen today for medical management of above issues-she has continued to enjoy to. A prolonged stability.  She was a came in with failure to thrive issues but she is responded very well during her stay here.  She is also occasionally has UTIs in this usually quickly resolved with antibiotic.  She does have a history of renal insufficiency with hyperkalemia in the past this appears stabilized on routine Kayexalate most recent creatinine was 1.38 potassium 4.4 on 01/23/2016 we will update this.  At one point she received Septra for UTI and did have significant elevation of her potassium but this is not really reoccurred.  She drinks and eats fairly well according to nursing staff her weight appears to be stable at 114.2.  She does have a history of normocytic anemia  suspect an element of chronicity this is been stable in the low 90s recently.  In regards to dementia this is moderate day usually pleasant and interactive apparently does have periods of agitation more so at night but apparently has not required this much recently.  Regards to hypertension she is on Norvasc and labetalol this appears stable recent blood pressure 139/74 which appears to be relatively baseline.  She continues to ambulate about the facility in her wheelchair has pleasant confusion-although at times she will actually call me out by name.     Past Medical History  Diagnosis Date  . Hypertension   . Dementia   . Anxiety   . Anemia   . GERD (gastroesophageal reflux disease)   . Hyperpotassemia   . Other severe protein-calorie malnutrition   . Hyperpotassemia   . Dysphagia, oropharyngeal phase    Past Surgical History  Procedure Laterality Date  . Appendectomy    . Abdominal hysterectomy      No Known Allergies  Current Outpatient Prescriptions on File Prior to Visit  Medication Sig Dispense Refill  . amLODipine (NORVASC) 5 MG tablet Take 5 mg by mouth daily. For HTN    . labetalol (NORMODYNE) 200 MG tablet Take 200 mg by mouth 2 (two) times daily. For HTN    . omeprazole (PRILOSEC) 20 MG capsule Take 20 mg by mouth daily. For GERD    . sodium polystyrene (KAYEXALATE) 15 GM/60ML suspension Take 15 g by mouth 2 (two) times a week. 2 times weekly on  Thursday and Sunday  No current facility-administered medications on file prior to visit.     Review of Systems   This is limited secondary to dementia but by nursing staff as well.  General no complaints of fever or chills.  Skin no complaints of rashes or itching.  Head ears eyes nose mouth and throat does not complain sore throat nasal discharge or visual changes.  Her spray does not complain of shortness breath or cough.  Cardiac no chest pain or significant lower extremity edema.  GI is not  complaining of any nausea vomiting diarrhea constipation or abdominal pain.  Musculoskeletal is not complaining of ointment pain.  Neurologic does not complain of headache dizziness or syncopal episodes at this time apparently at times has complained of headaches.  Psych does not complaining of anxiety or depression apparently as some agitation at times this appears to be rather sporadic.    Immunization History  Administered Date(s) Administered  . Influenza-Unspecified 05/23/2014   Pertinent  Health Maintenance Due  Topic Date Due  . DEXA SCAN  02/10/2017 (Originally [birth year]30/1988)  . PNA vac Low Risk Adult (1 of 2 - PCV13) 02/10/2017 (Originally 04/30/1986)  . INFLUENZA VACCINE  03/16/2016   No flowsheet data found. Functional Status Survey:    Filed Vitals:   02/11/16 1305  BP: 139/74  Pulse: 66  Temp: 98.3 F (36.8 C)  TempSrc: Oral  Resp: 20  Height: 5\' 1"  (1.549 m)  Weight: 114 lb 4.8 oz (51.846 kg)   Body mass index is 21.61 kg/(m^2). Physical Exam  Labs reviewed:  Recent Labs  06/18/15 0730  11/12/15 0745 12/19/15 0745 01/23/16 0700  NA 139  < > 138 139 136  K 5.2*  < > 4.4 4.0 4.4  CL 111  < > 108 108 104  CO2 23  < > 23 25 26   GLUCOSE 92  < > 94 86 85  BUN 22*  < > 28* 29* 38*  CREATININE 1.07*  < > 1.18* 1.38* 1.38*  CALCIUM 9.1  < > 9.0 9.2 9.1  MG 1.8  --   --   --   --   < > = values in this interval not displayed.  Recent Labs  05/21/15 0745  AST 20  ALT 12*  ALKPHOS 87  BILITOT 0.5  PROT 6.7  ALBUMIN 3.6    Recent Labs  11/12/15 0745 12/19/15 0745 01/23/16 0700  WBC 8.0 4.9 4.4  NEUTROABS 5.3 2.6 2.1  HGB 9.2* 9.5* 9.1*  HCT 27.4* 29.1* 27.3*  MCV 97.2 98.6 97.8  PLT 277 253 242   Lab Results  Component Value Date   TSH 2.567 01/23/2016   No results found for: HGBA1C No results found for: CHOL, HDL, LDLCALC, LDLDIRECT, TRIG, CHOLHDL  Significant Diagnostic Results in last 30 days:  No results  found.  Assessment/Plan  1 history of dementia with failure to thrive this appears to be quite stable she is functioning well with supportive care eating and drinking apparently somewhat sporadically but nonetheless weight is stable clinically appears to be doing well.  #2 history renal insufficiency with hyperkalemia-this is been stable now that she is on Kayexalate and creatinine 1.31 Will update this.  #3 history of anemia of chronic disease hemoglobin appears stable at 9.1 on lab done earlier this month.  #4 hypertension this appears stable on Norvasc as well as labetalol.  #5 history of GERD this appears stable on Prilosec.  [id number]         Lolita Cram,  Janett Billow, [place]

## 2016-02-12 ENCOUNTER — Encounter (HOSPITAL_COMMUNITY)
Admission: RE | Admit: 2016-02-12 | Discharge: 2016-02-12 | Disposition: A | Discharge status: Home / Self Care | Payer: Medicare Other | Source: Skilled Nursing Facility | Attending: Internal Medicine | Admitting: Internal Medicine

## 2016-02-12 DIAGNOSIS — D649 Anemia, unspecified: Secondary | ICD-10-CM | POA: Diagnosis not present

## 2016-02-12 LAB — BASIC METABOLIC PANEL
ANION GAP: 4 — AB (ref 5–15)
BUN: 44 mg/dL — ABNORMAL HIGH (ref 6–20)
CALCIUM: 9.2 mg/dL (ref 8.9–10.3)
CHLORIDE: 107 mmol/L (ref 101–111)
CO2: 25 mmol/L (ref 22–32)
Creatinine, Ser: 1.37 mg/dL — ABNORMAL HIGH (ref 0.44–1.00)
GFR calc non Af Amer: 32 mL/min — ABNORMAL LOW (ref >60)
GFR, EST AFRICAN AMERICAN: 38 mL/min — AB (ref >60)
Glucose, Bld: 87 mg/dL (ref 65–99)
Potassium: 4.6 mmol/L (ref 3.5–5.1)
Sodium: 136 mmol/L (ref 135–145)

## 2016-03-08 LAB — BASIC METABOLIC PANEL: Glucose: 81 mg/dL

## 2016-03-15 LAB — BASIC METABOLIC PANEL: GLUCOSE: 84 mg/dL

## 2016-03-18 ENCOUNTER — Non-Acute Institutional Stay (SKILLED_NURSING_FACILITY): Payer: Medicare Other | Admitting: Internal Medicine

## 2016-03-18 ENCOUNTER — Encounter: Payer: Self-pay | Admitting: Internal Medicine

## 2016-03-18 DIAGNOSIS — F03918 Unspecified dementia, unspecified severity, with other behavioral disturbance: Secondary | ICD-10-CM

## 2016-03-18 DIAGNOSIS — N289 Disorder of kidney and ureter, unspecified: Secondary | ICD-10-CM

## 2016-03-18 DIAGNOSIS — D649 Anemia, unspecified: Secondary | ICD-10-CM

## 2016-03-18 DIAGNOSIS — F0391 Unspecified dementia with behavioral disturbance: Secondary | ICD-10-CM | POA: Diagnosis not present

## 2016-03-18 DIAGNOSIS — I10 Essential (primary) hypertension: Secondary | ICD-10-CM

## 2016-03-18 DIAGNOSIS — E875 Hyperkalemia: Secondary | ICD-10-CM | POA: Diagnosis not present

## 2016-03-18 NOTE — Progress Notes (Signed)
[facility] Room Number: 109 Place of Service:  SNF (31) Provider:  Dr. Lyndel Safe  Extended Emergency Contact Information Primary Emergency Contact: High,[name] Address: [address]          [place], [place] [address] [place] Phone: [phone] Relation: Other Secondary Emergency Contact: [name],  [address] [name] of [place] Phone: [phone] Mobile Phone: [phone] Relation: Niece  Code Status:  Full Code Goals of care: Advanced Directive information Advanced Directives 03/18/2016  Does patient have an advance directive? Yes  Type of Advance Directive (No Data)  Does patient want to make changes to advanced directive? No - Patient declined  Copy of advanced directive(s) in chart? Yes  Pre-existing out of facility DNR order (yellow form or pink MOST form) -     Chief Complaint  Patient presents with  . Medical Management of Chronic Issues    Medical Management of Chronic Issues    HPI:  Pt is a 82 y.o. female seen today for medical management of chronic diseases. Got most of the history from the patient. Patient She was in her bed and looked very comfortable. She did not have any complains.No Chest pain , Cough, SOB. No C/O abdominal pain, nausea or vomiting. She says her bowels are moving well and her appetite is good.     Past Medical History:  Diagnosis Date  . Anemia   . Anxiety   . Dementia   . Dysphagia, oropharyngeal phase   . GERD (gastroesophageal reflux disease)   . Hyperpotassemia   . Hyperpotassemia   . Hypertension   . Other severe protein-calorie malnutrition    Past Surgical History:  Procedure Laterality Date  . ABDOMINAL HYSTERECTOMY    . APPENDECTOMY      No Known Allergies    Medication List    Notice   This visit is during an admission. Changes to the med list made in this visit will be reflected in the After Visit Summary of the admission.     Review of  Systems  Constitutional: Negative for appetite change, chills, fatigue and fever.  HENT: Negative.   Respiratory: Negative for cough, chest tightness and shortness of breath.   Cardiovascular: Negative for chest pain and leg swelling.  Musculoskeletal: Negative.   Psychiatric/Behavioral: Positive for confusion. Negative for agitation and behavioral problems.    Immunization History  Administered Date(s) Administered  . Influenza-Unspecified 05/23/2014   Pertinent  Health Maintenance Due  Topic Date Due  . INFLUENZA VACCINE  03/16/2016  . DEXA SCAN  02/10/2017 (Originally 04/30/1986)  . PNA vac Low Risk Adult (1 of 2 - PCV13) 02/10/2017 (Originally 04/30/1986)   No flowsheet data found. Functional Status Survey:    Vitals:   03/18/16 0917  BP: 133/82  Pulse: 75  Resp: (!) 22  Temp: 98 F (36.7 C)  TempSrc: Oral  SpO2: 100%  Weight: 113 lb (51.3 kg)  Height: 5\' 5"  (1.651 m)   Body mass index is 18.8 kg/m. Physical Exam  Constitutional: She appears well-nourished.  HENT:  Head: Normocephalic.  Neck: Neck supple.  Cardiovascular: Normal rate and regular rhythm.   Pulmonary/Chest: Breath sounds normal.  Abdominal: Bowel sounds are normal. There is no tenderness. There is no rebound.  Neurological: She is alert.  Patient was alert but pleasantly demented. Did not know the place time or person.    Labs reviewed:  Recent Labs  06/18/15  0730  12/19/15 0745 01/23/16 0700 02/12/16 0710  NA 139  < > 139 136 136  K 5.2*  < > 4.0 4.4 4.6  CL 111  < > 108 104 107  CO2 23  < > 25 26 25   GLUCOSE 92  < > 86 85 87  BUN 22*  < > 29* 38* 44*  CREATININE 1.07*  < > 1.38* 1.38* 1.37*  CALCIUM 9.1  < > 9.2 9.1 9.2  MG 1.8  --   --   --   --   < > = values in this interval not displayed.  Recent Labs  05/21/15 0745  AST 20  ALT 12*  ALKPHOS 87  BILITOT 0.5  PROT 6.7  ALBUMIN 3.6    Recent Labs  11/12/15 0745 12/19/15 0745 01/23/16 0700  WBC 8.0 4.9 4.4    NEUTROABS 5.3 2.6 2.1  HGB 9.2* 9.5* 9.1*  HCT 27.4* 29.1* 27.3*  MCV 97.2 98.6 97.8  PLT 277 253 242   Lab Results  Component Value Date   TSH 2.567 01/23/2016   Significant Diagnostic Results in last 30 days:  No results found.  Assessment/Plan  1. Hypertension  Patient BP has been well controlled. Will continue same meds. 2. Hyperkalemia. Due to CRI. Patient last Potassium was 4.6. Continue to monitor BMP 3. Anemia secondary to Renal insufficiency Stable. Last HGB was 9.1 4 Chronic renal insufficiency Last BUN on 06/17 was 44 and creat was 1.3 Will encourage fluids. Check BMP. 5 Dementia. Continue supportive treatment.

## 2016-03-22 ENCOUNTER — Other Ambulatory Visit (HOSPITAL_COMMUNITY)
Admission: RE | Admit: 2016-03-22 | Discharge: 2016-03-22 | Disposition: A | Discharge status: Home / Self Care | Payer: Medicare Other | Source: Skilled Nursing Facility | Attending: Internal Medicine | Admitting: Internal Medicine

## 2016-03-22 DIAGNOSIS — I10 Essential (primary) hypertension: Secondary | ICD-10-CM | POA: Insufficient documentation

## 2016-03-22 LAB — BASIC METABOLIC PANEL
ANION GAP: 10 (ref 5–15)
BUN: 35 mg/dL — AB (ref 6–20)
CALCIUM: 9 mg/dL (ref 8.9–10.3)
CO2: 24 mmol/L (ref 22–32)
Chloride: 102 mmol/L (ref 101–111)
Creatinine, Ser: 1.37 mg/dL — ABNORMAL HIGH (ref 0.44–1.00)
GFR calc Af Amer: 38 mL/min — ABNORMAL LOW (ref >60)
GFR, EST NON AFRICAN AMERICAN: 32 mL/min — AB (ref >60)
Glucose, Bld: 82 mg/dL (ref 65–99)
POTASSIUM: 4.6 mmol/L (ref 3.5–5.1)
SODIUM: 136 mmol/L (ref 135–145)

## 2016-04-26 ENCOUNTER — Non-Acute Institutional Stay (SKILLED_NURSING_FACILITY): Payer: Medicare Other | Admitting: Internal Medicine

## 2016-04-26 DIAGNOSIS — F0391 Unspecified dementia with behavioral disturbance: Secondary | ICD-10-CM

## 2016-04-26 DIAGNOSIS — I10 Essential (primary) hypertension: Secondary | ICD-10-CM | POA: Diagnosis not present

## 2016-04-26 DIAGNOSIS — N289 Disorder of kidney and ureter, unspecified: Secondary | ICD-10-CM

## 2016-04-26 DIAGNOSIS — D649 Anemia, unspecified: Secondary | ICD-10-CM | POA: Diagnosis not present

## 2016-04-26 DIAGNOSIS — F03918 Unspecified dementia, unspecified severity, with other behavioral disturbance: Secondary | ICD-10-CM

## 2016-04-26 NOTE — Progress Notes (Signed)
This is a  rputine  evisit.   Level of care is skilled.  Facility is [facility] Complaint  Patient presents with  . Medical Management of Chronic Issues    Routine visit  Medical management of chronic medical issues including renal insufficiency dementia failure to thrive hypertension anemia  HPI:  Pt is a 82 y.o. female seen today for medical management of above issues-she has continued to enjoy to a prolonged .period of stabiliyt  She  came in with failure to thrive issues but she is responded very well during her stay here weight has stabilized at around the 114 pounds.  She is also occasionally has UTIs in this usually quickly resolved with antibiotic.  She does have a history of renal insufficiency with hyperkalemia in the past this appears stabilized on routine Kayexalate most recent creatinine was 1.37 potassium 4.6 on 03/22/2016 we will update this.  At one point she received Septra for UTI and did have significant elevation of her potassium but this is not really reoccurred.  She drinks and eats fairly well according to nursing staff her weight appears to be stable at 114.  She does have a history of normocytic anemia suspect an element of chronicity this is been stable at 9.1  In regards to dementia this is moderate day usually pleasant and interactive apparently does have periods of agitation more so at night but apparently has not required this much recently.  Regards to hypertension she is on Norvasc and labetalol this appears stable blood pressure taken today manually was 120/64 and this is been relatively stable with previous readings   She continues to ambulate about the facility in her wheelchair has pleasant confusion-although at times she will actually call me out by name.         Past Medical History  Diagnosis Date  . Hypertension   . Dementia   . Anxiety   . Anemia   . GERD (gastroesophageal reflux disease)   .  Hyperpotassemia   . Other severe protein-calorie malnutrition   . Hyperpotassemia   . Dysphagia, oropharyngeal phase         Past Surgical History  Procedure Laterality Date  . Appendectomy    . Abdominal hysterectomy      No Known Allergies        Current Outpatient Prescriptions on File Prior to Visit  Medication Sig Dispense Refill  . amLODipine (NORVASC) 5 MG tablet Take 5 mg by mouth daily. For HTN    . labetalol (NORMODYNE) 200 MG tablet Take 200 mg by mouth 2 (two) times daily. For HTN    . omeprazole (PRILOSEC) 20 MG capsule Take 20 mg by mouth daily. For GERD    . sodium polystyrene (KAYEXALATE) 15 GM/60ML suspension Take 15 g by mouth 2 (two) times a week. 2 times weekly on  Thursday and Sunday     No current facility-administered medications on file prior to visit.     Review of Systems   This is limited secondary to dementia but by nursing staff as well.  General no complaints of fever or chills.  Skin no complaints of rashes or itching.  Head ears eyes nose mouth and throat does not complain sore throat nasal discharge or visual changes.  Resp-- does not complain of shortness breath or cough.  Cardiac no chest pain or significant lower extremity edema.  GI is not complaining of any nausea vomiting diarrhea constipation or abdominal  pain.  Musculoskeletal is not complaining of ointment pain.  Neurologic does not complain of headache dizziness or syncopal episodes at this time apparently at times has complained of headaches.  Psych does not complaining of anxiety or depression apparently as some agitation at times this appears to be rather sporadic.        Immunization History  Administered Date(s) Administered  . Influenza-Unspecified 05/23/2014       Pertinent  Health Maintenance Due  Topic Date Due  . DEXA SCAN  02/10/2017 (Originally 04[birth year]30/1988)  . PNA vac Low Risk Adult (1 of 2 - PCV13) 02/10/2017  (Originally 04/30/1986)  . INFLUENZA VACCINE  03/16/2016   No flowsheet data found. Functional Status Survey:    She is afebrile pulse 72 respirations 18 blood pressure 120/64 weight is stable 114 pounds  Body mass index is 21.61 kg/(m^2). Physical Exam  In general this is a pleasant elderly female who is quite frail but talkative very pleasant.  Her skin is warm and dry.  Eyes she has prescription lenses pupils appear reactive to light visual acuity appears grossly intact.  Oropharynx is clear mucous membranes moist she has numerous extractions.  Chest is clear to auscultation there is no labored breathing.  Heart is regular rate and rhythm without murmur gallop or rub she does not have significant lower extremity edema.  Abdomen is soft nontender with positive bowel sounds.  Muscle skeletal has general frailty but ambulates well in a wheelchair I do not note any deformities other than arthritic and she continues to have somewhat of a lump on her right upper arm question possible biceps abnormality but this does not bother her.  Neurologic is grossly intact her speech is clear no lateralizing findings.  Psych she is oriented to self pleasant and appropriate has dementia but at times can call me out by name-her behaviors appear to be more confined to the evening somewhat agitated at times wanting to go home   Labs reviewed:  03/22/2016.  Sodium 136 potassium 4.6 BUN 35 creatinine 1.37.    Recent Labs (within last 365 days)   Recent Labs  06/18/15 0730  11/12/15 0745 12/19/15 0745 01/23/16 0700  NA 139  < > 138 139 136  K 5.2*  < > 4.4 4.0 4.4  CL 111  < > 108 108 104  CO2 23  < > 23 25 26   GLUCOSE 92  < > 94 86 85  BUN 22*  < > 28* 29* 38*  CREATININE 1.07*  < > 1.18* 1.38* 1.38*  CALCIUM 9.1  < > 9.0 9.2 9.1  MG 1.8  --   --   --   --   < > = values in this interval not displayed.    Recent Labs (within last 365 days)   Recent Labs   05/21/15 0745  AST 20  ALT 12*  ALKPHOS 87  BILITOT 0.5  PROT 6.7  ALBUMIN 3.6      Recent Labs (within last 365 days)   Recent Labs  11/12/15 0745 12/19/15 0745 01/23/16 0700  WBC 8.0 4.9 4.4  NEUTROABS 5.3 2.6 2.1  HGB 9.2* 9.5* 9.1*  HCT 27.4* 29.1* 27.3*  MCV 97.2 98.6 97.8  PLT 277 253 242     Recent Labs       Lab Results  Component Value Date   TSH 2.567 01/23/2016     Recent Labs  No results found for: HGBA1C   Recent Labs  No results found  for: CHOL, HDL, LDLCALC, LDLDIRECT, TRIG, CHOLHDL    Significant Diagnostic Results in last 30 days:  Imaging Results  No results found.    Assessment/Plan  1 history of dementia with failure to thrive this appears to be quite stable she is functioning well with supportive care eating and drinking apparently somewhat sporadically but nonetheless weight is stable clinically appears to be doing well.  #2 history renal insufficiency with hyperkalemia-this is been stable now that she is on Kayexalate and creatinine 1.37 Will update this.  #3 history of anemia of chronic disease hemoglobin appears stable at 9.1 on lab done June 2017 will update this suspect this is secondary to chronic renal disease   #4 hypertension this appears stable on Norvasc as well as labetalol.  #5 history of GERD this appears stable on Prilosec.  [id number]

## 2016-04-27 ENCOUNTER — Encounter (HOSPITAL_COMMUNITY)
Admission: RE | Admit: 2016-04-27 | Discharge: 2016-04-27 | Disposition: A | Discharge status: Home / Self Care | Payer: Medicare Other | Source: Skilled Nursing Facility | Attending: Internal Medicine | Admitting: Internal Medicine

## 2016-04-27 DIAGNOSIS — D649 Anemia, unspecified: Secondary | ICD-10-CM | POA: Diagnosis present

## 2016-04-27 DIAGNOSIS — Z9181 History of falling: Secondary | ICD-10-CM | POA: Diagnosis present

## 2016-04-27 DIAGNOSIS — E43 Unspecified severe protein-calorie malnutrition: Secondary | ICD-10-CM | POA: Diagnosis present

## 2016-04-27 DIAGNOSIS — I10 Essential (primary) hypertension: Secondary | ICD-10-CM | POA: Diagnosis not present

## 2016-04-27 DIAGNOSIS — F0390 Unspecified dementia without behavioral disturbance: Secondary | ICD-10-CM | POA: Diagnosis present

## 2016-04-27 DIAGNOSIS — R1312 Dysphagia, oropharyngeal phase: Secondary | ICD-10-CM | POA: Diagnosis present

## 2016-04-27 LAB — CBC WITH DIFFERENTIAL/PLATELET
BASOS ABS: 0 10*3/uL (ref 0.0–0.1)
Basophils Relative: 0 %
EOS ABS: 0.2 10*3/uL (ref 0.0–0.7)
EOS PCT: 5 %
HCT: 27.6 % — ABNORMAL LOW (ref 36.0–46.0)
Hemoglobin: 9.2 g/dL — ABNORMAL LOW (ref 12.0–15.0)
LYMPHS ABS: 1.7 10*3/uL (ref 0.7–4.0)
Lymphocytes Relative: 33 %
MCH: 32.9 pg (ref 26.0–34.0)
MCHC: 33.3 g/dL (ref 30.0–36.0)
MCV: 98.6 fL (ref 78.0–100.0)
MONO ABS: 0.6 10*3/uL (ref 0.1–1.0)
Monocytes Relative: 11 %
Neutro Abs: 2.7 10*3/uL (ref 1.7–7.7)
Neutrophils Relative %: 51 %
PLATELETS: 232 10*3/uL (ref 150–400)
RBC: 2.8 MIL/uL — AB (ref 3.87–5.11)
RDW: 13.2 % (ref 11.5–15.5)
WBC: 5.2 10*3/uL (ref 4.0–10.5)

## 2016-04-27 LAB — BASIC METABOLIC PANEL
Anion gap: 5 (ref 5–15)
BUN: 42 mg/dL — AB (ref 6–20)
CALCIUM: 9.2 mg/dL (ref 8.9–10.3)
CO2: 26 mmol/L (ref 22–32)
Chloride: 106 mmol/L (ref 101–111)
Creatinine, Ser: 1.36 mg/dL — ABNORMAL HIGH (ref 0.44–1.00)
GFR calc Af Amer: 38 mL/min — ABNORMAL LOW (ref >60)
GFR, EST NON AFRICAN AMERICAN: 33 mL/min — AB (ref >60)
Glucose, Bld: 84 mg/dL (ref 65–99)
POTASSIUM: 4.5 mmol/L (ref 3.5–5.1)
SODIUM: 137 mmol/L (ref 135–145)

## 2016-05-25 ENCOUNTER — Encounter: Payer: Self-pay | Admitting: Internal Medicine

## 2016-05-25 ENCOUNTER — Non-Acute Institutional Stay (SKILLED_NURSING_FACILITY): Payer: Medicare Other | Admitting: Internal Medicine

## 2016-05-25 DIAGNOSIS — E875 Hyperkalemia: Secondary | ICD-10-CM | POA: Diagnosis not present

## 2016-05-25 DIAGNOSIS — I10 Essential (primary) hypertension: Secondary | ICD-10-CM | POA: Diagnosis not present

## 2016-05-25 DIAGNOSIS — D631 Anemia in chronic kidney disease: Secondary | ICD-10-CM

## 2016-05-25 DIAGNOSIS — N183 Chronic kidney disease, stage 3 unspecified: Secondary | ICD-10-CM

## 2016-05-25 DIAGNOSIS — K219 Gastro-esophageal reflux disease without esophagitis: Secondary | ICD-10-CM | POA: Diagnosis not present

## 2016-05-25 DIAGNOSIS — F0391 Unspecified dementia with behavioral disturbance: Secondary | ICD-10-CM | POA: Diagnosis not present

## 2016-05-25 DIAGNOSIS — N289 Disorder of kidney and ureter, unspecified: Secondary | ICD-10-CM | POA: Diagnosis not present

## 2016-05-25 NOTE — Progress Notes (Signed)
Location:   [facility] Room Number: 109/W Place of Service:  SNF (31) Provider:  Adithya Difrancesco,[employer]  No primary care provider on file.  Patient Care Team: [name], MD as Consulting Physician (Geriatric Medicine)  Extended Emergency Contact Information Primary Emergency Contact: High,[name] Address: [address]          [place], [place] [address] [place] Phone: [phone] Relation: Other Secondary Emergency Contact: [name], [place] [address] [name] of [place] Phone: [phone] Mobile Phone: [phone] Relation: Niece  Code Status:  Full Code Goals of care: Advanced Directive information Advanced Directives 05/25/2016  Does patient have an advance directive? Yes  Type of Advance Directive (No Data)  Does patient want to make changes to advanced directive? No - Patient declined  Copy of advanced directive(s) in chart? Yes  Pre-existing out of facility DNR order (yellow form or pink MOST form) -     Chief Complaint  Patient presents with  . Medical Management of Chronic Issues    Routine Visit    HPI:  Pt is a 82 y.o. female seen today for medical management of chronic diseases.  Patient has h/o Hyperkalemia secondary to Chronic renal insufficiency, hypertension, anemia secondary  And dementia without any behavior issues. Patient has been clinically stable. She did not have any new complains. Patient is not very reliable due to her dementia.    Past Medical History:  Diagnosis Date  . Anemia   . Anxiety   . Dementia   . Dysphagia, oropharyngeal phase   . GERD (gastroesophageal reflux disease)   . Hyperpotassemia   . Hyperpotassemia   . Hypertension   . Other severe protein-calorie malnutrition    Past Surgical History:  Procedure Laterality Date  . ABDOMINAL HYSTERECTOMY    . APPENDECTOMY      No Known Allergies  Current Outpatient Prescriptions on File Prior to Visit    Medication Sig Dispense Refill  . amLODipine (NORVASC) 5 MG tablet Take 5 mg by mouth daily. For HTN    . labetalol (NORMODYNE) 200 MG tablet Take 200 mg by mouth 2 (two) times daily. For HTN    . omeprazole (PRILOSEC) 20 MG capsule Take 20 mg by mouth daily. For GERD    . sodium polystyrene (KAYEXALATE) 15 GM/60ML suspension Take 15 g by mouth 2 (two) times a week. 2 times weekly on  Thursday and Sunday     No current facility-administered medications on file prior to visit.     Review of Systems  Constitutional: Negative for activity change, appetite change, chills, diaphoresis, fatigue and unexpected weight change.  HENT: Negative for congestion, dental problem, drooling, ear discharge, ear pain, facial swelling, hearing loss, mouth sores, nosebleeds, postnasal drip and rhinorrhea.   Respiratory: Negative for apnea, cough, choking, chest tightness, shortness of breath, wheezing and stridor.   Cardiovascular: Negative.   Gastrointestinal: Negative.   Musculoskeletal: Negative.   Psychiatric/Behavioral: Negative.     Immunization History  Administered Date(s) Administered  . Influenza-Unspecified 05/23/2014, 05/18/2016   Pertinent  Health Maintenance Due  Topic Date Due  . DEXA SCAN  02/10/2017 (Originally 04[birth year]30/1986)  . PNA vac Low Risk Adult (1 of 2 - PCV13) 02/10/2017 (Originally 04/30/1986)  . INFLUENZA VACCINE  Completed   No flowsheet data found. Functional Status Survey:    Vitals:   05/25/16 1117  BP: (!) 119/58  Pulse: 77  Resp: 20  Temp:  97.3 F (36.3 C)  TempSrc: Oral  SpO2: 99%   There is no height or weight on file to calculate BMI. Physical Exam  Constitutional: She appears well-developed and well-nourished.  HENT:  Head: Normocephalic.  Mouth/Throat: Oropharynx is clear and moist.  Cardiovascular: Normal rate, regular rhythm and normal heart sounds.   Pulmonary/Chest: Effort normal and breath sounds normal. No respiratory distress. She has no wheezes.  She has no rales.  Abdominal: Soft. Bowel sounds are normal. There is no tenderness. There is no rebound and no guarding.  Musculoskeletal: She exhibits no edema.  Neurological: She is alert.  Not oriented to time place or person.    Labs reviewed:  Recent Labs  06/18/15 0730  02/12/16 0710 03/22/16 0545 04/27/16 0500  NA 139  < > 136 136 137  K 5.2*  < > 4.6 4.6 4.5  CL 111  < > 107 102 106  CO2 23  < > 25 24 26   GLUCOSE 92  < > 87 82 84  BUN 22*  < > 44* 35* 42*  CREATININE 1.07*  < > 1.37* 1.37* 1.36*  CALCIUM 9.1  < > 9.2 9.0 9.2  MG 1.8  --   --   --   --   < > = values in this interval not displayed. No results for input(s): AST, ALT, ALKPHOS, BILITOT, PROT, ALBUMIN in the last 8760 hours.  Recent Labs  12/19/15 0745 01/23/16 0700 04/27/16 0500  WBC 4.9 4.4 5.2  NEUTROABS 2.6 2.1 2.7  HGB 9.5* 9.1* 9.2*  HCT 29.1* 27.3* 27.6*  MCV 98.6 97.8 98.6  PLT 253 242 232   Lab Results  Component Value Date   TSH 2.567 01/23/2016   No results found for: HGBA1C No results found for: CHOL, HDL, LDLCALC, LDLDIRECT, TRIG, CHOLHDL  Significant Diagnostic Results in last 30 days:  No results found.  Assessment/Plan Essential hypertension Blood Pressure well controlled.. On Norvasc and labetolol  Renal insufficiency Creat stable at 1.36. Patient BUN stays mildly elevated with good appetite.  Hyperkalemia Patient potassium stable on Kayexalate.  GERD Patient has been on Prilosec for now 2 years. Will change it to Zantac.  Anemia in stage 3 chronic kidney disease Hgb Stable at 9.2 Dementia Continue supportive care.      Family/ staff Communication:   Labs/tests ordered:

## 2016-05-31 ENCOUNTER — Non-Acute Institutional Stay (SKILLED_NURSING_FACILITY): Payer: Medicare Other | Admitting: Internal Medicine

## 2016-05-31 ENCOUNTER — Encounter: Payer: Self-pay | Admitting: Internal Medicine

## 2016-05-31 DIAGNOSIS — R110 Nausea: Secondary | ICD-10-CM | POA: Diagnosis not present

## 2016-05-31 DIAGNOSIS — I10 Essential (primary) hypertension: Secondary | ICD-10-CM | POA: Diagnosis not present

## 2016-05-31 NOTE — Progress Notes (Signed)
Location:   [facility] Room Number: 109/W Place of Service:  SNF (31) Provider:  Granville Lewis  No primary care provider on file.  Patient Care Team: [name], MD as Consulting Physician (Geriatric Medicine)  Extended Emergency Contact Information Primary Emergency Contact: [name] Address: [address]          [place], [place] [address] [place] Phone: [phone] Relation: Other Secondary Emergency Contact: [name], [place] [address] [name] of [place] Phone: [phone] Mobile Phone: [phone] Relation: Niece  Code Status:  Full Code Goals of care: Advanced Directive information Advanced Directives 05/31/2016  Does patient have an advance directive? Yes  Type of Advance Directive (No Data)  Does patient want to make changes to advanced directive? No - Patient declined  Copy of advanced directive(s) in chart? Yes  Pre-existing out of facility DNR order (yellow form or pink MOST form) -     Chief Complaint  Patient presents with  . Acute Visit    nausea    HPI:  Pt is a 82 y.o. female seen today for an acute visit forComplaints of nausea.  Patient otherwise appears to be in her usual state of health she continues to ambulate about the facility in her wheelchair-she is pleasant interactive somewhat confused apparently somewhat agitated more so at night with a history of dementia.  She is not really complaining of any vomiting or acute abdominal discomfort says she feels nauseous and a little weak.  She is a somewhat poor historian secondary to dementia.  She does have a history of hypertension systolic is mildly elevated at 150s on exam otherwise vital signs are unremarkable.  She does have a history of hypertension she is on labetalol 200 mg twice a day as well as Norvasc 5 mg a day.  She also has a history of renal insufficiency with hyperkalemia Kayexalate has kept this stable  however BUN of 42 creatinine 1.36 on lab done September 12 is relatively baseline.  Currently she is sitting in her wheelchair comfortably she does not usually complain however of issues  She feels a Coca-Cola  may help with the nausea    Past Medical History:  Diagnosis Date  . Anemia   . Anxiety   . Dementia   . Dysphagia, oropharyngeal phase   . GERD (gastroesophageal reflux disease)   . Hyperpotassemia   . Hyperpotassemia   . Hypertension   . Other severe protein-calorie malnutrition    Past Surgical History:  Procedure Laterality Date  . ABDOMINAL HYSTERECTOMY    . APPENDECTOMY      No Known Allergies  Current Outpatient Prescriptions on File Prior to Visit  Medication Sig Dispense Refill  . amLODipine (NORVASC) 5 MG tablet Take 5 mg by mouth daily. For HTN    . labetalol (NORMODYNE) 200 MG tablet Take 200 mg by mouth 2 (two) times daily. For HTN    . sodium polystyrene (KAYEXALATE) 15 GM/60ML suspension Take 15 g by mouth 2 (two) times a week. 2 times weekly on  Thursday and Sunday     No current facility-administered medications on file prior to visit.      Review of Systems   This is somewhat limited secondary to dementia.  In general does not complain of fever or chills.  Head ears eyes nose mouth and throat does not complain of visual changes or sore throat.  Respiratory denies shortness  breath or cough.  Cardiac denies chest pain.  GI does complain of nausea but does not complain of abdominal pain diarrhea constipation.  GU denies dysuria.  Musculoskeletal does not complain of joint pain says she just feels kind of weak.  Neurologic is not complaining of headache at times will complain of dizziness but does not complaining of overt dizziness today just more weakness.  Psych she does have a history of dementia but is doing quite well with supportive care this appears stable continues to be pleasant although somewhat confused.     Immunization  History  Administered Date(s) Administered  . Influenza-Unspecified 05/23/2014, 05/18/2016   Pertinent  Health Maintenance Due  Topic Date Due  . DEXA SCAN  02/10/2017 (Originally 04/30/1988)  . PNA vac Low Risk Adult (1 of 2 - PCV13) 02/10/2017 (Originally 04/30/1988)  . INFLUENZA VACCINE  Completed   No flowsheet data found. Functional Status Survey:    Vitals:   05/31/16 1341  BP: (!) 150/84  Pulse: 69  Resp: 20  Temp: 97.7 F (36.5 C)  TempSrc: Oral   There is no height or weight on file to calculate BMI. Physical Exam  In general this is a pleasant elderly female who does not appear to be in any distress sitting comfortably in her wheelchair but she appears concerned about her nausea  Her skin is warm and dry.  Eyes she has prescription lenses pupils appear reactive to light visual acuity appears grossly intact.  Oropharynx is clear mucous membranes moist she has numerous extractions.  Chest is clear to auscultation there is no labored breathing.  Heart is regular rate and rhythm without murmur gallop or rub she does not have significant lower extremity edema.  Abdomen is soft nontender with positive bowel sounds.  Muscle skeletal has general frailty but ambulates well in a wheelchair I do not note any deformities other than arthritic and she continues to have somewhat of a lump on her right upper arm question possible biceps abnormality but this does not bother her.  Neurologic is grossly intact her speech is clear no lateralizing findings. Strength appears preserved in all 4 extremities  Psych she is oriented to self pleasant and appropriate has dementia  A little anxious about her nausea   Labs reviewed:  Recent Labs  06/18/15 0730  02/12/16 0710 03/22/16 0545 04/27/16 0500  NA 139  < > 136 136 137  K 5.2*  < > 4.6 4.6 4.5  CL 111  < > 107 102 106  CO2 23  < > 25 24 26   GLUCOSE 92  < > 87 82 84  BUN 22*  < > 44* 35* 42*  CREATININE 1.07*   < > 1.37* 1.37* 1.36*  CALCIUM 9.1  < > 9.2 9.0 9.2  MG 1.8  --   --   --   --   < > = values in this interval not displayed. No results for input(s): AST, ALT, ALKPHOS, BILITOT, PROT, ALBUMIN in the last 8760 hours.  Recent Labs  12/19/15 0745 01/23/16 0700 04/27/16 0500  WBC 4.9 4.4 5.2  NEUTROABS 2.6 2.1 2.7  HGB 9.5* 9.1* 9.2*  HCT 29.1* 27.3* 27.6*  MCV 98.6 97.8 98.6  PLT 253 242 232   Lab Results  Component Value Date   TSH 2.567 01/23/2016   No results found for: HGBA1C No results found for: CHOL, HDL, LDLCALC, LDLDIRECT, TRIG, CHOLHDL  Significant Diagnostic Results in last 30 days:  No results found.  Assessment/Plan Nausea without vomiting-again patient does not really complain much at all so this is a change-she is asked to have some Tylenol and a Coke will give her this and see if that helps if not certainly will have to be more aggressive.  She does not show any neurologic signs no complaints of chest pain or dysuria-and no abdominal discomfort of significance either.  Addendum.  I did reevaluate patient later in the afternoon the nausea had resolved she was sitting in her room in her wheelchair comfortably no longer has any complaints-I did take her blood pressure was somewhat improved at 142/70-there is some variability per recent blood pressures at this point will order blood pressure checks daily to keep an eye on this Again she is on labetalol 200 mg twice a day Norvasc 5 mg a day if consistently elevated suspect we can go up on the Norvasc.  Nausea appears resolved after receiving the soft drink and the Tylenol at this point will monitor for any reoccurrence  [id number]      Oralia Manis, [place]

## 2016-06-23 ENCOUNTER — Encounter: Payer: Self-pay | Admitting: Internal Medicine

## 2016-06-23 ENCOUNTER — Non-Acute Institutional Stay (SKILLED_NURSING_FACILITY): Payer: Medicare Other | Admitting: Internal Medicine

## 2016-06-23 DIAGNOSIS — I10 Essential (primary) hypertension: Secondary | ICD-10-CM

## 2016-06-23 DIAGNOSIS — K219 Gastro-esophageal reflux disease without esophagitis: Secondary | ICD-10-CM | POA: Diagnosis not present

## 2016-06-23 DIAGNOSIS — N289 Disorder of kidney and ureter, unspecified: Secondary | ICD-10-CM

## 2016-06-23 DIAGNOSIS — F0391 Unspecified dementia with behavioral disturbance: Secondary | ICD-10-CM

## 2016-06-23 NOTE — Progress Notes (Signed)
Location:   [facility] Room Number: 109/W Place of Service:  SNF [phone]) Provider:  Freddi Starr, MD  Patient Care Team: [name], MD as PCP - General (Internal Medicine)  Extended Emergency Contact Information Primary Emergency Contact: High,[name] Address: [address]          [place], [place] [address] [place] Phone: [phone] Relation: Other Secondary Emergency Contact: [name], [place] [address] [name] of [place] Phone: [phone] Mobile Phone: [phone] Relation: Niece  Code Status:  Full Code Goals of care: Advanced Directive information Advanced Directives 06/23/2016  Does patient have an advance directive? Yes  Type of Advance Directive (No Data)  Does patient want to make changes to advanced directive? No - Patient declined  Copy of advanced directive(s) in chart? Yes  Pre-existing out of facility DNR order (yellow form or pink MOST form) -     Chief Complaint  Patient presents with  . Medical Management of Chronic Issues    Routine Visit   For medical management of chronic medical conditions including chronic renal insufficiency with hyperkalemia-hypertension-anemia-dementia-  HPI:  Pt is a 82 y.o. female seen today for medical management of chronic diseases. As noted above.   She continues to be quite stable-I did see her couple weeks ago for complaints of nausea apparently this was quite transitory however and appears she got a soft drink and Tylenol and it quickly resolved.  Today she has no acute complaints.  She does have a history of hypertension she is on Norvasc 5 mg a day as well as labetalol 200 mg twice a day-blood pressures appear to be stable recent blood pressures 134/76-110/78-102/60.  She also has a history of chronic kidney disease with hyperkalemia-she is on Kayexalate this appears to be stable last potassium was 4.5 on  04/27/2016-creatinine was 1.36 which is relatively baseline as well.  She continues to ambulate about the facility in a wheelchair largely is pleasant although somewhat confused-apparently there are periods of agitation at night         Past Medical History:  Diagnosis Date  . Anemia   . Anxiety   . Dementia   . Dysphagia, oropharyngeal phase   . GERD (gastroesophageal reflux disease)   . Hyperpotassemia   . Hyperpotassemia   . Hypertension   . Other severe protein-calorie malnutrition    Past Surgical History:  Procedure Laterality Date  . ABDOMINAL HYSTERECTOMY    . APPENDECTOMY      No Known Allergies  Current Outpatient Prescriptions on File Prior to Visit  Medication Sig Dispense Refill  . amLODipine (NORVASC) 5 MG tablet Take 5 mg by mouth daily. For HTN    . labetalol (NORMODYNE) 200 MG tablet Take 200 mg by mouth 2 (two) times daily. For HTN    . ranitidine (ZANTAC) 150 MG tablet Take 150 mg by mouth daily.    . sodium polystyrene (KAYEXALATE) 15 GM/60ML suspension Take 15 g by mouth 2 (two) times a week. 2 times weekly on  Thursday and Sunday     No current facility-administered medications on file prior to visit.     Review of Systems   This is somewhat limited secondary to dementia. Her weight appears to be stable  In general does not complain of fever or chills.  Head ears eyes nose mouth and throat does not complain of visual changes or sore throat.  Respiratory denies shortness breath or cough.  Cardiac denies chest pain.  GI Is not complaining of any nausea vomiting diarrhea constipation today no abdominal pain  GU denies dysuria.  Musculoskeletal does not complain of joint pain says she just feels kind of weak.  Neurologic is not complaining of headache at times will complain of dizziness but does not complaining of overt dizziness today just more weakness.  Psych she does have a history of dementia but is doing quite well with  supportive care this appears stable continues to be pleasant although somewhat confused.   Immunization History  Administered Date(s) Administered  . Influenza-Unspecified 05/23/2014, 05/18/2016  . Pneumococcal-Unspecified 05/25/2016   Pertinent  Health Maintenance Due  Topic Date Due  . DEXA SCAN  02/10/2017 (Originally 04/30/1988)  . PNA vac Low Risk Adult (2 of 2 - PCV13) 05/25/2017  . INFLUENZA VACCINE  Completed   No flowsheet data found. Functional Status Survey:    Vitals:   06/23/16 1212  BP: (!) 142/64  Pulse: 66  Resp: 18  Temp: 97 F (36.1 C)  TempSrc: Oral  No recent blood pressure 110/78-134/76-102/60 I do not see consistent elevations.  Her weight is stable at 113 pounds  Physical Exam   In general this is a pleasant elderly female who does not appear to be in any distress sitting comfortably in her wheelchair   Her skin is warm and dry.  Eyes she has prescription lenses pupils appear reactive to light visual acuity appears grossly intact.  Oropharynx is clear mucous membranes moist she has numerous extractions.  Chest is clear to auscultation there is no labored breathing.  Heart is regular rate and rhythm without murmur gallop or rub she does not have significant lower extremity edema.  Abdomen is soft nontender with positive bowel sounds.  Muscle skeletal has general frailty but ambulates well in a wheelchair I do not note any deformities other than arthritic and she continues to have somewhat of a lump on her right upper arm question possible biceps abnormality but this does not bother her. It is flesh-colored nontender  Neurologic is grossly intact her speech is clear no lateralizing findings. Strength appears preserved in all 4 extremities  Psych she is oriented to self pleasant and appropriate has dementia     Labs reviewed:  Recent Labs  02/12/16 0710 03/22/16 0545 04/27/16 0500  NA 136 136 137  K 4.6 4.6 4.5  CL 107 102  106  CO2 25 24 26   GLUCOSE 87 82 84  BUN 44* 35* 42*  CREATININE 1.37* 1.37* 1.36*  CALCIUM 9.2 9.0 9.2   No results for input(s): AST, ALT, ALKPHOS, BILITOT, PROT, ALBUMIN in the last 8760 hours.  Recent Labs  12/19/15 0745 01/23/16 0700 04/27/16 0500  WBC 4.9 4.4 5.2  NEUTROABS 2.6 2.1 2.7  HGB 9.5* 9.1* 9.2*  HCT 29.1* 27.3* 27.6*  MCV 98.6 97.8 98.6  PLT 253 242 232   Lab Results  Component Value Date   TSH 2.567 01/23/2016   No results found for: HGBA1C No results found for: CHOL, HDL, LDLCALC, LDLDIRECT, TRIG, CHOLHDL  Significant Diagnostic Results in last 30 days:  No results found.  Assessment/Plan  Essential hypertension Blood Pressure well controlled.. On Norvasc and labetolol occasional systolic spikes but these are not consistent  Renal insufficiency Creat stable at 1.36. Patient BUN stays mildly elevated with good appetite. We'll update BMP  Hyperkalemia Patient potassium stable on Kayexalate. Will update BMP D keep an eye on  this  GERD  This appears stable Pepcid recently was changed to Zantac but she appears to be tolerating this well c.  Anemia in stage 3 chronic kidney disease Hgb Stable at 9.2 Will update this for updated values she has had some significant anemia in the past but this has been stable for some time now Dementia Continue supportive care her weight continues to be stable  [id number].  d:

## 2016-06-24 ENCOUNTER — Encounter (HOSPITAL_COMMUNITY)
Admission: RE | Admit: 2016-06-24 | Discharge: 2016-06-24 | Disposition: A | Discharge status: Home / Self Care | Payer: Medicare Other | Source: Skilled Nursing Facility | Attending: Internal Medicine | Admitting: Internal Medicine

## 2016-06-24 DIAGNOSIS — Z9181 History of falling: Secondary | ICD-10-CM | POA: Insufficient documentation

## 2016-06-24 DIAGNOSIS — D649 Anemia, unspecified: Secondary | ICD-10-CM | POA: Insufficient documentation

## 2016-06-24 DIAGNOSIS — E43 Unspecified severe protein-calorie malnutrition: Secondary | ICD-10-CM | POA: Insufficient documentation

## 2016-06-24 DIAGNOSIS — D500 Iron deficiency anemia secondary to blood loss (chronic): Secondary | ICD-10-CM | POA: Diagnosis present

## 2016-06-24 DIAGNOSIS — F0390 Unspecified dementia without behavioral disturbance: Secondary | ICD-10-CM | POA: Insufficient documentation

## 2016-06-24 DIAGNOSIS — I10 Essential (primary) hypertension: Secondary | ICD-10-CM | POA: Diagnosis present

## 2016-06-24 DIAGNOSIS — R1312 Dysphagia, oropharyngeal phase: Secondary | ICD-10-CM | POA: Diagnosis not present

## 2016-06-24 LAB — BASIC METABOLIC PANEL
ANION GAP: 5 (ref 5–15)
BUN: 39 mg/dL — AB (ref 6–20)
CHLORIDE: 105 mmol/L (ref 101–111)
CO2: 24 mmol/L (ref 22–32)
Calcium: 9.3 mg/dL (ref 8.9–10.3)
Creatinine, Ser: 1.29 mg/dL — ABNORMAL HIGH (ref 0.44–1.00)
GFR, EST AFRICAN AMERICAN: 40 mL/min — AB (ref >60)
GFR, EST NON AFRICAN AMERICAN: 35 mL/min — AB (ref >60)
Glucose, Bld: 82 mg/dL (ref 65–99)
POTASSIUM: 5.2 mmol/L — AB (ref 3.5–5.1)
SODIUM: 134 mmol/L — AB (ref 135–145)

## 2016-06-24 LAB — CBC
HCT: 28 % — ABNORMAL LOW (ref 36.0–46.0)
HEMOGLOBIN: 8.9 g/dL — AB (ref 12.0–15.0)
MCH: 31.9 pg (ref 26.0–34.0)
MCHC: 31.8 g/dL (ref 30.0–36.0)
MCV: 100.4 fL — ABNORMAL HIGH (ref 78.0–100.0)
PLATELETS: 227 10*3/uL (ref 150–400)
RBC: 2.79 MIL/uL — AB (ref 3.87–5.11)
RDW: 12.4 % (ref 11.5–15.5)
WBC: 4.8 10*3/uL (ref 4.0–10.5)

## 2016-07-01 ENCOUNTER — Encounter (HOSPITAL_COMMUNITY)
Admission: RE | Admit: 2016-07-01 | Discharge: 2016-07-01 | Disposition: A | Discharge status: Home / Self Care | Payer: Medicare Other | Source: Skilled Nursing Facility | Attending: Internal Medicine | Admitting: Internal Medicine

## 2016-07-01 DIAGNOSIS — I10 Essential (primary) hypertension: Secondary | ICD-10-CM | POA: Diagnosis not present

## 2016-07-01 LAB — CBC WITH DIFFERENTIAL/PLATELET
BASOS ABS: 0 10*3/uL (ref 0.0–0.1)
Basophils Relative: 1 %
Eosinophils Absolute: 0.3 10*3/uL (ref 0.0–0.7)
Eosinophils Relative: 6 %
HEMATOCRIT: 27.2 % — AB (ref 36.0–46.0)
Hemoglobin: 9 g/dL — ABNORMAL LOW (ref 12.0–15.0)
LYMPHS PCT: 34 %
Lymphs Abs: 1.8 10*3/uL (ref 0.7–4.0)
MCH: 32.5 pg (ref 26.0–34.0)
MCHC: 33.1 g/dL (ref 30.0–36.0)
MCV: 98.2 fL (ref 78.0–100.0)
Monocytes Absolute: 0.5 10*3/uL (ref 0.1–1.0)
Monocytes Relative: 10 %
NEUTROS ABS: 2.6 10*3/uL (ref 1.7–7.7)
Neutrophils Relative %: 49 %
Platelets: 227 10*3/uL (ref 150–400)
RBC: 2.77 MIL/uL — AB (ref 3.87–5.11)
RDW: 13.1 % (ref 11.5–15.5)
WBC: 5.2 10*3/uL (ref 4.0–10.5)

## 2016-07-01 LAB — BASIC METABOLIC PANEL
ANION GAP: 5 (ref 5–15)
BUN: 38 mg/dL — ABNORMAL HIGH (ref 6–20)
CO2: 26 mmol/L (ref 22–32)
Calcium: 9.4 mg/dL (ref 8.9–10.3)
Chloride: 106 mmol/L (ref 101–111)
Creatinine, Ser: 1.36 mg/dL — ABNORMAL HIGH (ref 0.44–1.00)
GFR, EST AFRICAN AMERICAN: 38 mL/min — AB (ref >60)
GFR, EST NON AFRICAN AMERICAN: 32 mL/min — AB (ref >60)
Glucose, Bld: 88 mg/dL (ref 65–99)
Potassium: 4.4 mmol/L (ref 3.5–5.1)
Sodium: 137 mmol/L (ref 135–145)

## 2016-08-02 ENCOUNTER — Encounter: Payer: Self-pay | Admitting: Internal Medicine

## 2016-08-02 ENCOUNTER — Non-Acute Institutional Stay (SKILLED_NURSING_FACILITY): Payer: Medicare Other | Admitting: Internal Medicine

## 2016-08-02 DIAGNOSIS — N183 Chronic kidney disease, stage 3 unspecified: Secondary | ICD-10-CM

## 2016-08-02 DIAGNOSIS — N184 Chronic kidney disease, stage 4 (severe): Secondary | ICD-10-CM | POA: Insufficient documentation

## 2016-08-02 DIAGNOSIS — N189 Chronic kidney disease, unspecified: Secondary | ICD-10-CM

## 2016-08-02 DIAGNOSIS — I10 Essential (primary) hypertension: Secondary | ICD-10-CM | POA: Diagnosis not present

## 2016-08-02 DIAGNOSIS — E875 Hyperkalemia: Secondary | ICD-10-CM | POA: Diagnosis not present

## 2016-08-02 NOTE — Progress Notes (Signed)
Location:   [facility] Room Number: 109/W Place of Service:  SNF [phone]) Provider:  Clydene Fake, MD  Patient Care Team: [name], MD as PCP - General (Internal Medicine)  Extended Emergency Contact Information Primary Emergency Contact: [place],[name] Address: [address]          [place], [place] [address] [place] Phone: [phone] Relation: Other Secondary Emergency Contact: [name], [place] [address] [name] of [place] Phone: [phone] Mobile Phone: [phone] Relation: Niece  Code Status:  Full Code Goals of care: Advanced Directive information Advanced Directives 08/02/2016  Does Patient Have a Medical Advance Directive? Yes  Type of Advance Directive (No Data)  Does patient want to make changes to medical advance directive? No - Patient declined  Copy of [place] in Chart? -  Pre-existing out of facility DNR order (yellow form or pink MOST form) -     Chief Complaint  Patient presents with  . Medical Management of Chronic Issues    Routine Visit    HPI:  Pt is a 82 y.o. female seen today for medical management of chronic diseases.    Patient has h/o Hyperkalemia secondary to Chronic renal insufficiency, hypertension, anemia and Dementia. Without any overt Behavior issues.  Patient has been stable for past few months. She did have episode of Nausea which resolved itself. Her weight has been b/w 112-114 lbs and stable. Patient is very pleasant with mild dementia. She wheels around in her wheelchair.   Past Medical History:  Diagnosis Date  . Anemia   . Anxiety   . Dementia   . Dysphagia, oropharyngeal phase   . GERD (gastroesophageal reflux disease)   . Hyperpotassemia   . Hyperpotassemia   . Hypertension   . Other severe protein-calorie malnutrition    Past Surgical History:  Procedure Laterality Date  . ABDOMINAL HYSTERECTOMY    .  APPENDECTOMY      No Known Allergies  Current Outpatient Prescriptions on File Prior to Visit  Medication Sig Dispense Refill  . amLODipine (NORVASC) 5 MG tablet Take 5 mg by mouth daily. For HTN    . labetalol (NORMODYNE) 200 MG tablet Take 200 mg by mouth 2 (two) times daily. For HTN    . ranitidine (ZANTAC) 150 MG tablet Take 150 mg by mouth daily.    . sodium polystyrene (KAYEXALATE) 15 GM/60ML suspension Take 15 g by mouth once. Once a day on Sun,Tue,Thu     No current facility-administered medications on file prior to visit.      Review of Systems  Unable to perform ROS: Dementia    Immunization History  Administered Date(s) Administered  . Influenza-Unspecified 05/23/2014, 05/18/2016  . Pneumococcal-Unspecified 05/25/2016   Pertinent  Health Maintenance Due  Topic Date Due  . DEXA SCAN  02/10/2017 (Originally 04/30/1988)  . PNA vac Low Risk Adult (2 of 2 - PCV13) 05/25/2017  . INFLUENZA VACCINE  Completed   No flowsheet data found. Functional Status Survey:    Vitals:   08/02/16 1341  BP: 118/72  Pulse: 67  Resp: 18  Temp: 98.2 F (36.8 C)  TempSrc: Oral  SpO2: 97%   There is no height or weight on file to calculate BMI. Physical Exam  Constitutional: She appears well-developed and well-nourished.  HENT:  Head: Normocephalic.  Mouth/Throat: Oropharynx is clear and moist.  Eyes: Pupils are equal, round, and  reactive to light.  Neck: Neck supple.  Cardiovascular: Normal rate, regular rhythm and normal heart sounds.   Pulmonary/Chest: Effort normal and breath sounds normal. No respiratory distress. She has no wheezes. She has no rales.  Abdominal: Soft. Bowel sounds are normal. She exhibits no distension. There is no tenderness. There is no rebound.  Lymphadenopathy:    She has no cervical adenopathy.  Neurological: She is alert.  Not oriented to time place or person. Moves all her extremities.    Labs reviewed:  Recent Labs  04/27/16 0500  06/24/16 0500 07/01/16 0500  NA 137 134* 137  K 4.5 5.2* 4.4  CL 106 105 106  CO2 26 24 26   GLUCOSE 84 82 88  BUN 42* 39* 38*  CREATININE 1.36* 1.29* 1.36*  CALCIUM 9.2 9.3 9.4   No results for input(s): AST, ALT, ALKPHOS, BILITOT, PROT, ALBUMIN in the last 8760 hours.  Recent Labs  01/23/16 0700 04/27/16 0500 06/24/16 0500 07/01/16 0500  WBC 4.4 5.2 4.8 5.2  NEUTROABS 2.1 2.7  --  2.6  HGB 9.1* 9.2* 8.9* 9.0*  HCT 27.3* 27.6* 28.0* 27.2*  MCV 97.8 98.6 100.4* 98.2  PLT 242 232 227 227   Lab Results  Component Value Date   TSH 2.567 01/23/2016   No results found for: HGBA1C No results found for: CHOL, HDL, LDLCALC, LDLDIRECT, TRIG, CHOLHDL  Significant Diagnostic Results in last 30 days:  No results found.  Assessment/Plan Essential hypertension BP well controlled on Labetalol and Norvasc.  Hyperkalemia Potassium stable on Kayexalate. Continue same dose. Last potassium was 4.4  Stage 3 chronic kidney disease Creat Stays stable Anemia Work up was negative in 06/17 Thought to be due to Chronic renal disease. Dementia Patient stable with no behavior Issues.     Family/ staff Communication:   Labs/tests ordered:

## 2016-08-24 ENCOUNTER — Non-Acute Institutional Stay (SKILLED_NURSING_FACILITY): Payer: Medicare Other | Admitting: Internal Medicine

## 2016-08-24 ENCOUNTER — Encounter: Payer: Self-pay | Admitting: Internal Medicine

## 2016-08-24 DIAGNOSIS — D631 Anemia in chronic kidney disease: Secondary | ICD-10-CM | POA: Diagnosis not present

## 2016-08-24 DIAGNOSIS — N183 Chronic kidney disease, stage 3 unspecified: Secondary | ICD-10-CM

## 2016-08-24 DIAGNOSIS — K219 Gastro-esophageal reflux disease without esophagitis: Secondary | ICD-10-CM | POA: Diagnosis not present

## 2016-08-24 DIAGNOSIS — F0391 Unspecified dementia with behavioral disturbance: Secondary | ICD-10-CM

## 2016-08-24 DIAGNOSIS — I10 Essential (primary) hypertension: Secondary | ICD-10-CM | POA: Diagnosis not present

## 2016-08-24 NOTE — Progress Notes (Signed)
Location:   [facility] Room Number: 109/W Place of Service:  SNF [phone]) Provider:  Freddi Starr, MD  Patient Care Team: [name], MD as PCP - General (Internal Medicine)  Extended Emergency Contact Information Primary Emergency Contact: High,[name] Address: [address]          [place], [place] [address] [place] Phone: [phone] Relation: Other Secondary Emergency Contact: [name], [place] [address] [name] of [place] Phone: [phone] Mobile Phone: [phone] Relation: Niece  Code Status:  Full Code Goals of care: Advanced Directive information Advanced Directives 08/24/2016  Does Patient Have a Medical Advance Directive? Yes  Type of Advance Directive (No Data)  Does patient want to make changes to medical advance directive? No - Patient declined  Copy of [place] in Chart? -  Pre-existing out of facility DNR order (yellow form or pink MOST form) -     Chief Complaint  Patient presents with  . Medical Management of Chronic Issues    Routine Visit  Medical management of chronic medical issues including hyperkalemia and chronic renal insufficiency-hypertension-dementia-anemia.    HPI:  Pt is a 83 y.o. female seen today for medical management of chronic diseases.  She continues to be quite stable her weight is stable at 113-114 pounds.  She does have a history of renal insufficiency and hyperkalemia she is on Kayexalate and this has been stable-on November 16 potassium was 4.4 creatinine was 1.36 which is relatively baseline with a BUN of 38.  She does have a history of dementia currently on no medication nonetheless this appears stable she's doing well with supportive care ambulates about the facility in her wheelchair and pleasantly confused.  She has a history of hypertension she is on labetalol 200 mg twice a day as well as Norvasc 5 mg a day most  recent blood pressure appears to be stable at 125/79 and this is been stable as well for some time it appears.  Currently she has no acute complaints she is sitting comfortably in her  wheelchair which is her normal presentation     Past Medical History:  Diagnosis Date  . Anemia   . Anxiety   . Dementia   . Dysphagia, oropharyngeal phase   . GERD (gastroesophageal reflux disease)   . Hyperpotassemia   . Hyperpotassemia   . Hypertension   . Other severe protein-calorie malnutrition    Past Surgical History:  Procedure Laterality Date  . ABDOMINAL HYSTERECTOMY    . APPENDECTOMY      No Known Allergies  Current Outpatient Prescriptions on File Prior to Visit  Medication Sig Dispense Refill  . amLODipine (NORVASC) 5 MG tablet Take 5 mg by mouth daily. For HTN    . labetalol (NORMODYNE) 200 MG tablet Take 200 mg by mouth 2 (two) times daily. For HTN    . ranitidine (ZANTAC) 150 MG tablet Take 150 mg by mouth daily.    . sodium polystyrene (KAYEXALATE) 15 GM/60ML suspension Take 15 g by mouth once. Once a day on Sun,Tue,Thu     No current facility-administered medications on file prior to visit.      Review of Systems    This is somewhat limited secondary to dementia. Her weight appears to be stable  In general does not complain of fever or chills.  Head ears eyes nose mouth and throat does not complain of  visual changes or sore throat.  Respiratory denies shortness breath or cough.  Cardiac denies chest pain.  GI Is not complaining of any nausea vomiting diarrhea constipation today no abdominal pain  GU denies dysuria.  Musculoskeletal does not complain of joint pain  Neurologic is not complaining of headache  Or dizziness-has complained of dizziness in the past  Psych she does have a history of dementia but is doing quite well with supportive care this appears stable continues to be pleasant although somewhat confused.   Immunization History    Administered Date(s) Administered  . Influenza-Unspecified 05/23/2014, 05/18/2016  . Pneumococcal-Unspecified 05/25/2016   Pertinent  Health Maintenance Due  Topic Date Due  . DEXA SCAN  02/10/2017 (Originally [birth year]30/1988)  . PNA vac Low Risk Adult (2 of 2 - PCV13) 05/25/2017  . INFLUENZA VACCINE  Completed   No flowsheet data found. Functional Status Survey:     Temperature 97.4 pulse 64 respirations 20 blood pressure 125/79 weight is stable at at 113.6 Physical Exam  In general this is a pleasant elderly female who does not appear to be in any distress sitting comfortably in her wheelchair   Her skin is warm and dry.  Eyes she has prescription lenses pupils appear reactive to light visual acuity appears grossly intact.  Oropharynx is clear mucous membranes moist she has numerous extractions.  Chest is clear to auscultation there is no labored breathing.  Heart is regular rate and rhythm without murmur gallop or rub she does not have significant lower extremity edema.  Abdomen is soft nontender with positive bowel sounds.  Muscle skeletal has general frailty but ambulates well in a wheelchair I do not note any deformities other than arthritic and she continues to have somewhat of a lump on her right upper arm question possible biceps abnormality but this does not bother her. It is flesh-colored nontender  Neurologic is grossly intact her speech is clear no lateralizing findings.Strength appears preserved in all 4 extremities continues to ambulate without difficulty in her wheelchair about the entire facility  Psych she is oriented to self pleasant and appropriate continues to be smiling and in good humor    Labs reviewed:  Recent Labs  04/27/16 0500 06/24/16 0500 07/01/16 0500  NA 137 134* 137  K 4.5 5.2* 4.4  CL 106 105 106  CO2 26 24 26   GLUCOSE 84 82 88  BUN 42* 39* 38*  CREATININE 1.36* 1.29* 1.36*  CALCIUM 9.2 9.3 9.4   No results for input(s):  AST, ALT, ALKPHOS, BILITOT, PROT, ALBUMIN in the last 8760 hours.  Recent Labs  01/23/16 0700 04/27/16 0500 06/24/16 0500 07/01/16 0500  WBC 4.4 5.2 4.8 5.2  NEUTROABS 2.1 2.7  --  2.6  HGB 9.1* 9.2* 8.9* 9.0*  HCT 27.3* 27.6* 28.0* 27.2*  MCV 97.8 98.6 100.4* 98.2  PLT 242 232 227 227   Lab Results  Component Value Date   TSH 2.567 01/23/2016   No results found for: HGBA1C No results found for: CHOL, HDL, LDLCALC, LDLDIRECT, TRIG, CHOLHDL  Significant Diagnostic Results in last 30 days:  No results found.  Assessment/Plan  :  Dementia-she appears to be doing well with supportive care weight is stable continues to ambulate about facility in her wheelchair by enlarge is pleasant and cooperative apparently at times does have some agitation at night at this point will monitor.  #2 hypertension this appears stable on Norvasc and labetalol.  #3 history of hyperkalemia this appears stable on Kayexalate will  update a potassium level a metabolic panel to ensure stability.  #4 renal insufficiency this appears to be stable with a creatinine of 1.36.  We will update this as well.  #5 history of GERD this is stable and relatively asymptomatic on Zantac.  #6 history of anemia with stage III chronic kidney disease-hemoglobin--was relatively stable at 9.0 on lab done in mid November 2017 will update this as well.  [id number]  .

## 2016-08-25 ENCOUNTER — Encounter (HOSPITAL_COMMUNITY)
Admission: RE | Admit: 2016-08-25 | Discharge: 2016-08-25 | Disposition: A | Discharge status: Home / Self Care | Payer: Medicare Other | Source: Skilled Nursing Facility | Attending: Internal Medicine | Admitting: Internal Medicine

## 2016-08-25 DIAGNOSIS — D649 Anemia, unspecified: Secondary | ICD-10-CM | POA: Diagnosis present

## 2016-08-25 DIAGNOSIS — F0390 Unspecified dementia without behavioral disturbance: Secondary | ICD-10-CM | POA: Diagnosis present

## 2016-08-25 DIAGNOSIS — R1312 Dysphagia, oropharyngeal phase: Secondary | ICD-10-CM | POA: Diagnosis present

## 2016-08-25 DIAGNOSIS — Z9181 History of falling: Secondary | ICD-10-CM | POA: Diagnosis present

## 2016-08-25 DIAGNOSIS — E43 Unspecified severe protein-calorie malnutrition: Secondary | ICD-10-CM | POA: Insufficient documentation

## 2016-08-25 DIAGNOSIS — I10 Essential (primary) hypertension: Secondary | ICD-10-CM | POA: Insufficient documentation

## 2016-08-25 LAB — CBC WITH DIFFERENTIAL/PLATELET
Basophils Absolute: 0 10*3/uL (ref 0.0–0.1)
Basophils Relative: 1 %
Eosinophils Absolute: 0.3 10*3/uL (ref 0.0–0.7)
Eosinophils Relative: 5 %
HCT: 27.7 % — ABNORMAL LOW (ref 36.0–46.0)
HEMOGLOBIN: 9.2 g/dL — AB (ref 12.0–15.0)
LYMPHS PCT: 32 %
Lymphs Abs: 1.6 10*3/uL (ref 0.7–4.0)
MCH: 32.6 pg (ref 26.0–34.0)
MCHC: 33.2 g/dL (ref 30.0–36.0)
MCV: 98.2 fL (ref 78.0–100.0)
Monocytes Absolute: 0.6 10*3/uL (ref 0.1–1.0)
Monocytes Relative: 11 %
NEUTROS ABS: 2.6 10*3/uL (ref 1.7–7.7)
Neutrophils Relative %: 51 %
Platelets: 246 10*3/uL (ref 150–400)
RBC: 2.82 MIL/uL — ABNORMAL LOW (ref 3.87–5.11)
RDW: 13.4 % (ref 11.5–15.5)
WBC: 5 10*3/uL (ref 4.0–10.5)

## 2016-08-25 LAB — BASIC METABOLIC PANEL
Anion gap: 6 (ref 5–15)
BUN: 33 mg/dL — ABNORMAL HIGH (ref 6–20)
CALCIUM: 9 mg/dL (ref 8.9–10.3)
CHLORIDE: 106 mmol/L (ref 101–111)
CO2: 25 mmol/L (ref 22–32)
Creatinine, Ser: 1.31 mg/dL — ABNORMAL HIGH (ref 0.44–1.00)
GFR, EST AFRICAN AMERICAN: 39 mL/min — AB (ref >60)
GFR, EST NON AFRICAN AMERICAN: 34 mL/min — AB (ref >60)
Glucose, Bld: 87 mg/dL (ref 65–99)
Potassium: 4 mmol/L (ref 3.5–5.1)
SODIUM: 137 mmol/L (ref 135–145)

## 2016-10-04 ENCOUNTER — Encounter: Payer: Self-pay | Admitting: Internal Medicine

## 2016-10-04 ENCOUNTER — Non-Acute Institutional Stay (SKILLED_NURSING_FACILITY): Payer: Medicare Other | Admitting: Internal Medicine

## 2016-10-04 DIAGNOSIS — I10 Essential (primary) hypertension: Secondary | ICD-10-CM | POA: Diagnosis not present

## 2016-10-04 DIAGNOSIS — K219 Gastro-esophageal reflux disease without esophagitis: Secondary | ICD-10-CM

## 2016-10-04 DIAGNOSIS — N183 Chronic kidney disease, stage 3 unspecified: Secondary | ICD-10-CM

## 2016-10-04 DIAGNOSIS — E875 Hyperkalemia: Secondary | ICD-10-CM

## 2016-10-04 DIAGNOSIS — F0391 Unspecified dementia with behavioral disturbance: Secondary | ICD-10-CM | POA: Diagnosis not present

## 2016-10-04 NOTE — Progress Notes (Signed)
Location:   [facility] Room Number: 109/W Place of Service:  SNF [phone]) Provider:  Clydene Fake, MD  Patient Care Team: [name], MD as PCP - General (Internal Medicine)  Extended Emergency Contact Information Primary Emergency Contact: High,[name] Address: [address]          [place], [place] [address] [place] of [place] Phone: [phone] Relation: Other Secondary Emergency Contact: [name], [place] [address] [name] of [place] Phone: [phone] Mobile Phone: [phone] Relation: Niece  Code Status:  Full Code Goals of care: Advanced Directive information Advanced Directives 10/04/2016  Does Patient Have a Medical Advance Directive? Yes  Type of Advance Directive (No Data)  Does patient want to make changes to medical advance directive? No - Patient declined  Copy of [place] in Chart? -  Pre-existing out of facility DNR order (yellow form or pink MOST form) -     Chief Complaint  Patient presents with  . Medical Management of Chronic Issues    Routine Visit    HPI:  Pt is a 83 y.o. female seen today for medical management of chronic diseases.      Patient has h/o Hyperkalemia secondary to Chronic renal insufficiency, hypertension, anemia secondary  And dementia without any behavior issues.   Patient has been clinically stable. She did not have any new Complains. Patient is very Pleasant and moves around in the facility with Wheelchair. Her weight is stable at 113.8 lbs.According to Nurses her appetite is good. Past Medical History:  Diagnosis Date  . Anemia   . Anxiety   . Dementia   . Dysphagia, oropharyngeal phase   . GERD (gastroesophageal reflux disease)   . Hyperpotassemia   . Hyperpotassemia   . Hypertension   . Other severe protein-calorie malnutrition    Past Surgical History:  Procedure Laterality Date  . ABDOMINAL HYSTERECTOMY    .  APPENDECTOMY      No Known Allergies  Current Outpatient Prescriptions on File Prior to Visit  Medication Sig Dispense Refill  . amLODipine (NORVASC) 5 MG tablet Take 5 mg by mouth daily. For HTN    . labetalol (NORMODYNE) 200 MG tablet Take 200 mg by mouth 2 (two) times daily. For HTN    . ranitidine (ZANTAC) 150 MG tablet Take 150 mg by mouth daily.    . sodium polystyrene (KAYEXALATE) 15 GM/60ML suspension Take 15 g by mouth once. Once a day on Sun,Tue,Thu     No current facility-administered medications on file prior to visit.      Review of Systems Not very Reliable due to dementia Review of Systems  Constitutional: Negative for activity change, appetite change, chills, diaphoresis, fatigue and fever.  HENT: Negative for mouth sores, postnasal drip, rhinorrhea, sinus pain and sore throat.   Respiratory: Negative for apnea, cough, chest tightness, shortness of breath and wheezing.   Cardiovascular: Negative for chest pain, palpitations and leg swelling.  Gastrointestinal: Negative for abdominal distention, abdominal pain, constipation, diarrhea, nausea and vomiting.  Genitourinary: Negative for dysuria and frequency.  Musculoskeletal: Negative for arthralgias, joint swelling and myalgias.  Skin: Negative for rash.  Neurological: Negative for dizziness, syncope, weakness, light-headedness and numbness.  Psychiatric/Behavioral: Negative for behavioral problems, confusion and sleep disturbance.     Immunization History  Administered Date(s) Administered  . Influenza-Unspecified 05/23/2014, 05/18/2016  . Pneumococcal-Unspecified 05/25/2016   Pertinent  Health Maintenance Due  Topic Date Due  . DEXA SCAN  02/10/2017 (Originally 04[birth year]30/1988)  . PNA vac Low Risk Adult (2 of 2 - PCV13) 05/25/2017  . INFLUENZA VACCINE  Completed   No flowsheet data found. Functional Status Survey:    Vitals:   10/04/16 0957  BP: 130/77  Pulse: 68  Resp: 16  Temp: 98.7 F (37.1 C)    TempSrc: Oral  SpO2: 96%   There is no height or weight on file to calculate BMI. Physical Exam  Constitutional: She appears well-developed.  HENT:  Head: Normocephalic.  Mouth/Throat: Oropharynx is clear and moist.  Eyes: Pupils are equal, round, and reactive to light.  Neck: Neck supple.  Cardiovascular: Normal rate, regular rhythm and normal heart sounds.   Pulmonary/Chest: Effort normal and breath sounds normal.  Abdominal: Soft. Bowel sounds are normal. She exhibits no distension. There is no tenderness. There is no rebound.  Musculoskeletal: She exhibits edema.  Lymphadenopathy:    She has no cervical adenopathy.  Neurological: She is alert.  Thought she is in [place] and year was 1946  Skin: Skin is warm and dry.  Psychiatric: She has a normal mood and affect. Her behavior is normal. Thought content normal.    Labs reviewed:  Recent Labs  06/24/16 0500 07/01/16 0500 08/25/16 0500  NA 134* 137 137  K 5.2* 4.4 4.0  CL 105 106 106  CO2 24 26 25   GLUCOSE 82 88 87  BUN 39* 38* 33*  CREATININE 1.29* 1.36* 1.31*  CALCIUM 9.3 9.4 9.0   No results for input(s): AST, ALT, ALKPHOS, BILITOT, PROT, ALBUMIN in the last 8760 hours.  Recent Labs  04/27/16 0500 06/24/16 0500 07/01/16 0500 08/25/16 0500  WBC 5.2 4.8 5.2 5.0  NEUTROABS 2.7  --  2.6 2.6  HGB 9.2* 8.9* 9.0* 9.2*  HCT 27.6* 28.0* 27.2* 27.7*  MCV 98.6 100.4* 98.2 98.2  PLT 232 227 227 246   Lab Results  Component Value Date   TSH 2.567 01/23/2016   No results found for: HGBA1C No results found for: CHOL, HDL, LDLCALC, LDLDIRECT, TRIG, CHOLHDL  Significant Diagnostic Results in last 30 days:  No results found.  Assessment/Plan Essential hypertension Stable on Labetalol and Norvasc  Gastroesophageal reflux disease  On Zantac and doing well   Dementia  Doing well. No Behavior issues. Do not think Aricept will help at her age   Stage 3 chronic kidney disease Creat Stable    Hyperkalemia Continue Kayexalate  Anemia Due to Renal Insufficiency. HGB stable Labs reviewed from 01/18  Family/ staff Communication:   Labs/tests ordered:

## 2016-11-22 ENCOUNTER — Non-Acute Institutional Stay (SKILLED_NURSING_FACILITY): Payer: Medicare Other | Admitting: Internal Medicine

## 2016-11-22 ENCOUNTER — Encounter: Payer: Self-pay | Admitting: Internal Medicine

## 2016-11-22 DIAGNOSIS — N183 Chronic kidney disease, stage 3 unspecified: Secondary | ICD-10-CM

## 2016-11-22 DIAGNOSIS — I10 Essential (primary) hypertension: Secondary | ICD-10-CM | POA: Diagnosis not present

## 2016-11-22 DIAGNOSIS — D631 Anemia in chronic kidney disease: Secondary | ICD-10-CM

## 2016-11-22 DIAGNOSIS — E875 Hyperkalemia: Secondary | ICD-10-CM

## 2016-11-22 DIAGNOSIS — F0391 Unspecified dementia with behavioral disturbance: Secondary | ICD-10-CM | POA: Diagnosis not present

## 2016-11-22 DIAGNOSIS — K219 Gastro-esophageal reflux disease without esophagitis: Secondary | ICD-10-CM

## 2016-11-22 NOTE — Progress Notes (Signed)
Location:    [facility] Room Number: 109/W Place of Service:  SNF [phone]) Provider:  Freddi Starr, MD  Patient Care Team: [name], MD as PCP - General (Internal Medicine)  Extended Emergency Contact Information Primary Emergency Contact: High,[name] Address: [address]          [place], [place] [address] [place] of [place] Phone: [phone] Relation: Other Secondary Emergency Contact: [name], [place] [address] [name] of [place] Phone: [phone] Mobile Phone: [phone] Relation: Niece  Code Status:  Full Code Goals of care: Advanced Directive information Advanced Directives 11/22/2016  Does Patient Have a Medical Advance Directive? Yes  Type of Advance Directive (No Data)  Does patient want to make changes to medical advance directive? No - Patient declined  Copy of [place] in Chart? -  Pre-existing out of facility DNR order (yellow form or pink MOST form) -     Chief Complaint  Patient presents with  . Medical Management of Chronic Issues    Routine Visit  For medical management of dementia-hypertension-hyperkalemia with history of chronic kidney disease-GERD-  HPI:  Pt is a 83 y.o. female seen today for medical management of chronic diseases. As noted above she continues to have a period of stability-.  She continues to have a late about facility in her wheelchair continues to be in good spirits apparently at times does have some agitation at night but this appears to be fairly well controlled.  Her weight appears stable at 113 pounds.  She does have a history of chronic kidney disease with hyperkalemia and is on Kayexalate and this has been stable as well for an extended period of time most recent potassium was 4.0 creatinine was 1.31 which appears relatively stable.  She does have a history of hypertension on labetalol as well as Norvasc a listed blood  pressure is 155/71 however I took it manually and got 130/80 per nursing is generally pretty well controlled at this point will need to be monitored.  Currently she is sitting in her wheelchair comfortably has just gotten back from [place] appears to be in good spirits which is her baseline    Past Medical History:  Diagnosis Date  . Anemia   . Anxiety   . Dementia   . Dysphagia, oropharyngeal phase   . GERD (gastroesophageal reflux disease)   . Hyperpotassemia   . Hyperpotassemia   . Hypertension   . Other severe protein-calorie malnutrition    Past Surgical History:  Procedure Laterality Date  . ABDOMINAL HYSTERECTOMY    . APPENDECTOMY      No Known Allergies  Current Outpatient Prescriptions on File Prior to Visit  Medication Sig Dispense Refill  . amLODipine (NORVASC) 5 MG tablet Take 5 mg by mouth daily. For HTN    . labetalol (NORMODYNE) 200 MG tablet Take 200 mg by mouth 2 (two) times daily. For HTN    . ranitidine (ZANTAC) 150 MG tablet Take 150 mg by mouth daily.    . sodium polystyrene (KAYEXALATE) 15 GM/60ML suspension Take 15 g by mouth once. Once a day on Sun,Tue,Thu     No current facility-administered medications on file prior to visit.     Review of Systems This is somewhat limited secondary to dementia.Her weight appears to be stable  In general does not complain of fever or chills.  Head ears eyes nose mouth  and throat does not complain of visual changes or sore throat.-Has prescription lenses and enjoys reading the newspaper  Respiratory denies shortness breath or cough.  Cardiac denies chest pain.  GI Is not complaining of any nausea vomiting diarrhea constipation today no abdominal pain  GU denies dysuria.  Musculoskeletal does not complain of joint pain  Neurologic is not complaining of headache  Or dizziness-has complained of dizziness in the but has not for some time  Psych she does have a history of dementia but is doing quite  well with supportive care this appears stable continues to be pleasant although somewhat confused.  Immunization History  Administered Date(s) Administered  . Influenza-Unspecified 05/23/2014, 05/18/2016  . Pneumococcal-Unspecified 05/25/2016   Pertinent  Health Maintenance Due  Topic Date Due  . DEXA SCAN  02/10/2017 (Originally 04/30/1988)  . INFLUENZA VACCINE  03/16/2017  . PNA vac Low Risk Adult (2 of 2 - PCV13) 05/25/2017   No flowsheet data found. Functional Status Survey:    Vitals:   11/22/16 1607  BP: (!) 112/52  Pulse: 66  Resp: 18  Temp: 97.3 F (36.3 C)  TempSrc: Oral  SpO2: 97%  Of note female blood pressure was 130/80 this afternoon weight is stable at 112.8  Physical Exam In general this is a pleasant elderly female who does not appear to be in any distress sitting comfortably in her wheelchair   Her skin is warm and dry.  Eyes she has prescription lenses pupils appear reactive to light visual acuity appears grossly intact.  Oropharynx is clear mucous membranes moist she has numerous extractions.  Chest is clear to auscultation there is no labored breathing.  Heart is regular rate and rhythm with an occasional infrequent irregular beat-- without murmur gallop or rub she does not have significant lower extremity edema.  Abdomen is soft nontender with positive bowel sounds.  Muscle skeletal has general frailty but ambulates well in a wheelchair I do not note any deformities other than arthritic and she continues to have somewhat of a lump on her right upper arm question possible biceps abnormality but this does not bother her.  Neurologic is grossly intact her speech is clear no lateralizing findings.Strength appears preserved in all 4 extremities continues to ambulate without difficulty in her wheelchair about the entire facility  Psych she is oriented to self pleasant and appropriate continues to be smiling and in good humor --started reading the  paper after I left the room  Labs reviewed:  Recent Labs  06/24/16 0500 07/01/16 0500 08/25/16 0500  NA 134* 137 137  K 5.2* 4.4 4.0  CL 105 106 106  CO2 24 26 25   GLUCOSE 82 88 87  BUN 39* 38* 33*  CREATININE 1.29* 1.36* 1.31*  CALCIUM 9.3 9.4 9.0   No results for input(s): AST, ALT, ALKPHOS, BILITOT, PROT, ALBUMIN in the last 8760 hours.  Recent Labs  04/27/16 0500 06/24/16 0500 07/01/16 0500 08/25/16 0500  WBC 5.2 4.8 5.2 5.0  NEUTROABS 2.7  --  2.6 2.6  HGB 9.2* 8.9* 9.0* 9.2*  HCT 27.6* 28.0* 27.2* 27.7*  MCV 98.6 100.4* 98.2 98.2  PLT 232 227 227 246   Lab Results  Component Value Date   TSH 2.567 01/23/2016   No results found for: HGBA1C No results found for: CHOL, HDL, LDLCALC, LDLDIRECT, TRIG, CHOLHDL  Significant Diagnostic Results in last 30 days:  No results found.  Assessment/Plan  #1 history of dementia she's doing well with supportive care for her  weight has been stable apparently she eats pretty well apparently has some agitation later in the day but I think this has gotten better nursing staff does not really mention this much anymore.  #2 history of chronic kidney disease this appears stable with a creatinine most recently 1.31-she continues on Kayexalate with history of coexistent hyperkalemia-will update a BMP.  #3 hypertension this appears stable as noted above on labetalol as well as Norvasc.  #4 GERD this continues to be stable on Zantac.  #5 history of anemia with history of renal insufficiency this is been stable recently with a hemoglobin 9.2 it has been significantly lower previously will update a CBC.  [id number]

## 2016-11-23 ENCOUNTER — Encounter (HOSPITAL_COMMUNITY)
Admission: AD | Admit: 2016-11-23 | Discharge: 2016-11-23 | Disposition: A | Discharge status: Home / Self Care | Payer: Medicare Other | Source: Skilled Nursing Facility | Attending: Internal Medicine | Admitting: Internal Medicine

## 2016-11-23 DIAGNOSIS — I10 Essential (primary) hypertension: Secondary | ICD-10-CM | POA: Insufficient documentation

## 2016-11-23 DIAGNOSIS — Z9181 History of falling: Secondary | ICD-10-CM | POA: Insufficient documentation

## 2016-11-23 DIAGNOSIS — E43 Unspecified severe protein-calorie malnutrition: Secondary | ICD-10-CM | POA: Insufficient documentation

## 2016-11-23 DIAGNOSIS — R1312 Dysphagia, oropharyngeal phase: Secondary | ICD-10-CM | POA: Insufficient documentation

## 2016-11-23 DIAGNOSIS — F0390 Unspecified dementia without behavioral disturbance: Secondary | ICD-10-CM | POA: Insufficient documentation

## 2016-11-23 DIAGNOSIS — D649 Anemia, unspecified: Secondary | ICD-10-CM | POA: Diagnosis present

## 2016-11-23 LAB — CBC
HEMATOCRIT: 27.6 % — AB (ref 36.0–46.0)
Hemoglobin: 9.3 g/dL — ABNORMAL LOW (ref 12.0–15.0)
MCH: 33 pg (ref 26.0–34.0)
MCHC: 33.7 g/dL (ref 30.0–36.0)
MCV: 97.9 fL (ref 78.0–100.0)
Platelets: 216 10*3/uL (ref 150–400)
RBC: 2.82 MIL/uL — ABNORMAL LOW (ref 3.87–5.11)
RDW: 13.2 % (ref 11.5–15.5)
WBC: 4.8 10*3/uL (ref 4.0–10.5)

## 2016-11-23 LAB — BASIC METABOLIC PANEL
Anion gap: 8 (ref 5–15)
BUN: 39 mg/dL — ABNORMAL HIGH (ref 6–20)
CALCIUM: 9.2 mg/dL (ref 8.9–10.3)
CO2: 25 mmol/L (ref 22–32)
CREATININE: 1.36 mg/dL — AB (ref 0.44–1.00)
Chloride: 104 mmol/L (ref 101–111)
GFR calc non Af Amer: 32 mL/min — ABNORMAL LOW (ref >60)
GFR, EST AFRICAN AMERICAN: 38 mL/min — AB (ref >60)
GLUCOSE: 87 mg/dL (ref 65–99)
Potassium: 4.6 mmol/L (ref 3.5–5.1)
Sodium: 137 mmol/L (ref 135–145)

## 2016-12-31 NOTE — Progress Notes (Signed)
This encounter was created in error - please disregard.

## 2017-01-04 ENCOUNTER — Encounter: Payer: Self-pay | Admitting: Internal Medicine

## 2017-01-04 ENCOUNTER — Non-Acute Institutional Stay (SKILLED_NURSING_FACILITY): Payer: Medicare Other | Admitting: Internal Medicine

## 2017-01-04 DIAGNOSIS — N183 Chronic kidney disease, stage 3 unspecified: Secondary | ICD-10-CM

## 2017-01-04 DIAGNOSIS — D631 Anemia in chronic kidney disease: Secondary | ICD-10-CM

## 2017-01-04 DIAGNOSIS — I10 Essential (primary) hypertension: Secondary | ICD-10-CM

## 2017-01-04 DIAGNOSIS — E875 Hyperkalemia: Secondary | ICD-10-CM

## 2017-01-04 NOTE — Progress Notes (Signed)
Location:   [facility] Room Number: 109/W Place of Service:  SNF [phone]) Provider:  Kyung Rudd, Rene Kocher, MD  Patient Care Team: [name], MD as PCP - General (Internal Medicine)  Extended Emergency Contact Information Primary Emergency Contact: [place],[name] Address: [address]          [place], [place] [address] [place] Phone: [phone] Relation: Other Secondary Emergency Contact: [name], [place] [address] [name] of [place] Phone: [phone] Mobile Phone: [phone] Relation: Niece  Code Status:  Full Code Goals of care: Advanced Directive information Advanced Directives 01/04/2017  Does Patient Have a Medical Advance Directive? Yes  Type of Advance Directive (No Data)  Does patient want to make changes to medical advance directive? No - Patient declined  Copy of [place] in Chart? -  Pre-existing out of facility DNR order (yellow form or pink MOST form) -     Chief Complaint  Patient presents with  . Medical Management of Chronic Issues    Routine visit    HPI:  Pt is a 83 y.o. female seen today for medical management of chronic diseases.    Patient has h/o Hyperkalemia secondary to Chronic renal insufficiency, hypertension, anemia secondary And dementia without any behavior issues.  Patient is doing well in facility. She moves around in her wheelchair. Her weight is stable at 112 lbs. No New Nursing issues. Her appetite is good and she has not had any behavior issues. Past Medical History:  Diagnosis Date  . Anemia   . Anxiety   . Dementia   . Dysphagia, oropharyngeal phase   . GERD (gastroesophageal reflux disease)   . Hyperpotassemia   . Hyperpotassemia   . Hypertension   . Other severe protein-calorie malnutrition    Past Surgical History:  Procedure Laterality Date  . ABDOMINAL HYSTERECTOMY    . APPENDECTOMY      No Known  Allergies  Outpatient Encounter Prescriptions as of 01/04/2017  Medication Sig  . amLODipine (NORVASC) 5 MG tablet Take 5 mg by mouth daily. For HTN  . labetalol (NORMODYNE) 200 MG tablet Take 200 mg by mouth 2 (two) times daily. For HTN  . ranitidine (ZANTAC) 150 MG tablet Take 150 mg by mouth daily.  . sodium polystyrene (KAYEXALATE) 15 GM/60ML suspension Take 15 g by mouth once. Once a day on Sun,Tue,Thu   No facility-administered encounter medications on file as of 01/04/2017.      Review of Systems  Review of Systems  Constitutional: Negative for activity change, appetite change, chills, diaphoresis, fatigue and fever.  HENT: Negative for mouth sores, postnasal drip, rhinorrhea, sinus pain and sore throat.   Respiratory: Negative for apnea, cough, chest tightness, shortness of breath and wheezing.   Cardiovascular: Negative for chest pain, palpitations and leg swelling.  Gastrointestinal: Negative for abdominal distention, abdominal pain, constipation, diarrhea, nausea and vomiting.  Genitourinary: Negative for dysuria and frequency.  Musculoskeletal: Negative for arthralgias, joint swelling and myalgias.  Skin: Negative for rash.  Neurological: Negative for dizziness, syncope, weakness, light-headedness and numbness.  Psychiatric/Behavioral: Negative for behavioral problems, confusion and sleep disturbance.     Immunization History  Administered Date(s) Administered  . Influenza-Unspecified 05/23/2014, 05/18/2016  . Pneumococcal-Unspecified 05/25/2016   Pertinent  Health Maintenance Due  Topic Date Due  . DEXA SCAN  02/10/2017 (Originally 04/30/1986)  . INFLUENZA VACCINE  03/16/2017  . PNA vac Low  Risk Adult (2 of 2 - PCV13) 05/25/2017   No flowsheet data found. Functional Status Survey:    Vitals:   01/02/17 0934  BP: 126/74  Pulse: 70  Resp: 20  Temp: (!) 96 F (35.6 C)  TempSrc: Oral   There is no height or weight on file to calculate BMI. Physical Exam    Constitutional: She appears well-developed.  HENT:  Head: Normocephalic.  Mouth/Throat: Oropharynx is clear and moist.  Eyes: Pupils are equal, round, and reactive to light.  Neck: Neck supple.  Cardiovascular: Normal rate, regular rhythm and normal heart sounds.   Pulmonary/Chest: Effort normal and breath sounds normal.  Abdominal: Soft. Bowel sounds are normal. She exhibits no distension. There is no tenderness. There is no rebound.  Musculoskeletal: She exhibits edema.  Lymphadenopathy:    She has no cervical adenopathy.  Neurological: She is alert.  Not oriented. Does not know the name of the Place or year. She knows her DOB. Skin: Skin is warm and dry.  Psychiatric: She has a normal mood and affect. Her behavior is normal. Thought content normal.  Labs reviewed:  Recent Labs  07/01/16 0500 08/25/16 0500 11/23/16 0700  NA 137 137 137  K 4.4 4.0 4.6  CL 106 106 104  CO2 26 25 25   GLUCOSE 88 87 87  BUN 38* 33* 39*  CREATININE 1.36* 1.31* 1.36*  CALCIUM 9.4 9.0 9.2   No results for input(s): AST, ALT, ALKPHOS, BILITOT, PROT, ALBUMIN in the last 8760 hours.  Recent Labs  04/27/16 0500  07/01/16 0500 08/25/16 0500 11/23/16 0700  WBC 5.2  < > 5.2 5.0 4.8  NEUTROABS 2.7  --  2.6 2.6  --   HGB 9.2*  < > 9.0* 9.2* 9.3*  HCT 27.6*  < > 27.2* 27.7* 27.6*  MCV 98.6  < > 98.2 98.2 97.9  PLT 232  < > 227 246 216  < > = values in this interval not displayed. Lab Results  Component Value Date   TSH 2.567 01/23/2016   No results found for: HGBA1C No results found for: CHOL, HDL, LDLCALC, LDLDIRECT, TRIG, CHOLHDL  Significant Diagnostic Results in last 30 days:  No results found.  Assessment/Plan  Essential hypertension Stable on Labetalol and Norvasc  Gastroesophageal reflux disease  On Zantac and doing well   Dementia  Doing well. No Behavior issues.  Stage 3 chronic kidney disease Creat Stable   Hyperkalemia Continue Kayexalate  Anemia Due to  Renal Insufficiency. HGB stable Labs reviewed from 01/18 Will repeat her labs including Vit D, TSH, And Serum calcium. Family/ staff Communication:   Labs/tests ordered:    Total time spent in this patient care encounter was 25_ minutes; greater than 50% of the visit spent counseling patient and coordinating care for problems addressed at this encounter.

## 2017-01-06 ENCOUNTER — Encounter (HOSPITAL_COMMUNITY)
Admission: RE | Admit: 2017-01-06 | Discharge: 2017-01-06 | Disposition: A | Discharge status: Home / Self Care | Payer: Medicare Other | Source: Skilled Nursing Facility | Attending: Internal Medicine | Admitting: Internal Medicine

## 2017-01-06 DIAGNOSIS — R293 Abnormal posture: Secondary | ICD-10-CM | POA: Diagnosis present

## 2017-01-06 DIAGNOSIS — I10 Essential (primary) hypertension: Secondary | ICD-10-CM | POA: Insufficient documentation

## 2017-01-06 DIAGNOSIS — M6281 Muscle weakness (generalized): Secondary | ICD-10-CM | POA: Diagnosis present

## 2017-01-06 DIAGNOSIS — R296 Repeated falls: Secondary | ICD-10-CM | POA: Diagnosis present

## 2017-01-06 DIAGNOSIS — R1312 Dysphagia, oropharyngeal phase: Secondary | ICD-10-CM | POA: Insufficient documentation

## 2017-01-06 DIAGNOSIS — E43 Unspecified severe protein-calorie malnutrition: Secondary | ICD-10-CM | POA: Diagnosis present

## 2017-01-06 DIAGNOSIS — Z9181 History of falling: Secondary | ICD-10-CM | POA: Insufficient documentation

## 2017-01-06 LAB — COMPREHENSIVE METABOLIC PANEL
ALT: 8 U/L — ABNORMAL LOW (ref 14–54)
ANION GAP: 7 (ref 5–15)
AST: 19 U/L (ref 15–41)
Albumin: 3.5 g/dL (ref 3.5–5.0)
Alkaline Phosphatase: 77 U/L (ref 38–126)
BUN: 30 mg/dL — ABNORMAL HIGH (ref 6–20)
CHLORIDE: 105 mmol/L (ref 101–111)
CO2: 25 mmol/L (ref 22–32)
Calcium: 9.1 mg/dL (ref 8.9–10.3)
Creatinine, Ser: 1.32 mg/dL — ABNORMAL HIGH (ref 0.44–1.00)
GFR calc Af Amer: 39 mL/min — ABNORMAL LOW (ref >60)
GFR calc non Af Amer: 34 mL/min — ABNORMAL LOW (ref >60)
GLUCOSE: 84 mg/dL (ref 65–99)
POTASSIUM: 4.4 mmol/L (ref 3.5–5.1)
SODIUM: 137 mmol/L (ref 135–145)
Total Bilirubin: 0.6 mg/dL (ref 0.3–1.2)
Total Protein: 6.5 g/dL (ref 6.5–8.1)

## 2017-01-06 LAB — CBC WITH DIFFERENTIAL/PLATELET
BASOS ABS: 0 10*3/uL (ref 0.0–0.1)
BASOS PCT: 0 %
EOS PCT: 4 %
Eosinophils Absolute: 0.2 10*3/uL (ref 0.0–0.7)
HEMATOCRIT: 27.8 % — AB (ref 36.0–46.0)
Hemoglobin: 9.4 g/dL — ABNORMAL LOW (ref 12.0–15.0)
Lymphocytes Relative: 29 %
Lymphs Abs: 1.6 10*3/uL (ref 0.7–4.0)
MCH: 32.9 pg (ref 26.0–34.0)
MCHC: 33.8 g/dL (ref 30.0–36.0)
MCV: 97.2 fL (ref 78.0–100.0)
Monocytes Absolute: 0.7 10*3/uL (ref 0.1–1.0)
Monocytes Relative: 12 %
NEUTROS ABS: 3.1 10*3/uL (ref 1.7–7.7)
Neutrophils Relative %: 55 %
PLATELETS: 261 10*3/uL (ref 150–400)
RBC: 2.86 MIL/uL — ABNORMAL LOW (ref 3.87–5.11)
RDW: 13.1 % (ref 11.5–15.5)
WBC: 5.6 10*3/uL (ref 4.0–10.5)

## 2017-01-06 LAB — TSH: TSH: 2.867 u[IU]/mL (ref 0.350–4.500)

## 2017-01-07 LAB — VITAMIN D 25 HYDROXY (VIT D DEFICIENCY, FRACTURES): Vit D, 25-Hydroxy: 30.6 ng/mL (ref 30.0–100.0)

## 2017-01-26 ENCOUNTER — Non-Acute Institutional Stay (SKILLED_NURSING_FACILITY): Payer: Medicare Other | Admitting: Internal Medicine

## 2017-01-26 ENCOUNTER — Encounter: Payer: Self-pay | Admitting: Internal Medicine

## 2017-01-26 DIAGNOSIS — E875 Hyperkalemia: Secondary | ICD-10-CM | POA: Diagnosis not present

## 2017-01-26 DIAGNOSIS — F0390 Unspecified dementia without behavioral disturbance: Secondary | ICD-10-CM | POA: Diagnosis not present

## 2017-01-26 DIAGNOSIS — N183 Chronic kidney disease, stage 3 unspecified: Secondary | ICD-10-CM

## 2017-01-26 DIAGNOSIS — K219 Gastro-esophageal reflux disease without esophagitis: Secondary | ICD-10-CM | POA: Diagnosis not present

## 2017-01-26 DIAGNOSIS — I10 Essential (primary) hypertension: Secondary | ICD-10-CM

## 2017-01-26 NOTE — Progress Notes (Signed)
Location:   [facility] Room Number: 109/W Place of Service:  SNF [phone]) Provider:  Gardiner Fanti, Rene Kocher, MD  Patient Care Team: [name], MD as PCP - General (Internal Medicine)  Extended Emergency Contact Information Primary Emergency Contact: High,[name] Address: [address]          [name], [place] [address] [place] Phone: [phone] Relation: Other Secondary Emergency Contact: [name], [place] [address] [name] of [place] Phone: [phone] Mobile Phone: [phone] Relation: Niece  Code Status:  Full Code Goals of care: Advanced Directive information Advanced Directives 01/26/2017  Does Patient Have a Medical Advance Directive? Yes  Type of Advance Directive (No Data)  Does patient want to make changes to medical advance directive? No - Patient declined  Copy of [place] in Chart? -  Pre-existing out of facility DNR order (yellow form or pink MOST form) -     Chief Complaint  Patient presents with  . Medical Management of Chronic Issues    Routine Visit  For medical management of chronic medical conditions including dementia-chronic kidney disease-hypertension-anemia-GERD-  HPI:  Pt is a 83 y.o. female seen today for medical management of chronic diseases.  As noted above.  She continues to be quite stable ambulating frequently around the facility in her wheelchair largely in a pleasant somewhat confused manner.  At times apparently later in the day she gets some agitation and increased confusion but this has been relatively stable.  In regards to dementia she is basically on supportive care appears to be doing well weight has been stable but if scales are accurate she's lost about 5 pounds in the past month.  She says she eats fairly well I did speak with nursing staff who states she does have somewhat a sporadic appetite does not eat very well in the  morning but appetite improves later in the day although they don't think she eats a whole lot.  Per chart review it appears she eats 50-75% of her dinner and lunch-about 25% of her breakfast  Regards to hypertension this appears stable on Norvasc and labetalol 126/75-109/54.  She does have a history of chronic renal insufficiency. Associated hyperkalemia for this has been stable with initiation of Kayexalate-most recent creatinine 1.32 BUN of 30 potassium 4.4 on lab in late May appears to be stable.  She also has anemia slightly high renal disease which is stable at 9.4 on lab done in late May   Past Medical History:  Diagnosis Date  . Anemia   . Anxiety   . Dementia   . Dysphagia, oropharyngeal phase   . GERD (gastroesophageal reflux disease)   . Hyperpotassemia   . Hyperpotassemia   . Hypertension   . Other severe protein-calorie malnutrition    Past Surgical History:  Procedure Laterality Date  . ABDOMINAL HYSTERECTOMY    . APPENDECTOMY      No Known Allergies  Outpatient Encounter Prescriptions as of 01/26/2017  Medication Sig  . amLODipine (NORVASC) 5 MG tablet Take 5 mg by mouth daily. For HTN  . labetalol (NORMODYNE) 200 MG tablet Take 200 mg by mouth 2 (two) times daily. For HTN  . ranitidine (ZANTAC) 150 MG tablet Take 150 mg by mouth daily.  . sodium polystyrene (KAYEXALATE) 15 GM/60ML suspension Take 15 g by mouth once. Once a day on Sun,Tue,Thu   No facility-administered encounter medications on file  as of 01/26/2017.     Review of Systems  This is somewhat limited secondary to dementia provided by patient and nursing.  General does not complaining any fever or chills.  Skin rashes or itching.  Head ears eyes nose mouth and throat does not complain of any visual changes she has prescription lenses does not complain of sore throat or difficulty swallowing.  Respiratory denies any cough or shortness of breath.  Cardiac denies chest pain does not have leg  swelling.  GI he is not complaining of any abdominal pain nausea or vomiting diarrhea, patient.  GU denies dysuria.  Muscle skeletal is not complaining of any joint pain.  Neurologic complaining of dizziness headache syncope or numbness.  Psych does have a history of dementia apparently occasional anxiety this has not been requiring treatment at this time  Immunization History  Administered Date(s) Administered  . Influenza-Unspecified 05/23/2014, 05/18/2016  . Pneumococcal-Unspecified 05/25/2016   Pertinent  Health Maintenance Due  Topic Date Due  . DEXA SCAN  02/10/2017 (Originally 04/30/1986)  . INFLUENZA VACCINE  03/16/2017  . PNA vac Low Risk Adult (2 of 2 - PCV13) 05/25/2017   No flowsheet data found. Functional Status Survey:    Vitals:   01/26/17 1545  BP: 126/75  Pulse: 70  Resp: (!) 28  Temp: 97.8 F (36.6 C)  TempSrc: Oral  Respirations are recheck 420 Week is 107.4 pounds Physical Exam In general this is a pleasant elderly female who does not appear to be in any distress sitting comfortably in her wheelchair   Her skin is warm and dry.  Eyes she has prescription lenses pupils appear reactive to light visual acuity appears grossly intact.  Oropharynx is clear mucous membranes moist she has numerous extractions.  Chest is clear to auscultation there is no labored breathing.  Heart is regular rate and rhythm --- without murmur gallop or rub she does not have significant lower extremity edema.  Abdomen is soft nontender with positive bowel sounds.  Muscle skeletal has general frailty but ambulates well in a wheelchair I do not note any deformities other than arthritic and she continues to have somewhat of a lump on her right upper arm question possible biceps abnormality but this is not bothering her.  Neurologic is grossly intact her speech is clear no lateralizing findings.Strength appears preserved in all 4 extremitiescontinues to ambulate  without difficulty in her wheelchair about the entire facility  Psych she is oriented to self pleasant and appropriate continues to be smiling and in good humor-she does know her birthday and at times can give you a somewhat accurate history-she had a visitor earlier today and was accurate about recalling relationship to her-however cannot recall the name of the dog that was with her visitor Labs reviewed:  Recent Labs  08/25/16 0500 11/23/16 0700 01/06/17 0729  NA 137 137 137  K 4.0 4.6 4.4  CL 106 104 105  CO2 25 25 25   GLUCOSE 87 87 84  BUN 33* 39* 30*  CREATININE 1.31* 1.36* 1.32*  CALCIUM 9.0 9.2 9.1    Recent Labs  01/06/17 0729  AST 19  ALT 8*  ALKPHOS 77  BILITOT 0.6  PROT 6.5  ALBUMIN 3.5    Recent Labs  07/01/16 0500 08/25/16 0500 11/23/16 0700 01/06/17 0729  WBC 5.2 5.0 4.8 5.6  NEUTROABS 2.6 2.6  --  3.1  HGB 9.0* 9.2* 9.3* 9.4*  HCT 27.2* 27.7* 27.6* 27.8*  MCV 98.2 98.2 97.9 97.2  PLT  227 246 216 261   Lab Results  Component Value Date   TSH 2.867 01/06/2017   No results found for: HGBA1C No results found for: CHOL, HDL, LDLCALC, LDLDIRECT, TRIG, CHOLHDL  Significant Diagnostic Results in last 30 days:  No results found.  Assessment/Plan  1 dementia-this appears to be stable with supportive care occasionally does have some inside EF night this will have to be monitored-she has had some weight loss although some of this may be scale variation will order reweight and if it legitimate consider a dietary consult. Her albumin was 3.5 on lab done in late May.  #2 hypertension this appears stable as noted above on labetalol and Norvasc.  #3 history of chronic renal insufficiency with hyperkalemia this has been stabilized on Kayexalate creatinine 1.3 to appears relatively stable as well as potassium 4.4 on recent lab will monitor periodically but this has been stable now for some time.  #4 history of anemia most likely due to chronic renal disease  hemoglobin is stable at 9.4.  #5 history of GERD this appears relatively asymptomatic she is on Zantac.  [id number]

## 2017-02-21 ENCOUNTER — Non-Acute Institutional Stay (SKILLED_NURSING_FACILITY): Payer: Medicare Other | Admitting: Internal Medicine

## 2017-02-21 ENCOUNTER — Encounter: Payer: Self-pay | Admitting: Internal Medicine

## 2017-02-21 DIAGNOSIS — D631 Anemia in chronic kidney disease: Secondary | ICD-10-CM

## 2017-02-21 DIAGNOSIS — I10 Essential (primary) hypertension: Secondary | ICD-10-CM

## 2017-02-21 DIAGNOSIS — N183 Chronic kidney disease, stage 3 unspecified: Secondary | ICD-10-CM

## 2017-02-21 DIAGNOSIS — E875 Hyperkalemia: Secondary | ICD-10-CM

## 2017-02-21 DIAGNOSIS — F0390 Unspecified dementia without behavioral disturbance: Secondary | ICD-10-CM

## 2017-02-21 NOTE — Progress Notes (Signed)
Location:   [facility] Room Number: 109/W Place of Service:  SNF [phone]) Provider:  Kyung Rudd, Rene Kocher, MD  Patient Care Team: [name], MD as PCP - General (Internal Medicine)  Extended Emergency Contact Information Primary Emergency Contact: High,[name] Address: [address]          [place], [place] [address] [place] Phone: [phone] Relation: Other Secondary Emergency Contact: [name], [place] [address] [name] of [place] Phone: [phone] Mobile Phone: [phone] Relation: Niece  Code Status:  Full Code Goals of care: Advanced Directive information Advanced Directives 02/21/2017  Does Patient Have a Medical Advance Directive? Yes  Type of Advance Directive (No Data)  Does patient want to make changes to medical advance directive? No - Patient declined  Copy of [place] in Chart? -  Pre-existing out of facility DNR order (yellow form or pink MOST form) -     Chief Complaint  Patient presents with  . Medical Management of Chronic Issues    Routine Visit    HPI:  Pt is a 83 y.o. female seen today for medical management of chronic diseases.    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  Patient goes around in her Wheel chair. Her weight is 110 lbs. There was some concern about her weight loss but it has stabilized. She says her appetite is good. No new Nursing issues.  Past Medical History:  Diagnosis Date  . Anemia   . Anxiety   . Dementia   . Dysphagia, oropharyngeal phase   . GERD (gastroesophageal reflux disease)   . Hyperpotassemia   . Hyperpotassemia   . Hypertension   . Other severe protein-calorie malnutrition    Past Surgical History:  Procedure Laterality Date  . ABDOMINAL HYSTERECTOMY    . APPENDECTOMY      No  Known Allergies  Outpatient Encounter Prescriptions as of 02/21/2017  Medication Sig  . amLODipine (NORVASC) 5 MG tablet Take 5 mg by mouth daily. For HTN  . labetalol (NORMODYNE) 200 MG tablet Take 200 mg by mouth 2 (two) times daily. For HTN  . ranitidine (ZANTAC) 150 MG tablet Take 150 mg by mouth daily.  . sodium polystyrene (KAYEXALATE) 15 GM/60ML suspension Take 15 g by mouth once. Once a day on Sun,Tue,Thu   No facility-administered encounter medications on file as of 02/21/2017.     Review of Systems  Review of Systems  Constitutional: Negative for activity change, appetite change, chills, diaphoresis, fatigue and fever.  HENT: Negative for mouth sores, postnasal drip, rhinorrhea, sinus pain and sore throat.   Respiratory: Negative for apnea, cough, chest tightness, shortness of breath and wheezing.   Cardiovascular: Negative for chest pain, palpitations and leg swelling.  Gastrointestinal: Negative for abdominal distention, abdominal pain, constipation, diarrhea, nausea and vomiting.  Genitourinary: Negative for dysuria and frequency.  Musculoskeletal: Negative for arthralgias, joint swelling and myalgias.  Skin: Negative for rash.  Neurological: Negative for dizziness, syncope, weakness, light-headedness and numbness.  Psychiatric/Behavioral: Negative for behavioral problems, confusion and sleep disturbance.     Immunization History  Administered Date(s) Administered  . Influenza-Unspecified 05/23/2014, 05/18/2016  . Pneumococcal-Unspecified 05/25/2016   Pertinent  Health Maintenance Due  Topic Date Due  . DEXA SCAN  04/30/1988  . INFLUENZA VACCINE  03/16/2017  . PNA vac Low  Risk Adult (2 of 2 - PCV13) 05/25/2017   No flowsheet data found. Functional Status Survey:    Vitals:   02/21/17 1432  BP: 120/66  Pulse: 64  Resp: 18  Temp: 98.2 F (36.8 C)  TempSrc: Oral   There is no height or weight on file to calculate BMI. Physical Exam Constitutional: She appears  well-developed.  HENT:  Head: Normocephalic.  Mouth/Throat: Oropharynx is clear and moist. Poor Dental Hygiene ? Gingivitis. Eyes: Pupils are equal, round, and reactive to light.  Neck: Neck supple.  Cardiovascular: Normal rate, regular rhythmand normal heart sounds.  Pulmonary/Chest: Effort normaland breath sounds normal.  Abdominal: Soft. Bowel sounds are normal. She exhibits no distension. There is no tenderness. There is no rebound.  Musculoskeletal: She exhibits edema.  Lymphadenopathy:  She has no cervical adenopathy.  Neurological: She is alert.  Not oriented. Does not know the name of the Place or year. She knows her DOB. Skin: Skin is warmand dry.  Psychiatric: She has a normal mood and affect. Her behavior is normal. Thought contentnormal.    Labs reviewed:  Recent Labs  08/25/16 0500 11/23/16 0700 01/06/17 0729  NA 137 137 137  K 4.0 4.6 4.4  CL 106 104 105  CO2 25 25 25   GLUCOSE 87 87 84  BUN 33* 39* 30*  CREATININE 1.31* 1.36* 1.32*  CALCIUM 9.0 9.2 9.1    Recent Labs  01/06/17 0729  AST 19  ALT 8*  ALKPHOS 77  BILITOT 0.6  PROT 6.5  ALBUMIN 3.5    Recent Labs  07/01/16 0500 08/25/16 0500 11/23/16 0700 01/06/17 0729  WBC 5.2 5.0 4.8 5.6  NEUTROABS 2.6 2.6  --  3.1  HGB 9.0* 9.2* 9.3* 9.4*  HCT 27.2* 27.7* 27.6* 27.8*  MCV 98.2 98.2 97.9 97.2  PLT 227 246 216 261   Lab Results  Component Value Date   TSH 2.867 01/06/2017   No results found for: HGBA1C No results found for: CHOL, HDL, LDLCALC, LDLDIRECT, TRIG, CHOLHDL  Significant Diagnostic Results in last 30 days:  No results found.  Assessment/Plan  Essential hypertension Stable on Labetalol and Norvasc Continue same dose  Gastroesophageal reflux disease On Zantac and doing well   Dementia  Doing well. No Behavior issues.  Stage 3 chronic kidney disease Creat Stable  Labs reviewed in 05/18  Hyperkalemia Continue Kayexalate  Anemia Due to Renal  Insufficiency. HGB stable Gingivitis Dental consult Family/ staff Communication:   Labs/tests ordered:    Labs reviewed from 05/18 Next follow up labs next month.

## 2017-03-16 ENCOUNTER — Encounter: Payer: Self-pay | Admitting: Internal Medicine

## 2017-03-16 ENCOUNTER — Non-Acute Institutional Stay (SKILLED_NURSING_FACILITY): Payer: Medicare Other | Admitting: Internal Medicine

## 2017-03-16 DIAGNOSIS — E875 Hyperkalemia: Secondary | ICD-10-CM | POA: Diagnosis not present

## 2017-03-16 DIAGNOSIS — K219 Gastro-esophageal reflux disease without esophagitis: Secondary | ICD-10-CM | POA: Diagnosis not present

## 2017-03-16 DIAGNOSIS — D631 Anemia in chronic kidney disease: Secondary | ICD-10-CM | POA: Diagnosis not present

## 2017-03-16 DIAGNOSIS — N183 Chronic kidney disease, stage 3 unspecified: Secondary | ICD-10-CM

## 2017-03-16 DIAGNOSIS — I10 Essential (primary) hypertension: Secondary | ICD-10-CM | POA: Diagnosis not present

## 2017-03-16 DIAGNOSIS — F0390 Unspecified dementia without behavioral disturbance: Secondary | ICD-10-CM | POA: Diagnosis not present

## 2017-03-16 NOTE — Progress Notes (Signed)
Location:   [facility] Room Number: 109/W Place of Service:  SNF [phone]) Provider:  Gardiner Fanti, Rene Kocher, MD  Patient Care Team: [name], MD as PCP - General (Internal Medicine)  Extended Emergency Contact Information Primary Emergency Contact: [name] Address: [address]          [place], [place] [address] [place] Phone: [phone] Relation: Other Secondary Emergency Contact: [name], [place] [address] [name] Phone: [phone] Mobile Phone: [phone] Relation: Niece  Code Status:  Full Code Goals of care: Advanced Directive information Advanced Directives 03/16/2017  Does Patient Have a Medical Advance Directive? Yes  Type of Advance Directive (No Data)  Does patient want to make changes to medical advance directive? No - Patient declined  Copy of [place] in Chart? -  Pre-existing out of facility DNR order (yellow form or pink MOST form) -     Chief Complaint  Patient presents with  . Medical Management of Chronic Issues    Routine Visit, Due Dexa scan   This is a routine visit for medical management of chronic medical conditions including dementia-chronic kidney disease-hypertension-GERD-anemia of chronic disease  HPI:  Pt is a 83 y.o. female seen today for medical management of chronic diseases as noted above.  She continues to be quite stableis able to ambulate about the facility in her wheelchair at times will have periods of agitation more so at night but apparently this has been quite well-controlled on no medication currently-she does well with supportive care weight appears to be stable at around 110 pounds.  She does have a history of hyperkalemia with renal insufficiency she is on Kayexalate 3 times a week-lab done back in May showed stability with a potassium of 4.4-BUN of 30 and creatinine of 1.32 this will warrant  updating.  She also has a history of anemia of chronic disease hemoglobin 9.4 back in May appear to be stable.  Regards to hypertension she is on labetalol and Norvasc this appears stable recent blood pressures 128/72-120/66-today she has no complaints she is ambulating about a wheelchair visiting with some resident's she is confused but very pleasant most the time again has some nighttime confusion and agitation. Periodically    Past Medical History:  Diagnosis Date  . Anemia   . Anxiety   . Dementia   . Dysphagia, oropharyngeal phase   . GERD (gastroesophageal reflux disease)   . Hyperpotassemia   . Hyperpotassemia   . Hypertension   . Other severe protein-calorie malnutrition    Past Surgical History:  Procedure Laterality Date  . ABDOMINAL HYSTERECTOMY    . APPENDECTOMY      No Known Allergies  Outpatient Encounter Prescriptions as of 03/16/2017  Medication Sig  . amLODipine (NORVASC) 5 MG tablet Take 5 mg by mouth daily. For HTN  . labetalol (NORMODYNE) 200 MG tablet Take 200 mg by mouth 2 (two) times daily. For HTN  . ranitidine (ZANTAC) 150 MG tablet Take 150 mg by mouth daily.  . sodium polystyrene (KAYEXALATE) 15 GM/60ML suspension Take 15 g by mouth once. Once a day on Sun,Tue,Thu   No facility-administered encounter medications on file as of 03/16/2017.      Review of Systems This is somewhat limited secondary to dementia provided by patient and nursing.  General does not complaining any fever or chills.Her weight appears to have stabilized  Skin  does not have rashes or itching-.  Head ears eyes nose mouth and throat does not complain of any visual changes she has prescription lenses does not complain of sore throat or difficulty swallowing.  Respiratory denies any cough or shortness of breath.  Cardiac denies chest pain does not have leg swelling.  GI  is not complaining of any abdominal pain nausea or vomiting diarrhea, appetite continues to be  somewhat sporadic but her weight appears to have stabilized  GU denies dysuria.  Muscle skeletal is not complaining of any joint pain.  Neurologic complaining of dizziness headache syncope or numbness.  Psych does have a history of dementia apparently occasional anxiety this has not been requiring treatment at this time this has been stable for some time Immunization History  Administered Date(s) Administered  . Influenza-Unspecified 05/23/2014, 05/18/2016  . Pneumococcal-Unspecified 05/25/2016   Pertinent  Health Maintenance Due  Topic Date Due  . DEXA SCAN  04/16/2017 (Originally [birth year]30/1986)  . INFLUENZA VACCINE  05/18/2017 (Originally 03/16/2017)  . PNA vac Low Risk Adult (2 of 2 - PCV13) 05/25/2017   No flowsheet data found. Functional Status Survey:    Vitals:   03/16/17 1352  BP: 128/72  Pulse: 77  Resp: 18  Temp: 98.7 F (37.1 C)  TempSrc: Oral  SpO2: 100%  Weight: 110 lb (49.9 kg)  Height: 5\' 5"  (1.651 m)   Body mass index is 18.3 kg/m. Physical Exam In general this is a pleasant elderly female who does not appear to be in any distress sitting comfortably in her wheelchair she is pleasant and conversant  Her skin is warm and dry.  Eyes she has prescription lenses pupils appear reactive to light visual acuity appears grossly intact.  Oropharynx is clear mucous membranes moist she has numerous extractions. Consult pending for apparent gingivitis  Chest is clear to auscultation there is no labored breathing.   Heart is regular rate and rhythm---without murmur gallop or rub she does not have significant lower extremity edema.  Abdomen is soft nontender with positive bowel sounds.  Muscle skeletal has general frailty but ambulates well in a wheelchair I do not note any deformities other than arthritic and she continues to have somewhat of a lump on her right upper arm question possible biceps abnormality but this is not bothering her.--It is nontender  flesh-colored  Neurologic is grossly intact her speech is clear no lateralizing findings.Strength appears preserved in all 4 extremitiescontinues to ambulate without difficulty in her wheelchair about the entire facility  Psych she is oriented to self pleasant and appropriate continues to be smiling and in good humor-she does know her birthday and at times can give you a somewhat accurate history-s She had times will read the newspaper and has limited recall what she read Labs reviewed:  Recent Labs  08/25/16 0500 11/23/16 0700 01/06/17 0729  NA 137 137 137  K 4.0 4.6 4.4  CL 106 104 105  CO2 25 25 25   GLUCOSE 87 87 84  BUN 33* 39* 30*  CREATININE 1.31* 1.36* 1.32*  CALCIUM 9.0 9.2 9.1    Recent Labs  01/06/17 0729  AST 19  ALT 8*  ALKPHOS 77  BILITOT 0.6  PROT 6.5  ALBUMIN 3.5    Recent Labs  07/01/16 0500 08/25/16 0500 11/23/16 0700 01/06/17 0729  WBC 5.2 5.0 4.8 5.6  NEUTROABS 2.6 2.6  --  3.1  HGB 9.0* 9.2* 9.3* 9.4*  HCT 27.2* 27.7* 27.6* 27.8*  MCV 98.2 98.2 97.9 97.2  PLT 227 246 216 261   Lab Results  Component Value Date   TSH 2.867 01/06/2017   No results found for: HGBA1C No results found for: CHOL, HDL, LDLCALC, LDLDIRECT, TRIG, CHOLHDL  Significant Diagnostic Results in last 30 days:  No results found.  Assessment/Plan  #1-history of hyperkalemia with renal insufficiency this appears stable on Kayexalate creatinine 1.32 BUN of 30 on lab done in late May we will update this.  #2 history of chronic kidney disease-see above.  #3 anemia most likely of chronic disease this appears stable at 9.4 we'll update this as well.  4 history of hypertension this appears stable as noted above she is on labetalol as well as Norvasc.  #5 history of dementia her weight appears to have stabilized she is doing well with supportive care continues to ambulate out facility usually in a pleasant manner gets a bit agitated at times more confused at  night.  History of GERD-this appears stable on Zantac   [phone]

## 2017-03-17 ENCOUNTER — Encounter (HOSPITAL_COMMUNITY)
Admission: RE | Admit: 2017-03-17 | Discharge: 2017-03-17 | Disposition: A | Discharge status: Home / Self Care | Payer: Medicare Other | Source: Skilled Nursing Facility | Attending: Internal Medicine | Admitting: Internal Medicine

## 2017-03-17 DIAGNOSIS — N189 Chronic kidney disease, unspecified: Secondary | ICD-10-CM | POA: Insufficient documentation

## 2017-03-17 DIAGNOSIS — I10 Essential (primary) hypertension: Secondary | ICD-10-CM | POA: Diagnosis not present

## 2017-03-17 DIAGNOSIS — D631 Anemia in chronic kidney disease: Secondary | ICD-10-CM | POA: Insufficient documentation

## 2017-03-17 DIAGNOSIS — K219 Gastro-esophageal reflux disease without esophagitis: Secondary | ICD-10-CM | POA: Diagnosis present

## 2017-03-17 LAB — BASIC METABOLIC PANEL
ANION GAP: 7 (ref 5–15)
BUN: 21 mg/dL — ABNORMAL HIGH (ref 6–20)
CALCIUM: 9.3 mg/dL (ref 8.9–10.3)
CO2: 25 mmol/L (ref 22–32)
Chloride: 104 mmol/L (ref 101–111)
Creatinine, Ser: 1.1 mg/dL — ABNORMAL HIGH (ref 0.44–1.00)
GFR calc non Af Amer: 42 mL/min — ABNORMAL LOW (ref >60)
GFR, EST AFRICAN AMERICAN: 49 mL/min — AB (ref >60)
Glucose, Bld: 86 mg/dL (ref 65–99)
Potassium: 4.2 mmol/L (ref 3.5–5.1)
Sodium: 136 mmol/L (ref 135–145)

## 2017-03-17 LAB — CBC
HCT: 27.2 % — ABNORMAL LOW (ref 36.0–46.0)
HEMOGLOBIN: 8.8 g/dL — AB (ref 12.0–15.0)
MCH: 32.5 pg (ref 26.0–34.0)
MCHC: 32.4 g/dL (ref 30.0–36.0)
MCV: 100.4 fL — ABNORMAL HIGH (ref 78.0–100.0)
Platelets: 207 10*3/uL (ref 150–400)
RBC: 2.71 MIL/uL — AB (ref 3.87–5.11)
RDW: 12.8 % (ref 11.5–15.5)
WBC: 4.8 10*3/uL (ref 4.0–10.5)

## 2017-03-24 ENCOUNTER — Encounter (HOSPITAL_COMMUNITY)
Admission: RE | Admit: 2017-03-24 | Discharge: 2017-03-24 | Disposition: A | Discharge status: Home / Self Care | Payer: Medicare Other | Source: Skilled Nursing Facility | Attending: *Deleted | Admitting: *Deleted

## 2017-03-24 DIAGNOSIS — I10 Essential (primary) hypertension: Secondary | ICD-10-CM | POA: Diagnosis not present

## 2017-03-24 LAB — CBC WITH DIFFERENTIAL/PLATELET
Basophils Absolute: 0 10*3/uL (ref 0.0–0.1)
Basophils Relative: 1 %
EOS PCT: 4 %
Eosinophils Absolute: 0.3 10*3/uL (ref 0.0–0.7)
HCT: 26.8 % — ABNORMAL LOW (ref 36.0–46.0)
Hemoglobin: 8.9 g/dL — ABNORMAL LOW (ref 12.0–15.0)
LYMPHS ABS: 1.9 10*3/uL (ref 0.7–4.0)
Lymphocytes Relative: 32 %
MCH: 32.5 pg (ref 26.0–34.0)
MCHC: 33.2 g/dL (ref 30.0–36.0)
MCV: 97.8 fL (ref 78.0–100.0)
MONO ABS: 0.8 10*3/uL (ref 0.1–1.0)
Monocytes Relative: 14 %
Neutro Abs: 3 10*3/uL (ref 1.7–7.7)
Neutrophils Relative %: 49 %
PLATELETS: 204 10*3/uL (ref 150–400)
RBC: 2.74 MIL/uL — ABNORMAL LOW (ref 3.87–5.11)
RDW: 13.5 % (ref 11.5–15.5)
WBC: 6 10*3/uL (ref 4.0–10.5)

## 2017-03-31 ENCOUNTER — Encounter (HOSPITAL_COMMUNITY)
Admission: RE | Admit: 2017-03-31 | Discharge: 2017-03-31 | Disposition: A | Discharge status: Home / Self Care | Payer: Medicare Other | Source: Skilled Nursing Facility | Attending: *Deleted | Admitting: *Deleted

## 2017-03-31 DIAGNOSIS — I10 Essential (primary) hypertension: Secondary | ICD-10-CM | POA: Diagnosis not present

## 2017-03-31 LAB — CBC WITH DIFFERENTIAL/PLATELET
BASOS ABS: 0 10*3/uL (ref 0.0–0.1)
Basophils Relative: 0 %
Eosinophils Absolute: 0.2 10*3/uL (ref 0.0–0.7)
Eosinophils Relative: 4 %
HEMATOCRIT: 27.4 % — AB (ref 36.0–46.0)
Hemoglobin: 8.9 g/dL — ABNORMAL LOW (ref 12.0–15.0)
LYMPHS PCT: 33 %
Lymphs Abs: 1.5 10*3/uL (ref 0.7–4.0)
MCH: 32.4 pg (ref 26.0–34.0)
MCHC: 32.5 g/dL (ref 30.0–36.0)
MCV: 99.6 fL (ref 78.0–100.0)
MONO ABS: 0.5 10*3/uL (ref 0.1–1.0)
Monocytes Relative: 10 %
NEUTROS ABS: 2.4 10*3/uL (ref 1.7–7.7)
Neutrophils Relative %: 52 %
Platelets: 220 10*3/uL (ref 150–400)
RBC: 2.75 MIL/uL — AB (ref 3.87–5.11)
RDW: 12.8 % (ref 11.5–15.5)
WBC: 4.6 10*3/uL (ref 4.0–10.5)

## 2017-04-25 ENCOUNTER — Non-Acute Institutional Stay (SKILLED_NURSING_FACILITY): Payer: Medicare Other

## 2017-04-25 DIAGNOSIS — Z0000 Encounter for general adult medical examination without abnormal findings: Secondary | ICD-10-CM

## 2017-04-25 NOTE — Progress Notes (Signed)
Subjective:   Morgan [name] is a 83 y.o. female who presents for an Initial Medicare Annual Wellness Visit at [facility]       Objective:    Today's Vitals   04/25/17 1232  BP: 130/80  Pulse: 71  Temp: 98 F (36.7 C)  TempSrc: Oral  SpO2: 98%  Weight: 110 lb (49.9 kg)  Height: 5\' 5"  (1.651 m)   Body mass index is 18.3 kg/m.   Current Medications (verified) Outpatient Encounter Prescriptions as of 04/25/2017  Medication Sig  . amLODipine (NORVASC) 5 MG tablet Take 5 mg by mouth daily. For HTN  . labetalol (NORMODYNE) 200 MG tablet Take 200 mg by mouth 2 (two) times daily. For HTN  . ranitidine (ZANTAC) 150 MG tablet Take 150 mg by mouth daily.  . sodium polystyrene (KAYEXALATE) 15 GM/60ML suspension Take 15 g by mouth once. Once a day on Sun,Tue,Thu   No facility-administered encounter medications on file as of 04/25/2017.     Allergies (verified) Patient has no known allergies.   History: Past Medical History:  Diagnosis Date  . Anemia   . Anxiety   . Dementia   . Dysphagia, oropharyngeal phase   . GERD (gastroesophageal reflux disease)   . Hyperpotassemia   . Hyperpotassemia   . Hypertension   . Other severe protein-calorie malnutrition    Past Surgical History:  Procedure Laterality Date  . ABDOMINAL HYSTERECTOMY    . APPENDECTOMY     History reviewed. No pertinent family history. Social History   Occupational History  . Not on file.   Social History Main Topics  . Smoking status: Never Smoker  . Smokeless tobacco: Never Used  . Alcohol use No  . Drug use: No  . Sexual activity: No    Tobacco Counseling Counseling given: Not Answered   Activities of Daily Living In your present state of health, do you have any difficulty performing the following activities: 04/25/2017  Hearing? N  Vision? N  Difficulty concentrating or making decisions? Y  Walking or climbing stairs? Y  Dressing or bathing? N  Doing errands,  shopping? Y  Preparing Food and eating ? Y  Using the Toilet? Y  In the past six months, have you accidently leaked urine? Y  Do you have problems with loss of bowel control? Y  Managing your Medications? Y  Managing your Finances? Y  Housekeeping or managing your Housekeeping? Y  Some recent data might be hidden    Immunizations and Health Maintenance Immunization History  Administered Date(s) Administered  . Influenza-Unspecified 05/23/2014, 05/18/2016  . Pneumococcal-Unspecified 05/25/2016   Health Maintenance Due  Topic Date Due  . DEXA SCAN  04/30/1988    Patient Care Team: [name], MD as PCP - General (Internal Medicine)  Indicate any recent Medical Services you may have received from other than [facility] providers in the past year (date may be approximate).     Assessment:   This is a routine wellness examination for [facility].   Hearing/Vision screen No exam data present  Dietary issues and exercise activities discussed: Current Exercise Habits: The patient does not participate in regular exercise at present, Exercise limited by: orthopedic condition(s)  Goals    None     Depression Screen PHQ 2/9 Scores 04/25/2017  PHQ - 2 Score 0    Fall Risk Fall Risk  04/25/2017  Falls in the past year? No    Cognitive Function:     6CIT Screen  04/25/2017  What Year? 4 points  What month? 0 points  What time? 0 points  Count back from 20 0 points  Months in reverse 0 points  Repeat phrase 10 points  Total Score 14    Screening Tests Health Maintenance  Topic Date Due  . DEXA SCAN  04/30/1988  . INFLUENZA VACCINE  05/18/2017 (Originally 03/16/2017)  . TETANUS/TDAP  02/10/2026 (Originally 04/30/1940)  . PNA vac Low Risk Adult (2 of 2 - PCV13) 05/25/2017      Plan:    I have personally reviewed and addressed the Medicare Annual Wellness questionnaire and have noted the following in the patient's chart:  A. Medical and social history B. Use of alcohol,  tobacco or illicit drugs  C. Current medications and supplements D. Functional ability and status E.  Nutritional status F.  Physical activity G. Advance directives H. List of other physicians I.  Hospitalizations, surgeries, and ER visits in previous 12 months J.  [place] to include hearing, vision, cognitive, depression L. Referrals and appointments - none  In addition, I have reviewed and discussed with patient certain preventive protocols, quality metrics, and best practice recommendations. A written personalized care plan for preventive services as well as general preventive health recommendations were provided to patient.  See attached scanned questionnaire for additional information.   Signed,   Rich Reining, RN Nurse Health Advisor   Quick Notes   Health Maintenance: TDAP and DEXA due     Abnormal Screen: 6 CIT-14     Patient Concerns: None     Nurse Concerns: NOne

## 2017-04-25 NOTE — Patient Instructions (Signed)
Morgan [name] , Thank you for taking time to come for your Medicare Wellness Visit. I appreciate your ongoing commitment to your health goals. Please review the following plan we discussed and let me know if I can assist you in the future.   Screening recommendations/referrals: Colonoscopy excluded, pt over age 81 Mammogram excluded, pt over age 63 Bone Density due Recommended yearly ophthalmology/optometry visit for glaucoma screening and checkup Recommended yearly dental visit for hygiene and checkup  Vaccinations: Influenza vaccine due Pneumococcal vaccine up to date. Prevnar due 05/25/17 Tdap vaccine due Shingles vaccine not in records   Advanced directives: Need a copy for chart   Conditions/risks identified: None  Next appointment: Dr. Lyndel Safe makes rounds   Preventive Care 65 Years and Older, Female Preventive care refers to lifestyle choices and visits with your health care provider that can promote health and wellness. What does preventive care include?  A yearly physical exam. This is also called an annual well check.  Dental exams once or twice a year.  Routine eye exams. Ask your health care provider how often you should have your eyes checked.  Personal lifestyle choices, including:  Daily care of your teeth and gums.  Regular physical activity.  Eating a healthy diet.  Avoiding tobacco and drug use.  Limiting alcohol use.  Practicing safe sex.  Taking low-dose aspirin every day.  Taking vitamin and mineral supplements as recommended by your health care provider. What happens during an annual well check? The services and screenings done by your health care provider during your annual well check will depend on your age, overall health, lifestyle risk factors, and family history of disease. Counseling  Your health care provider may ask you questions about your:  Alcohol use.  Tobacco use.  Drug use.  Emotional well-being.  Home and relationship  well-being.  Sexual activity.  Eating habits.  History of falls.  Memory and ability to understand (cognition).  Work and work Statistician.  Reproductive health. Screening  You may have the following tests or measurements:  Height, weight, and BMI.  Blood pressure.  Lipid and cholesterol levels. These may be checked every 5 years, or more frequently if you are over 16 years old.  Skin check.  Lung cancer screening. You may have this screening every year starting at age 15 if you have a 30-pack-year history of smoking and currently smoke or have quit within the past 15 years.  Fecal occult blood test (FOBT) of the stool. You may have this test every year starting at age 75.  Flexible sigmoidoscopy or colonoscopy. You may have a sigmoidoscopy every 5 years or a colonoscopy every 10 years starting at age 88.  Hepatitis C blood test.  Hepatitis B blood test.  Sexually transmitted disease (STD) testing.  Diabetes screening. This is done by checking your blood sugar (glucose) after you have not eaten for a while (fasting). You may have this done every 1-3 years.  Bone density scan. This is done to screen for osteoporosis. You may have this done starting at age 66.  Mammogram. This may be done every 1-2 years. Talk to your health care provider about how often you should have regular mammograms. Talk with your health care provider about your test results, treatment options, and if necessary, the need for more tests. Vaccines  Your health care provider may recommend certain vaccines, such as:  Influenza vaccine. This is recommended every year.  Tetanus, diphtheria, and acellular pertussis (Tdap, Td) vaccine. You may need a  Td booster every 10 years.  Zoster vaccine. You may need this after age 86.  Pneumococcal 13-valent conjugate (PCV13) vaccine. One dose is recommended after age 70.  Pneumococcal polysaccharide (PPSV23) vaccine. One dose is recommended after age  69. Talk to your health care provider about which screenings and vaccines you need and how often you need them. This information is not intended to replace advice given to you by your health care provider. Make sure you discuss any questions you have with your health care provider. Document Released: 08/29/2015 Document Revised: 04/21/2016 Document Reviewed: 06/03/2015 Elsevier Interactive Patient Education  2017 [place] Prevention in the Home Falls can cause injuries. They can happen to people of all ages. There are many things you can do to make your home safe and to help prevent falls. What can I do on the outside of my home?  Regularly fix the edges of walkways and driveways and fix any cracks.  Remove anything that might make you trip as you walk through a door, such as a raised step or threshold.  Trim any bushes or trees on the path to your home.  Use bright outdoor lighting.  Clear any walking paths of anything that might make someone trip, such as rocks or tools.  Regularly check to see if handrails are loose or broken. Make sure that both sides of any steps have handrails.  Any raised decks and porches should have guardrails on the edges.  Have any leaves, snow, or ice cleared regularly.  Use sand or salt on walking paths during winter.  Clean up any spills in your garage right away. This includes oil or grease spills. What can I do in the bathroom?  Use night lights.  Install grab bars by the toilet and in the tub and shower. Do not use towel bars as grab bars.  Use non-skid mats or decals in the tub or shower.  If you need to sit down in the shower, use a plastic, non-slip stool.  Keep the floor dry. Clean up any water that spills on the floor as soon as it happens.  Remove soap buildup in the tub or shower regularly.  Attach bath mats securely with double-sided non-slip rug tape.  Do not have throw rugs and other things on the floor that can make  you trip. What can I do in the bedroom?  Use night lights.  Make sure that you have a light by your bed that is easy to reach.  Do not use any sheets or blankets that are too big for your bed. They should not hang down onto the floor.  Have a firm chair that has side arms. You can use this for support while you get dressed.  Do not have throw rugs and other things on the floor that can make you trip. What can I do in the kitchen?  Clean up any spills right away.  Avoid walking on wet floors.  Keep items that you use a lot in easy-to-reach places.  If you need to reach something above you, use a strong step stool that has a grab bar.  Keep electrical cords out of the way.  Do not use floor polish or wax that makes floors slippery. If you must use wax, use non-skid floor wax.  Do not have throw rugs and other things on the floor that can make you trip. What can I do with my stairs?  Do not leave any items on the stairs.  Make sure that there are handrails on both sides of the stairs and use them. Fix handrails that are broken or loose. Make sure that handrails are as long as the stairways.  Check any carpeting to make sure that it is firmly attached to the stairs. Fix any carpet that is loose or worn.  Avoid having throw rugs at the top or bottom of the stairs. If you do have throw rugs, attach them to the floor with carpet tape.  Make sure that you have a light switch at the top of the stairs and the bottom of the stairs. If you do not have them, ask someone to add them for you. What else can I do to help prevent falls?  Wear shoes that:  Do not have high heels.  Have rubber bottoms.  Are comfortable and fit you well.  Are closed at the toe. Do not wear sandals.  If you use a stepladder:  Make sure that it is fully opened. Do not climb a closed stepladder.  Make sure that both sides of the stepladder are locked into place.  Ask someone to hold it for you, if  possible.  Clearly mark and make sure that you can see:  Any grab bars or handrails.  First and last steps.  Where the edge of each step is.  Use tools that help you move around (mobility aids) if they are needed. These include:  Canes.  Walkers.  Scooters.  Crutches.  Turn on the lights when you go into a dark area. Replace any light bulbs as soon as they burn out.  Set up your furniture so you have a clear path. Avoid moving your furniture around.  If any of your floors are uneven, fix them.  If there are any pets around you, be aware of where they are.  Review your medicines with your doctor. Some medicines can make you feel dizzy. This can increase your chance of falling. Ask your doctor what other things that you can do to help prevent falls. This information is not intended to replace advice given to you by your health care provider. Make sure you discuss any questions you have with your health care provider. Document Released: 05/29/2009 Document Revised: 01/08/2016 Document Reviewed: 09/06/2014 Elsevier Interactive Patient Education  2017 [employer].

## 2017-05-09 ENCOUNTER — Non-Acute Institutional Stay (SKILLED_NURSING_FACILITY): Payer: Medicare Other | Admitting: Internal Medicine

## 2017-05-09 ENCOUNTER — Encounter: Payer: Self-pay | Admitting: Internal Medicine

## 2017-05-09 DIAGNOSIS — D631 Anemia in chronic kidney disease: Secondary | ICD-10-CM

## 2017-05-09 DIAGNOSIS — K219 Gastro-esophageal reflux disease without esophagitis: Secondary | ICD-10-CM

## 2017-05-09 DIAGNOSIS — N183 Chronic kidney disease, stage 3 unspecified: Secondary | ICD-10-CM

## 2017-05-09 DIAGNOSIS — F0390 Unspecified dementia without behavioral disturbance: Secondary | ICD-10-CM

## 2017-05-09 DIAGNOSIS — M810 Age-related osteoporosis without current pathological fracture: Secondary | ICD-10-CM | POA: Diagnosis not present

## 2017-05-09 DIAGNOSIS — I10 Essential (primary) hypertension: Secondary | ICD-10-CM

## 2017-05-09 NOTE — Progress Notes (Signed)
Location:   [facility] Room Number: 109/W Place of Service:  SNF [phone]) Provider:  Kyung Rudd, Rene Kocher, MD  Patient Care Team: [name], MD as PCP - General (Internal Medicine)  Extended Emergency Contact Information Primary Emergency Contact: [name] Address: [address]          [place], [place] [address] [place] Phone: [phone] Relation: Other Secondary Emergency Contact: [name], [place] [address] [name] of [place] Phone: [phone] Mobile Phone: [phone] Relation: Niece  Code Status:  Full Code Goals of care: Advanced Directive information Advanced Directives 05/09/2017  Does Patient Have a Medical Advance Directive? Yes  Type of Advance Directive (No Data)  Does patient want to make changes to medical advance directive? No - Patient declined  Copy of [place] in Chart? -  Pre-existing out of facility DNR order (yellow form or pink MOST form) -     Chief Complaint  Patient presents with  . Medical Management of Chronic Issues    Routine Visit    HPI:  Pt is a 83 y.o. female seen today for medical management of chronic diseases.   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  Patient is Pleasantly Demented. She moves around in her Wheel chair.she denies any complains. Her weight was slightly down to 107 lbs. But per nurses she has been eating and has no New issues.  Past Medical History:  Diagnosis Date  . Anemia   . Anxiety   . Dementia   . Dysphagia, oropharyngeal phase   . GERD (gastroesophageal reflux disease)   . Hyperpotassemia   . Hyperpotassemia   . Hypertension   . Other severe protein-calorie malnutrition    Past Surgical History:  Procedure Laterality Date  . ABDOMINAL HYSTERECTOMY    . APPENDECTOMY       No Known Allergies  Outpatient Encounter Prescriptions as of 05/09/2017  Medication Sig  . amLODipine (NORVASC) 5 MG tablet Take 5 mg by mouth daily. For HTN  . labetalol (NORMODYNE) 200 MG tablet Take 200 mg by mouth 2 (two) times daily. For HTN  . ranitidine (ZANTAC) 150 MG tablet Take 150 mg by mouth daily.  . sodium polystyrene (KAYEXALATE) 15 GM/60ML suspension Take 15 g by mouth once. Once a day on Sun,Tue,Thu   No facility-administered encounter medications on file as of 05/09/2017.      Review of Systems  Unable to perform ROS: Dementia    Immunization History  Administered Date(s) Administered  . Influenza-Unspecified 05/23/2014, 05/18/2016  . Pneumococcal-Unspecified 05/25/2016   Pertinent  Health Maintenance Due  Topic Date Due  . INFLUENZA VACCINE  05/18/2017 (Originally 03/16/2017)  . PNA vac Low Risk Adult (2 of 2 - PCV13) 05/25/2017  . DEXA SCAN  Completed   Fall Risk  04/25/2017  Falls in the past year? No   Functional Status Survey:    Vitals:   05/09/17 1056  BP: 136/72  Pulse: 80  Resp: 17  Temp: 97.6 F (36.4 C)  TempSrc: Oral  SpO2: 94%  Weight: 107 lb 12.8 oz (48.9 kg)  Height: 5' 5.5" (1.664 m)   Body mass index is 17.67 kg/m. Physical Exam  Constitutional: She appears well-developed and well-nourished.  HENT:  Head: Normocephalic.  Poor dental hygiene  Eyes: Pupils are equal, round, and reactive  to light.  Neck: Neck supple.  Cardiovascular: Normal rate and normal heart sounds.   No murmur heard. Pulmonary/Chest: Effort normal and breath sounds normal. No respiratory distress. She has no wheezes. She has no rales.  Abdominal: Soft. Bowel sounds are normal. She exhibits no distension. There is no tenderness.  Musculoskeletal: She exhibits no edema.  Neurological: She is alert.  Skin: Skin is warm and dry.  Psychiatric: She has a normal mood and affect. Her behavior is normal.    Labs reviewed:  Recent Labs  11/23/16 0700  01/06/17 0729 03/17/17 0708  NA 137 137 136  K 4.6 4.4 4.2  CL 104 105 104  CO2 25 25 25   GLUCOSE 87 84 86  BUN 39* 30* 21*  CREATININE 1.36* 1.32* 1.10*  CALCIUM 9.2 9.1 9.3    Recent Labs  01/06/17 0729  AST 19  ALT 8*  ALKPHOS 77  BILITOT 0.6  PROT 6.5  ALBUMIN 3.5    Recent Labs  01/06/17 0729 03/17/17 0708 03/24/17 0415 03/31/17 0800  WBC 5.6 4.8 6.0 4.6  NEUTROABS 3.1  --  3.0 2.4  HGB 9.4* 8.8* 8.9* 8.9*  HCT 27.8* 27.2* 26.8* 27.4*  MCV 97.2 100.4* 97.8 99.6  PLT 261 207 204 220   Lab Results  Component Value Date   TSH 2.867 01/06/2017   No results found for: HGBA1C No results found for: CHOL, HDL, LDLCALC, LDLDIRECT, TRIG, CHOLHDL  Significant Diagnostic Results in last 30 days:  No results found.  Assessment/Plan  Essential hypertension Stable on Labetolol  Age-related osteoporosis  Her T score is less patient need treatment but with her loose teeth and will get Dental consult .   GERD Continue on Zantac  Dementia Doing well Stage 3 chronic kidney disease Creat Stable Hyperkalemia Continue Kayexalate  Anemia in stage 3 chronic kidney disease Hgb Stable. Will start her on Low dose of iron supplement  Dental Consult for Gingivitis.  Family/ staff Communication:   Labs/tests ordered:

## 2017-05-10 DIAGNOSIS — M810 Age-related osteoporosis without current pathological fracture: Secondary | ICD-10-CM | POA: Insufficient documentation

## 2017-06-06 ENCOUNTER — Non-Acute Institutional Stay (SKILLED_NURSING_FACILITY): Payer: Medicare Other | Admitting: Internal Medicine

## 2017-06-06 ENCOUNTER — Encounter: Payer: Self-pay | Admitting: Internal Medicine

## 2017-06-06 DIAGNOSIS — I10 Essential (primary) hypertension: Secondary | ICD-10-CM

## 2017-06-06 DIAGNOSIS — D631 Anemia in chronic kidney disease: Secondary | ICD-10-CM

## 2017-06-06 DIAGNOSIS — N183 Chronic kidney disease, stage 3 unspecified: Secondary | ICD-10-CM

## 2017-06-06 DIAGNOSIS — E875 Hyperkalemia: Secondary | ICD-10-CM

## 2017-06-06 DIAGNOSIS — F0390 Unspecified dementia without behavioral disturbance: Secondary | ICD-10-CM

## 2017-06-06 NOTE — Progress Notes (Signed)
Location:   [facility] Room Number: 109/W Place of Service:  SNF [phone]) Provider:  Gardiner Fanti, Rene Kocher, MD  Patient Care Team: [name], MD as PCP - General (Internal Medicine)  Extended Emergency Contact Information Primary Emergency Contact: [name] Address: [address]          [place], [place] [address] [place] Phone: [phone] Relation: Other Secondary Emergency Contact: [name], [place] [address] [name] of [place] Phone: [phone] Mobile Phone: [phone] Relation: Niece  Code Status:  Full Code Goals of care: Advanced Directive information Advanced Directives 06/06/2017  Does Patient Have a Medical Advance Directive? Yes  Type of Advance Directive (No Data)  Does patient want to make changes to medical advance directive? No - Patient declined  Copy of [place] in Chart? No - copy requested  Pre-existing out of facility DNR order (yellow form or pink MOST form) -     Chief Complaint  Patient presents with  . Medical Management of Chronic Issues    Routine Visit   For medical management of chronic medical conditions including dementia-history of renal insufficiency with hyperkalemia-anemia of chronic disease-GERD-hypertension HPI:  Pt is a 83 y.o. female seen today for medical management of chronic diseases. As noted above.  She continues to be quite stable.  Nursing staff does not report any issues her weight appears to be relatively stable at 108 pounds apparently has had fairly decent appetite.  She does have a history of dementia but continues to ablate about facility in her wheelchair and a pleasant manner --apparently at night she gets somewhat more confused and at times agitated   Regards to renal insufficiency creatinine recently 1.1 actually appears somewhat better than her baseline she is on Kayexalate with a history of hyperkalemia  last potassium was 4.2 on lab in early August we will update this.  She also has a history of anemia most likely chronic disease with her renal issues-last hemoglobin was 8.9 which appears relatively baseline-per review Dr. Steve Rattler note there was some suggestion of possibly starting her on low dose iron and will do this.  She also has a history of hypertension she is on labetalol and Norvasc blood pressure manually today was 158/82 I previous listed readings 149/87-121/67 in the past she has not really had consistent elevations although at times will have some spikes at this point will order blood pressure checks every day and review those.  Currently she has no complaints continues to ambulate about facility in her wheelchair she actually has a newspaper in her hand and says she plan to repeat that-from what I can tell she was able to read some of the paper although not sure how much she truly comprehended--  Of note in regards to osteoporosis she does have ahigh T score and Dr. Lyndel Safe per her assessment thought she would need therapy but was awaiting a dental consult which has been completed-      Past Medical History:  Diagnosis Date  . Anemia   . Anxiety   . Dementia   . Dysphagia, oropharyngeal phase   . GERD (gastroesophageal reflux disease)   . Hyperpotassemia   . Hyperpotassemia   . Hypertension   . Other severe protein-calorie malnutrition    Past Surgical History:  Procedure Laterality Date  . ABDOMINAL HYSTERECTOMY    . APPENDECTOMY      No Known  Allergies  Outpatient Encounter Prescriptions as of 06/06/2017  Medication Sig  . amLODipine (NORVASC) 5 MG tablet Take 5 mg by mouth daily. For HTN  . labetalol (NORMODYNE) 200 MG tablet Take 200 mg by mouth 2 (two) times daily. For HTN  . ranitidine (ZANTAC) 150 MG tablet Take 150 mg by mouth daily.  . sodium polystyrene (KAYEXALATE) 15 GM/60ML suspension Take 15 g by mouth once. Once a day on Sun,Tue,Thu   No  facility-administered encounter medications on file as of 06/06/2017.      Review of Systems   Limited secondary to dementia.  In general she does not complain of fever chills weight appears to be relatively stabilized.  Skin is not complaining of any rashes or itching.  Eyes has prescription lenses does not complaining of any visual changes appeared able to read large print the newspaper without difficulty.  Throat is not complaining of any sore throat.  Respiratory does not complain of shortness breath or cough.  Cardiac denies chest pain does not appear to have significant lower extremity edema.  GI is not complaining of any abdominal discomfort says she eats pretty well.  Did not complain of any nausea vomiting diarrhea or constipation.  GU does not complain of dysuria.  Muscle skeletal is not complaining of any joint pain at this time.  Neurologic denies dizziness syncope or headache.  Psych does have a history of dementia but continues to be pleasantly confused apparently she have some nighttime agitation at times   and apparently can be fairly easily redirected Immunization History  Administered Date(s) Administered  . Influenza-Unspecified 05/23/2014, 05/18/2016, 05/20/2017  . Pneumococcal Conjugate-13 11/07/2015  . Pneumococcal-Unspecified 05/25/2016  . Tdap 04/28/2017   Pertinent  Health Maintenance Due  Topic Date Due  . INFLUENZA VACCINE  Completed  . DEXA SCAN  Completed  . PNA vac Low Risk Adult  Completed   Fall Risk  04/25/2017  Falls in the past year? No   Functional Status Survey:    Vitals:   06/06/17 1516  BP: (!) 149/87  Pulse: 71  Resp: 18  Temp: (!) 97 F (36.1 C)  TempSrc: Oral  SpO2: 94%  Weight: 108 lb (49 kg)  Height: 5' 5.5" (1.664 m)  Of note manual blood pressure was 158/82 Body mass index is 17.7 kg/m.   Physical Exam  In general this is a pleasant elderly female in no distress ambulating in her wheelchair.  Her skin  is warm and dry.  Eyes she has prescription lenses sclera and conjunctiva appear relatively clear visual acuity appears grossly intact.  Her oropharynx is clear mucous membranes moist she has numerous extractions.  Chest is clear to auscultation there is no labored breathing.  Heart is regular rate and rhythm without murmur gallop or rub.  She does not have significant lower extremity edema.  Her abdomen soft nontender with positive bowel sounds.  Muscle skeletal moves all her extremities at baseline ambulates without difficulty and her wheelchair about the facility has arthritic changes up or her lower extremities.  Neurologic is grossly intact her speech is clear no lateralizing findings strength appears preserved again ambulates quite well in her wheelchair.  Psych she is oriented to self pleasant and appropriate is able to read newspaper although again not sure of how much she truly comprehends-she has no difficulty following simple verbal commands.    Labs reviewed:  Recent Labs  11/23/16 0700 01/06/17 0729 03/17/17 0708  NA 137 137 136  K 4.6 4.4  4.2  CL 104 105 104  CO2 25 25 25   GLUCOSE 87 84 86  BUN 39* 30* 21*  CREATININE 1.36* 1.32* 1.10*  CALCIUM 9.2 9.1 9.3    Recent Labs  01/06/17 0729  AST 19  ALT 8*  ALKPHOS 77  BILITOT 0.6  PROT 6.5  ALBUMIN 3.5    Recent Labs  01/06/17 0729 03/17/17 0708 03/24/17 0415 03/31/17 0800  WBC 5.6 4.8 6.0 4.6  NEUTROABS 3.1  --  3.0 2.4  HGB 9.4* 8.8* 8.9* 8.9*  HCT 27.8* 27.2* 26.8* 27.4*  MCV 97.2 100.4* 97.8 99.6  PLT 261 207 204 220   Lab Results  Component Value Date   TSH 2.867 01/06/2017   No results found for: HGBA1C No results found for: CHOL, HDL, LDLCALC, LDLDIRECT, TRIG, CHOLHDL  Significant Diagnostic Results in last 30 days:  No results found.  Assessment/Plan  1-hypertension-she continues on Norvasc and labetalol-systolic is somewhat elevated today she at times does have this will  order blood pressure checks daily with a log for review next week this point continue current medications-again there is variability here and has not had consistent elevations in the past.  #2 history of chronic kidney disease with hyperkalemia this appears relatively baseline creatinine 1.1 BUN of 21-this actually somewhat improved from previous labs-potassium was 4.2 she is on Kayexalate with a history of hyperkalemia-we will update this to assure stability of her potassium.  #3-history of anemia most likely chronic disease -- will start her on low-dose iron and update a CBC as well as to keep an eye on her hemoglobin    #4-history of GERD this appears to be stable on Zantac.  #5 history of dementia she appears to be doing very well with supportive care weight is relatively stable nursing staff does not report any acute issues apparently has some nighttime agitation which is of short duration.  #6 osteoporosis-as noted above will discuss options with Dr. Lyndel Safe   [phone]

## 2017-06-07 ENCOUNTER — Encounter (HOSPITAL_COMMUNITY)
Admission: RE | Admit: 2017-06-07 | Discharge: 2017-06-07 | Disposition: A | Discharge status: Home / Self Care | Payer: Medicare Other | Source: Skilled Nursing Facility | Attending: Pediatrics | Admitting: Pediatrics

## 2017-06-07 DIAGNOSIS — I10 Essential (primary) hypertension: Secondary | ICD-10-CM | POA: Diagnosis present

## 2017-06-07 DIAGNOSIS — N189 Chronic kidney disease, unspecified: Secondary | ICD-10-CM | POA: Insufficient documentation

## 2017-06-07 DIAGNOSIS — R279 Unspecified lack of coordination: Secondary | ICD-10-CM | POA: Diagnosis present

## 2017-06-07 DIAGNOSIS — M6281 Muscle weakness (generalized): Secondary | ICD-10-CM | POA: Diagnosis present

## 2017-06-07 DIAGNOSIS — K219 Gastro-esophageal reflux disease without esophagitis: Secondary | ICD-10-CM | POA: Insufficient documentation

## 2017-06-07 LAB — CBC
HEMATOCRIT: 27.3 % — AB (ref 36.0–46.0)
HEMOGLOBIN: 9 g/dL — AB (ref 12.0–15.0)
MCH: 32.3 pg (ref 26.0–34.0)
MCHC: 33 g/dL (ref 30.0–36.0)
MCV: 97.8 fL (ref 78.0–100.0)
Platelets: 217 10*3/uL (ref 150–400)
RBC: 2.79 MIL/uL — AB (ref 3.87–5.11)
RDW: 13.6 % (ref 11.5–15.5)
WBC: 4 10*3/uL (ref 4.0–10.5)

## 2017-06-07 LAB — BASIC METABOLIC PANEL
ANION GAP: 6 (ref 5–15)
BUN: 31 mg/dL — ABNORMAL HIGH (ref 6–20)
CALCIUM: 9.4 mg/dL (ref 8.9–10.3)
CHLORIDE: 106 mmol/L (ref 101–111)
CO2: 24 mmol/L (ref 22–32)
Creatinine, Ser: 1.27 mg/dL — ABNORMAL HIGH (ref 0.44–1.00)
GFR calc non Af Amer: 35 mL/min — ABNORMAL LOW (ref >60)
GFR, EST AFRICAN AMERICAN: 41 mL/min — AB (ref >60)
Glucose, Bld: 85 mg/dL (ref 65–99)
POTASSIUM: 5 mmol/L (ref 3.5–5.1)
Sodium: 136 mmol/L (ref 135–145)

## 2017-06-13 ENCOUNTER — Non-Acute Institutional Stay (SKILLED_NURSING_FACILITY): Payer: Medicare Other | Admitting: Internal Medicine

## 2017-06-13 ENCOUNTER — Encounter (HOSPITAL_COMMUNITY)
Admission: RE | Admit: 2017-06-13 | Discharge: 2017-06-13 | Disposition: A | Discharge status: Home / Self Care | Payer: Medicare Other | Source: Skilled Nursing Facility | Attending: *Deleted | Admitting: *Deleted

## 2017-06-13 ENCOUNTER — Encounter: Payer: Self-pay | Admitting: Internal Medicine

## 2017-06-13 DIAGNOSIS — N183 Chronic kidney disease, stage 3 unspecified: Secondary | ICD-10-CM

## 2017-06-13 DIAGNOSIS — R112 Nausea with vomiting, unspecified: Secondary | ICD-10-CM | POA: Diagnosis not present

## 2017-06-13 DIAGNOSIS — F0390 Unspecified dementia without behavioral disturbance: Secondary | ICD-10-CM | POA: Diagnosis not present

## 2017-06-13 DIAGNOSIS — I10 Essential (primary) hypertension: Secondary | ICD-10-CM | POA: Diagnosis not present

## 2017-06-13 LAB — BASIC METABOLIC PANEL
ANION GAP: 8 (ref 5–15)
BUN: 32 mg/dL — AB (ref 6–20)
CO2: 26 mmol/L (ref 22–32)
Calcium: 9.8 mg/dL (ref 8.9–10.3)
Chloride: 104 mmol/L (ref 101–111)
Creatinine, Ser: 1.21 mg/dL — ABNORMAL HIGH (ref 0.44–1.00)
GFR, EST AFRICAN AMERICAN: 43 mL/min — AB (ref >60)
GFR, EST NON AFRICAN AMERICAN: 37 mL/min — AB (ref >60)
Glucose, Bld: 98 mg/dL (ref 65–99)
Potassium: 4.6 mmol/L (ref 3.5–5.1)
Sodium: 138 mmol/L (ref 135–145)

## 2017-06-13 LAB — HEPATIC FUNCTION PANEL
ALBUMIN: 4 g/dL (ref 3.5–5.0)
ALT: 12 U/L — ABNORMAL LOW (ref 14–54)
AST: 22 U/L (ref 15–41)
Alkaline Phosphatase: 86 U/L (ref 38–126)
BILIRUBIN TOTAL: 0.5 mg/dL (ref 0.3–1.2)
Bilirubin, Direct: <0.1 mg/dL — ABNORMAL LOW (ref 0.1–0.5)
TOTAL PROTEIN: 7.6 g/dL (ref 6.5–8.1)

## 2017-06-13 LAB — CBC WITH DIFFERENTIAL/PLATELET
BASOS PCT: 0 %
Basophils Absolute: 0 10*3/uL (ref 0.0–0.1)
EOS PCT: 3 %
Eosinophils Absolute: 0.2 10*3/uL (ref 0.0–0.7)
HCT: 32.2 % — ABNORMAL LOW (ref 36.0–46.0)
Hemoglobin: 10.5 g/dL — ABNORMAL LOW (ref 12.0–15.0)
Lymphocytes Relative: 21 %
Lymphs Abs: 1.1 10*3/uL (ref 0.7–4.0)
MCH: 32.3 pg (ref 26.0–34.0)
MCHC: 32.6 g/dL (ref 30.0–36.0)
MCV: 99.1 fL (ref 78.0–100.0)
MONO ABS: 0.3 10*3/uL (ref 0.1–1.0)
MONOS PCT: 5 %
Neutro Abs: 3.7 10*3/uL (ref 1.7–7.7)
Neutrophils Relative %: 71 %
Platelets: 221 10*3/uL (ref 150–400)
RBC: 3.25 MIL/uL — ABNORMAL LOW (ref 3.87–5.11)
RDW: 13.7 % (ref 11.5–15.5)
WBC: 5.3 10*3/uL (ref 4.0–10.5)

## 2017-06-13 LAB — LIPASE, BLOOD: Lipase: 25 U/L (ref 11–51)

## 2017-06-13 NOTE — Progress Notes (Signed)
Location:   [name] Room Number: 109/W Place of Service:  SNF [phone]) Provider:  Kyung Rudd, Rene Kocher, MD  Patient Care Team: [name], MD as PCP - General (Internal Medicine)  Extended Emergency Contact Information Primary Emergency Contact: [place],[name] Address: [address]          [place], [place] [address] [place] Phone: [phone] Relation: Other Secondary Emergency Contact: [name], [place] [address] [name] of [place] Phone: [phone] Mobile Phone: [phone] Relation: Niece  Code Status:  Full Code Goals of care: Advanced Directive information Advanced Directives 06/13/2017  Does Patient Have a Medical Advance Directive? Yes  Type of Advance Directive (No Data)  Does patient want to make changes to medical advance directive? No - Patient declined  Copy of [place] in Chart? No - copy requested  Pre-existing out of facility DNR order (yellow form or pink MOST form) -     Chief Complaint  Patient presents with  . Acute Visit    Vomiting    HPI:  Pt is a 83 y.o. Morgan [name] [name] seen today for an acute visit for Vomiting.  Patient has h/o Hyperkalemia secondary to Chronic renal insufficiency, hypertension, anemia secondary to renal diseaseAnd dementia without any behavior issues.  She started C/O not feeling good this morning and then she threw up after eating her Breakfast. Patient now in bed. She says her Head feels Funny. Hard to get detail history from her due to her dementia. She keeps saying she fell many times today but per nurses she did not fall. She says her Nausea is better now but she still does not feel like eating.Denies abdominal Pain. No Fever or Chills.  Past Medical History:  Diagnosis Date  . Anemia   . Anxiety   . Dementia   . Dysphagia, oropharyngeal phase   . GERD (gastroesophageal reflux disease)   . Hyperpotassemia   .  Hyperpotassemia   . Hypertension   . Other severe protein-calorie malnutrition    Past Surgical History:  Procedure Laterality Date  . ABDOMINAL HYSTERECTOMY    . APPENDECTOMY      No Known Allergies  Outpatient Encounter Prescriptions as of 06/13/2017  Medication Sig  . amLODipine (NORVASC) 5 MG tablet Take 5 mg by mouth daily. For HTN  . Ferrous Sulfate (IRON) 325 (65 Fe) MG TABS Take 1 tablet by mouth once a day  . labetalol (NORMODYNE) 200 MG tablet Take 200 mg by mouth 2 (two) times daily. For HTN  . ranitidine (ZANTAC) 150 MG tablet Take 150 mg by mouth daily.  . sodium polystyrene (KAYEXALATE) 15 GM/60ML suspension Take 15 g by mouth once. Once a day on Sun,Tue,Thu   No facility-administered encounter medications on file as of 06/13/2017.      Review of Systems  Reason unable to perform ROS: Not reliable due to her Dementia.    Immunization History  Administered Date(s) Administered  . Influenza-Unspecified 05/23/2014, 05/18/2016, 05/20/2017  . Pneumococcal Conjugate-13 11/07/2015  . Pneumococcal-Unspecified 05/25/2016  . Tdap 04/28/2017   Pertinent  Health Maintenance Due  Topic Date Due  . INFLUENZA VACCINE  Completed  . DEXA SCAN  Completed  . PNA vac Low Risk Adult  Completed   Fall Risk  04/25/2017  Falls in the past year? No   Functional Status Survey:    Vitals:   06/13/17 0946  BP: 137/75  Pulse: 63  Resp: 18  Temp: 97.6 F (36.4 C)  TempSrc: Oral  SpO2: 95%   There is no height or weight on file to calculate BMI. Physical Exam  Constitutional: She appears well-developed and well-nourished.  HENT:  Head: Normocephalic.  Mouth/Throat: Oropharynx is clear and moist.  Eyes: Pupils are equal, round, and reactive to light.  No Nystagmus  Neck: Neck supple.  Cardiovascular: Normal rate and normal heart sounds.   No murmur heard. Pulmonary/Chest: Breath sounds normal. No respiratory distress. She has no wheezes. She has no rales.  Abdominal:  Soft. Bowel sounds are normal. She exhibits no distension. There is no rebound and no guarding.  Patient was mildly Tender around Midepigastric  Musculoskeletal: She exhibits no edema.  Neurological: She is alert.  No Focal deficits.   Skin: Skin is warm and dry.  Psychiatric: She has a normal mood and affect. Her behavior is normal. Thought content normal.    Labs reviewed:  Recent Labs  01/06/17 0729 03/17/17 0708 06/07/17 0705  NA 137 136 136  K 4.4 4.2 5.0  CL 105 104 106  CO2 25 25 24   GLUCOSE 84 86 Morgan [name]  BUN 30* 21* 31*  CREATININE 1.32* 1.10* 1.27*  CALCIUM 9.1 9.3 9.4    Recent Labs  01/06/17 0729  AST 19  ALT 8*  ALKPHOS 77  BILITOT 0.6  PROT 6.5  ALBUMIN 3.5    Recent Labs  01/06/17 0729  03/24/17 0415 03/31/17 0800 06/07/17 0705  WBC 5.6  < > 6.0 4.6 4.0  NEUTROABS 3.1  --  3.0 2.4  --   HGB 9.4*  < > 8.9* 8.9* 9.0*  HCT 27.8*  < > 26.8* 27.4* 27.3*  MCV 97.2  < > 97.8 99.6 97.8  PLT 261  < > 204 220 217  < > = values in this interval not displayed. Lab Results  Component Value Date   TSH 2.867 01/06/2017   No results found for: HGBA1C No results found for: CHOL, HDL, LDLCALC, LDLDIRECT, TRIG, CHOLHDL  Significant Diagnostic Results in last 30 days:  No results found.  Assessment/Plan  Mild Abdominal Pain with Nausea and Vomiting Will get Stat labs including Hepatic panel, CBC, BMP, And Lipase and UA  Labs reviewed All labs are WNL. Lipase is normal. Awaiting UA Will Continue to keep her on Clear liquids. Vitals More frequently. I went to see patient agin and she says she is feeling better and denies any Nausea    Family/ staff Communication:   Labs/tests ordered:

## 2017-06-14 ENCOUNTER — Encounter (HOSPITAL_COMMUNITY)
Admission: RE | Admit: 2017-06-14 | Discharge: 2017-06-14 | Disposition: A | Discharge status: Home / Self Care | Payer: Medicare Other | Source: Skilled Nursing Facility | Attending: Internal Medicine | Admitting: Internal Medicine

## 2017-06-14 DIAGNOSIS — I10 Essential (primary) hypertension: Secondary | ICD-10-CM | POA: Diagnosis not present

## 2017-06-14 LAB — URINALYSIS, ROUTINE W REFLEX MICROSCOPIC
BILIRUBIN URINE: NEGATIVE
GLUCOSE, UA: NEGATIVE mg/dL
Hgb urine dipstick: NEGATIVE
Ketones, ur: NEGATIVE mg/dL
Nitrite: POSITIVE — AB
PROTEIN: NEGATIVE mg/dL
Specific Gravity, Urine: 1.011 (ref 1.005–1.030)
pH: 6 (ref 5.0–8.0)

## 2017-06-15 ENCOUNTER — Non-Acute Institutional Stay (SKILLED_NURSING_FACILITY): Payer: Medicare Other | Admitting: Internal Medicine

## 2017-06-15 ENCOUNTER — Encounter: Payer: Self-pay | Admitting: Internal Medicine

## 2017-06-15 DIAGNOSIS — N390 Urinary tract infection, site not specified: Secondary | ICD-10-CM | POA: Diagnosis not present

## 2017-06-15 DIAGNOSIS — N183 Chronic kidney disease, stage 3 unspecified: Secondary | ICD-10-CM

## 2017-06-15 NOTE — Progress Notes (Signed)
Location:   [facility] Room Number: 109/W Place of Service:  SNF [phone]) Provider:  Freddi Starr, MD  Patient Care Team: [name], MD as PCP - General (Internal Medicine)  Extended Emergency Contact Information Primary Emergency Contact: [name] Address: [address]          [place], [place] [address] [place] Phone: [phone] Relation: Other Secondary Emergency Contact: [name], [place] [address] [name] of [place] Phone: [phone] Mobile Phone: [phone] Relation: Niece  Code Status:  Full Code Goals of care: Advanced Directive information Advanced Directives 06/15/2017  Does Patient Have a Medical Advance Directive? Yes  Type of Advance Directive (No Data)  Does patient want to make changes to medical advance directive? No - Patient declined  Copy of [place] in Chart? No - copy requested  Pre-existing out of facility DNR order (yellow form or pink MOST form) -     Chief Complaint  Patient presents with  . Acute Visit    UTI    HPI:  Pt is a 83 y.o. female seen today for an acute visit A urine culture which is growing out greater than 100,000 colonies of Escherichia coli   Patient was seen earlier this week by Dr. Lyndel Safe because Stanton Kidney stated she was not feeling well and then threw up after eating her breakfast.  Labs were ordered which actually came back unremarkable and a urine culture was also done on those results have been obtained and appear she has grown out greater than 100,000 colonies of Escherichia coli with sensitivity still pending-  She is not complaining of any further nausea or vomiting apparently is back to her baseline per nursing.  Vital signs appear to be stable  She has numerous medical issues including a history of hyperkalemia secondary to chronic renal insufficiency which has been stable hypertension anemia and  dementia without any behavior issues of significance occasion she will have agitation but this is of short duration and usually in the evening     Past Medical History:  Diagnosis Date  . Anemia   . Anxiety   . Dementia   . Dysphagia, oropharyngeal phase   . GERD (gastroesophageal reflux disease)   . Hyperpotassemia   . Hyperpotassemia   . Hypertension   . Other severe protein-calorie malnutrition    Past Surgical History:  Procedure Laterality Date  . ABDOMINAL HYSTERECTOMY    . APPENDECTOMY      No Known Allergies  Outpatient Encounter Prescriptions as of 06/15/2017  Medication Sig  . amLODipine (NORVASC) 5 MG tablet Take 5 mg by mouth daily. For HTN  . Ferrous Sulfate (IRON) 325 (65 Fe) MG TABS Take 1 tablet by mouth once a day  . labetalol (NORMODYNE) 200 MG tablet Take 200 mg by mouth 2 (two) times daily. For HTN  . ondansetron (ZOFRAN) 4 MG tablet Take 4 mg by mouth every 8 (eight) hours as needed for nausea or vomiting.  . ranitidine (ZANTAC) 150 MG tablet Take 150 mg by mouth daily.  . sodium polystyrene (KAYEXALATE) 15 GM/60ML suspension Take 15 g by mouth once. Once a day on Sun,Tue,Thu   No facility-administered encounter medications on file as of 06/15/2017.     Review of Systems   This is limited secondary to dementia  General does not complaining fever or chills says she feels better today and has  eaten well.  Skin does not complain of any diaphoresis rashes or itching.  Head ears eyes nose mouth and throat no visual changes or difficulty swallowing.  Respiratory does not complain of cough or shortness of breath.  Cardiac no complaints of chest pain or significant lower extremity edema.  GI is not complaining of any vomiting or nausea diarrhea or constipation apparently this was fairly short duration 2 days ago.  GU again has grown out greater than 100,000 colonies of Escherichia coli in her urine does not really specifically complain of burning  however area  Muscle skeletal does not complain of joint pain continues amply about a wheelchair at baseline.  Neurologic is not complaining of dizziness headache or syncope.  Psych continues to be pleasant at times confused but largely appropriate pleasant smiling in good spirits which is the case this evening  Immunization History  Administered Date(s) Administered  . Influenza-Unspecified 05/23/2014, 05/18/2016, 05/20/2017  . Pneumococcal Conjugate-13 11/07/2015  . Pneumococcal-Unspecified 05/25/2016  . Tdap 04/28/2017   Pertinent  Health Maintenance Due  Topic Date Due  . INFLUENZA VACCINE  Completed  . DEXA SCAN  Completed  . PNA vac Low Risk Adult  Completed   Fall Risk  04/25/2017  Falls in the past year? No   Functional Status Survey:    Vitals:   06/15/17 1620  BP: 115/70  Pulse: 78  Resp: 15  Temp: 98.8 F (37.1 C)  TempSrc: Oral  SpO2: 97%    Physical Exam In general this is a very pleasant elderly female in no distress lying comfortably in bed she appears to be good spirits.  Her skin is warm and dry.  Eyes visual acuity appears grossly intact.  Oropharynx is clear mucous membranes moist she does have poor dilatation.  Chest is clear to auscultation no labored breathing.  Heart is regular rate and rhythm without murmur gallop or rub she does not really have significant lower extremity edema.  Abdomen is soft nontender with positive bowel sounds.  Musculoskeletal moves her extremities at baseline continues to ambulate in a wheelchair without difficulty.  Neurologic no focal deficits noted she is alert right in good spirits cranial nerves appear grossly intact.  Psych she is oriented to self pleasant and appropriate can carry on a simple conversation Labs reviewed:  Recent Labs  03/17/17 0708 06/07/17 0705 06/13/17 1520  NA 136 136 138  K 4.2 5.0 4.6  CL 104 106 104  CO2 25 24 26   GLUCOSE 86 85 98  BUN 21* 31* 32*  CREATININE 1.10* 1.27*  1.21*  CALCIUM 9.3 9.4 9.8    Recent Labs  01/06/17 0729 06/13/17 1300  AST 19 22  ALT 8* 12*  ALKPHOS 77 86  BILITOT 0.6 0.5  PROT 6.5 7.6  ALBUMIN 3.5 4.0    Recent Labs  03/24/17 0415 03/31/17 0800 06/07/17 0705 06/13/17 1300  WBC 6.0 4.6 4.0 5.3  NEUTROABS 3.0 2.4  --  3.7  HGB 8.9* 8.9* 9.0* 10.5*  HCT 26.8* 27.4* 27.3* 32.2*  MCV 97.8 99.6 97.8 99.1  PLT 204 220 217 221   Lab Results  Component Value Date   TSH 2.867 01/06/2017   No results found for: HGBA1C No results found for: CHOL, HDL, LDLCALC, LDLDIRECT, TRIG, CHOLHDL  Significant Diagnostic Results in last 30 days:  No results found.  Assessment/Plan  #1-UTI-we did discuss options with the clinical pharmacist in the facility-this is complicated somewhat with her history of renal insufficiency-creatinine clearance is 22-will  empirically start her on cefdinir 300 mg daily based on her renal function-will await final sensitivities-clinically she actually appears to be stable-and doing better again lab work was unremarkable she has baseline renal insufficiency which is not new.  We'll continue Ceftin  for 7 days and add a probiotic twice a day as well--and when final sensitivities arrive will make changes if warranted  She says she's eating well today-will continue to monitor and await sensitivities.  [id number] note greater than 25 minutes spent assessing patient-reviewing her chart-her labs-discussing her status with clinical pharmacist in the facility-and coordinating plan of care-of note greater than 50% of time spent coordinating plan of care with input as noted above

## 2017-06-17 LAB — URINE CULTURE

## 2017-07-12 ENCOUNTER — Encounter: Payer: Self-pay | Admitting: Internal Medicine

## 2017-07-12 ENCOUNTER — Non-Acute Institutional Stay (SKILLED_NURSING_FACILITY): Payer: Medicare Other | Admitting: Internal Medicine

## 2017-07-12 DIAGNOSIS — I10 Essential (primary) hypertension: Secondary | ICD-10-CM

## 2017-07-12 DIAGNOSIS — M810 Age-related osteoporosis without current pathological fracture: Secondary | ICD-10-CM

## 2017-07-12 DIAGNOSIS — K219 Gastro-esophageal reflux disease without esophagitis: Secondary | ICD-10-CM

## 2017-07-12 DIAGNOSIS — F0390 Unspecified dementia without behavioral disturbance: Secondary | ICD-10-CM | POA: Diagnosis not present

## 2017-07-12 DIAGNOSIS — E875 Hyperkalemia: Secondary | ICD-10-CM

## 2017-07-12 DIAGNOSIS — D631 Anemia in chronic kidney disease: Secondary | ICD-10-CM | POA: Diagnosis not present

## 2017-07-12 DIAGNOSIS — N183 Chronic kidney disease, stage 3 unspecified: Secondary | ICD-10-CM

## 2017-07-12 NOTE — Progress Notes (Signed)
Location:   [facility] Room Number: 109/W Place of Service:  SNF [phone]) Provider:  Kyung [name], Morgan Kocher, MD  Patient Care Team: Morgan Dad, MD as PCP - General (Internal Medicine)  Extended Emergency Contact Information Primary Emergency Contact: High,[name] Address: [address]          [place], [place] [address] [place] Phone: [phone] Relation: Other Secondary Emergency Contact: [name], [place] [address] [name] of [place] Phone: [phone] Mobile Phone: [phone] Relation: Niece  Code Status:  Full Code Goals of care: Advanced Directive information Advanced Directives 07/12/2017  Does Patient Have a Medical Advance Directive? Yes  Type of Advance Directive (No Data)  Does patient want to make changes to medical advance directive? No - Patient declined  Copy of [place] in Chart? No - copy requested  Pre-existing out of facility DNR order (yellow form or pink MOST form) -     Chief Complaint  Patient presents with  . Medical Management of Chronic Issues    Routine Visit    HPI:  Pt is a 83 y.o. female seen today for medical management of chronic diseases.    Patient has h/o Hyperkalemia secondary to Chronic renal insufficiency, hypertension, anemia secondary to renal diseaseAnd dementia without any behavior issues.  Patient was seen few weeks ago for Nausea and Vomiting and was found to have UTI. She has been back to baseline since then. Had no new Nursing issues. No new complains. Her weight is 109 lbs which is stable. She is pleasantly demented and wheel around in her chair.  Past Medical History:  Diagnosis Date  . Anemia   . Anxiety   . Dementia   . Dysphagia, oropharyngeal phase   . GERD (gastroesophageal reflux disease)   . Hyperpotassemia   . Hyperpotassemia   . Hypertension   . Other severe protein-calorie malnutrition    Past  Surgical History:  Procedure Laterality Date  . ABDOMINAL HYSTERECTOMY    . APPENDECTOMY      No Known Allergies  Outpatient Encounter Medications as of 07/12/2017  Medication Sig  . amLODipine (NORVASC) 5 MG tablet Take 5 mg by mouth daily. For HTN  . Ferrous Sulfate (IRON) 325 (65 Fe) MG TABS Take 1 tablet by mouth once a day  . labetalol (NORMODYNE) 200 MG tablet Take 200 mg by mouth 2 (two) times daily. For HTN  . ondansetron (ZOFRAN) 4 MG tablet Take 4 mg by mouth every 6 (six) hours as needed for nausea or vomiting.   . ranitidine (ZANTAC) 150 MG tablet Take 150 mg by mouth daily.  . sodium polystyrene (KAYEXALATE) 15 GM/60ML suspension Take 15 g by mouth once. Once a day on Sun,Tue,Thu   No facility-administered encounter medications on file as of 07/12/2017.      Review of Systems  Unable to perform ROS: Dementia    Immunization History  Administered Date(s) Administered  . Influenza-Unspecified 05/23/2014, 05/18/2016, 05/20/2017  . Pneumococcal Conjugate-13 11/07/2015  . Pneumococcal-Unspecified 05/25/2016  . Tdap 04/28/2017   Pertinent  Health Maintenance Due  Topic Date Due  . INFLUENZA VACCINE  Completed  . DEXA SCAN  Completed  . PNA vac Low Risk Adult  Completed   Fall Risk  04/25/2017  Falls in the past year? No   Functional Status Survey:    Vitals:   07/12/17 1155  BP: 125/77  Pulse:  71  Resp: 18  Temp: (!) 97 F (36.1 C)  TempSrc: Oral  SpO2: 98%  Weight: 109 lb (49.4 kg)  Height: 5' 5.5" (1.664 m)   Body mass index is 17.86 kg/m. Physical Exam  Constitutional: She appears well-developed and well-nourished.  HENT:  Head: Normocephalic.  Poor Dental Hygiene.  Neck: Neck supple.  Cardiovascular: Normal rate, regular rhythm and normal heart sounds.  No murmur heard. Pulmonary/Chest: Effort normal and breath sounds normal. No respiratory distress. She has no wheezes.  Abdominal: Soft. Bowel sounds are normal. She exhibits no distension.  There is no tenderness. There is no rebound.  Musculoskeletal: She exhibits no edema.  Lymphadenopathy:    She has no cervical adenopathy.  Neurological: She is alert.  Skin: Skin is warm and dry.  Psychiatric: She has a normal mood and affect. Her behavior is normal.    Labs reviewed: Recent Labs    03/17/17 0708 06/07/17 0705 06/13/17 1520  NA 136 136 138  K 4.2 5.0 4.6  CL 104 106 104  CO2 25 24 26   GLUCOSE 86 85 98  BUN 21* 31* 32*  CREATININE 1.10* 1.27* 1.21*  CALCIUM 9.3 9.4 9.8   Recent Labs    01/06/17 0729 06/13/17 1300  AST 19 22  ALT 8* 12*  ALKPHOS 77 86  BILITOT 0.6 0.5  PROT 6.5 7.6  ALBUMIN 3.5 4.0   Recent Labs    03/24/17 0415 03/31/17 0800 06/07/17 0705 06/13/17 1300  WBC 6.0 4.6 4.0 5.3  NEUTROABS 3.0 2.4  --  3.7  HGB 8.9* 8.9* 9.0* 10.5*  HCT 26.8* 27.4* 27.3* 32.2*  MCV 97.8 99.6 97.8 99.1  PLT 204 220 217 221   Lab Results  Component Value Date   TSH 2.867 01/06/2017   No results found for: HGBA1C No results found for: CHOL, HDL, LDLCALC, LDLDIRECT, TRIG, CHOLHDL  Significant Diagnostic Results in last 30 days:  No results found.  Assessment/Plan  Essential hypertension Patient BP is stable on Amlodipine and Labetalol.  Stage 3 chronic kidney disease With Hyperkalemia Potassium stable on Kayexalate Creat stable  Anemia due to stage 3 chronic kidney disease  On Iron Supplement and Hgb Stable  GERD On Zantac. Dis continue Zofran  Age-related osteoporosis  Patein is getting Dental consult for some teeth extraction Will wait before starting her on treatment. Dementia Stable Family/ staff Communication:   Labs/tests ordered:   Total time spent in this patient care encounter was _25 minutes; greater than 50% of the visit spent counseling patient, reviewing records , Labs and coordinating care for problems addressed at this encounter.

## 2017-08-04 ENCOUNTER — Encounter: Payer: Self-pay | Admitting: Internal Medicine

## 2017-08-04 ENCOUNTER — Non-Acute Institutional Stay (SKILLED_NURSING_FACILITY): Payer: Medicare Other | Admitting: Internal Medicine

## 2017-08-04 DIAGNOSIS — K219 Gastro-esophageal reflux disease without esophagitis: Secondary | ICD-10-CM | POA: Diagnosis not present

## 2017-08-04 DIAGNOSIS — F0390 Unspecified dementia without behavioral disturbance: Secondary | ICD-10-CM | POA: Diagnosis not present

## 2017-08-04 DIAGNOSIS — D631 Anemia in chronic kidney disease: Secondary | ICD-10-CM | POA: Diagnosis not present

## 2017-08-04 DIAGNOSIS — I10 Essential (primary) hypertension: Secondary | ICD-10-CM

## 2017-08-04 DIAGNOSIS — N183 Chronic kidney disease, stage 3 unspecified: Secondary | ICD-10-CM

## 2017-08-04 NOTE — Progress Notes (Signed)
Location:   [facility] Room Number: 109/W Place of Service:  SNF (31) Provider:  Freddi Starr, MD  Patient Care Team: [name], MD as PCP - General (Internal Medicine)  Extended Emergency Contact Information Primary Emergency Contact: [name] Address: [address]          [place], [place] [address] [place] Phone: [phone] Relation: Other Secondary Emergency Contact: [name], [place] [address] [name] of [place] Phone: [phone] Mobile Phone: [phone] Relation: Niece  Code Status:  Full Code Goals of care: Advanced Directive information Advanced Directives 08/04/2017  Does Patient Have a Medical Advance Directive? Yes  Type of Advance Directive (No Data)  Does patient want to make changes to medical advance directive? No - Patient declined  Copy of [place] in Chart? No - copy requested  Pre-existing out of facility DNR order (yellow form or pink MOST form) -     Chief Complaint  Patient presents with  . 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 female seen today for medical management of chronic diseases as noted above she continues to be quite stable-ambulates about facility in her wheelchair generally very amiably at times will get a bit agitated later at night but can be redirected. She is not on any medications for dementia but nonetheless appears to be stable with supportive care.  Her weight is stable at 109 pounds.  She does have a history of hyperkalemia with a history of renal insufficiency but this is been stable she does receive Kayexalate 3 times a week--on lab done in late October potassium was 4.6 BUN was 32 and  creatinine is 1.21 which appears to be relatively baseline  She also has a history of hypertension blood pressure appears to be somewhat variable I see readings ranging from 120/64-124/78 I actually got 150/60 tonight manually but she was somewhat excited and had been wheeling about facility which I suspect elevated her blood pressure  She is on labetalol 200 mg twice a day as well as Norvasc 5 mg a day.  She also has a history of anemia most likely due to renal disease--hemoglobin has shown stability actually was 10.5 on lab done on October 29 which was up from  Around  9 which was her previous baseline.      .     Past Medical History:  Diagnosis Date  . Anemia   . Anxiety   . Dementia   . Dysphagia, oropharyngeal phase   . GERD (gastroesophageal reflux disease)   . Hyperpotassemia   . Hyperpotassemia   . Hypertension   . Other severe protein-calorie malnutrition    Past Surgical History:  Procedure Laterality Date  . ABDOMINAL HYSTERECTOMY    . APPENDECTOMY      No Known Allergies  Outpatient Encounter Medications as of 08/04/2017  Medication Sig  . amLODipine (NORVASC) 5 MG tablet Take 5 mg by mouth daily. For HTN  . Ferrous Sulfate (IRON) 325 (65 Fe) MG TABS Take 1 tablet by mouth once a day  . labetalol (NORMODYNE) 200 MG tablet Take 200 mg by mouth 2 (two) times daily. For HTN  . ranitidine (ZANTAC) 150 MG tablet Take 150 mg by  mouth daily.  . sodium polystyrene (KAYEXALATE) 15 GM/60ML suspension Take 15 g by mouth once. Once a day on Sun,Tue,Thu  . [DISCONTINUED] ondansetron (ZOFRAN) 4 MG tablet Take 4 mg by mouth every 6 (six) hours as needed for nausea or vomiting.    No facility-administered encounter medications on file as of 08/04/2017.      Review of Systems   This is somewhat limited secondary to patient being a poor historian with dementia.  In general she not complaining of any fever chills says she feels well.  Skin is not complaining of rashes  or itching.  Head ears eyes nose mouth and throat does not complain of any visual changes or sore throat or nasal discharge.  Respiratory is not complaining of any cough or shortness of breath.  Cardiac denies chest pain does not appear to have significant lower extremity edema.  GI is not complaining of any abdominal discomfort nausea vomiting diarrhea constipation.  GU is not complaining of dysuria has been treated for UTIs in the past.  Musculoskeletal does not complain of joint pain continues to ambulate about facility does not complain of any discomfort.  Neurologic is not complaining of syncope dizziness or headaches.  And psych history of dementia- any behaviors tend to be later in the evening and apparently she can be easily redirected.    Immunization History  Administered Date(s) Administered  . Influenza-Unspecified 05/23/2014, 05/18/2016, 05/20/2017  . Pneumococcal Conjugate-13 11/07/2015  . Pneumococcal-Unspecified 05/25/2016  . Tdap 04/28/2017   Pertinent  Health Maintenance Due  Topic Date Due  . INFLUENZA VACCINE  Completed  . DEXA SCAN  Completed  . PNA vac Low Risk Adult  Completed   Fall Risk  04/25/2017  Falls in the past year? No   Functional Status Survey:    Vitals:   08/04/17 1619  BP: 124/78  Pulse: 78  Resp: 20  Temp: 98 F (36.7 C)  TempSrc: Oral  SpO2: 99%  Weight: 109 lb 3.2 oz (49.5 kg)  Height: 5' 5.5" (1.664 m)  As noted above systolic blood pressures are somewhat variable [id number] systolically Body mass index is 17.9 kg/m. Physical Exam   In general this is a pleasant elderly female in no distress continues to sit in her wheelchair.  Her skin is warm and dry.  Eyes has prescription lenses visual acuity appears to be intact sclera and conjunctive are continue to be clear.  Oropharynx is clear mucous membranes moist she does have again numerous extractions.  Her chest is clear to auscultation there is no labored  breathing.  Heart is regular rate and rhythm without murmur gallop or rub she does not really have significant lower extremity edema.  Her abdomen continues to be soft nontender she has active bowel sounds.  Musculoskeletal continues to ambulate in wheelchair and does well with this moves her extremities at baseline strength appears to be intact all 4 extremities.  Neurologic is grossly intact no lateralizing findings her speech is clear.  Psych she is oriented to self pleasant and appropriate can carry on a very short straightforward conversation-  Labs reviewed: Recent Labs    03/17/17 0708 06/07/17 0705 06/13/17 1520  NA 136 136 138  K 4.2 5.0 4.6  CL 104 106 104  CO2 25 24 26   GLUCOSE 86 85 98  BUN 21* 31* 32*  CREATININE 1.10* 1.27* 1.21*  CALCIUM 9.3 9.4 9.8   Recent Labs    01/06/17 0729 06/13/17 1300  AST 19 22  ALT 8* 12*  ALKPHOS 77 86  BILITOT 0.6 0.5  PROT 6.5 7.6  ALBUMIN 3.5 4.0   Recent Labs    03/24/17 0415 03/31/17 0800 06/07/17 0705 06/13/17 1300  WBC 6.0 4.6 4.0 5.3  NEUTROABS 3.0 2.4  --  3.7  HGB 8.9* 8.9* 9.0* 10.5*  HCT 26.8* 27.4* 27.3* 32.2*  MCV 97.8 99.6 97.8 99.1  PLT 204 220 217 221   Lab Results  Component Value Date   TSH 2.867 01/06/2017   No results found for: HGBA1C No results found for: CHOL, HDL, LDLCALC, LDLDIRECT, TRIG, CHOLHDL  Significant Diagnostic Results in last 30 days:  No results found.  Assessment/Plan  #1 history of dementia-this appears to be stable currently on no medications she is doing well with supportive care weight has been stable-apparently any behaviors can be easily redirected.  2.  History of chronic kidney disease with hyperkalemia this has been stable now for some time creatinine in the 1 2 area appears to be relatively stable she is on Kayexalate and potassium is been stable for some time will update this since it has been a couple months.  3.-History of anemia most likely secondary to  chronic disease this actually shows improvement at 10.5 on lab in late October again will update this as well she continues on iron.  4.  History of GERD this is stable on Zantac.  5.  Hypertension she is on labetalol and Norvasc-has somewhat variable systolics but I do not see consistent elevations considering her advanced age emphasis is to treat this somewhat conservatively but will monitor.  [id number]

## 2017-08-05 ENCOUNTER — Encounter (HOSPITAL_COMMUNITY)
Admission: RE | Admit: 2017-08-05 | Discharge: 2017-08-05 | Disposition: A | Discharge status: Home / Self Care | Payer: Medicare Other | Source: Skilled Nursing Facility | Attending: Internal Medicine | Admitting: Internal Medicine

## 2017-08-05 DIAGNOSIS — K219 Gastro-esophageal reflux disease without esophagitis: Secondary | ICD-10-CM | POA: Diagnosis present

## 2017-08-05 DIAGNOSIS — N189 Chronic kidney disease, unspecified: Secondary | ICD-10-CM | POA: Insufficient documentation

## 2017-08-05 DIAGNOSIS — D631 Anemia in chronic kidney disease: Secondary | ICD-10-CM | POA: Diagnosis not present

## 2017-08-05 DIAGNOSIS — M6281 Muscle weakness (generalized): Secondary | ICD-10-CM | POA: Insufficient documentation

## 2017-08-05 DIAGNOSIS — R279 Unspecified lack of coordination: Secondary | ICD-10-CM | POA: Insufficient documentation

## 2017-08-05 DIAGNOSIS — I10 Essential (primary) hypertension: Secondary | ICD-10-CM | POA: Insufficient documentation

## 2017-08-05 LAB — CBC WITH DIFFERENTIAL/PLATELET
Basophils Absolute: 0 10*3/uL (ref 0.0–0.1)
Basophils Relative: 1 %
EOS ABS: 0.2 10*3/uL (ref 0.0–0.7)
EOS PCT: 5 %
HCT: 29.5 % — ABNORMAL LOW (ref 36.0–46.0)
Hemoglobin: 9.6 g/dL — ABNORMAL LOW (ref 12.0–15.0)
LYMPHS PCT: 31 %
Lymphs Abs: 1.4 10*3/uL (ref 0.7–4.0)
MCH: 32.2 pg (ref 26.0–34.0)
MCHC: 32.5 g/dL (ref 30.0–36.0)
MCV: 99 fL (ref 78.0–100.0)
MONO ABS: 0.5 10*3/uL (ref 0.1–1.0)
Monocytes Relative: 12 %
Neutro Abs: 2.3 10*3/uL (ref 1.7–7.7)
Neutrophils Relative %: 51 %
PLATELETS: 223 10*3/uL (ref 150–400)
RBC: 2.98 MIL/uL — AB (ref 3.87–5.11)
RDW: 13.8 % (ref 11.5–15.5)
WBC: 4.5 10*3/uL (ref 4.0–10.5)

## 2017-08-05 LAB — BASIC METABOLIC PANEL
Anion gap: 8 (ref 5–15)
BUN: 29 mg/dL — AB (ref 6–20)
CO2: 24 mmol/L (ref 22–32)
CREATININE: 1.18 mg/dL — AB (ref 0.44–1.00)
Calcium: 9.4 mg/dL (ref 8.9–10.3)
Chloride: 106 mmol/L (ref 101–111)
GFR calc Af Amer: 44 mL/min — ABNORMAL LOW (ref >60)
GFR, EST NON AFRICAN AMERICAN: 38 mL/min — AB (ref >60)
GLUCOSE: 92 mg/dL (ref 65–99)
POTASSIUM: 3.7 mmol/L (ref 3.5–5.1)
Sodium: 138 mmol/L (ref 135–145)

## 2017-08-30 ENCOUNTER — Non-Acute Institutional Stay (SKILLED_NURSING_FACILITY): Payer: Medicare Other | Admitting: Internal Medicine

## 2017-08-30 ENCOUNTER — Encounter: Payer: Self-pay | Admitting: Internal Medicine

## 2017-08-30 DIAGNOSIS — E875 Hyperkalemia: Secondary | ICD-10-CM

## 2017-08-30 DIAGNOSIS — I10 Essential (primary) hypertension: Secondary | ICD-10-CM | POA: Diagnosis not present

## 2017-08-30 DIAGNOSIS — N183 Chronic kidney disease, stage 3 unspecified: Secondary | ICD-10-CM

## 2017-08-30 DIAGNOSIS — D631 Anemia in chronic kidney disease: Secondary | ICD-10-CM | POA: Diagnosis not present

## 2017-08-30 DIAGNOSIS — F0390 Unspecified dementia without behavioral disturbance: Secondary | ICD-10-CM

## 2017-08-30 NOTE — Progress Notes (Signed)
Location:   [facility] Room Number: 109/W Place of Service:  SNF (31) Provider:  Freddi Starr, MD  Patient Care Team: [name], MD as PCP - General (Internal Medicine)  Extended Emergency Contact Information Primary Emergency Contact: High,[name] Address: [address]          [place], [place] [address] [place] Phone: [phone] Relation: Other Secondary Emergency Contact: [name], [place] [address] [name] of [place] Phone: [phone] Mobile Phone: [phone] Relation: Niece  Code Status:  Full Code Goals of care: Advanced Directive information Advanced Directives 08/30/2017  Does Patient Have a Medical Advance Directive? Yes  Type of Advance Directive (No Data)  Does patient want to make changes to medical advance directive? No - Patient declined  Copy of [place] in Chart? No - copy requested  Pre-existing out of facility DNR order (yellow form or pink MOST form) -     Chief Complaint  Patient presents with  . Medical Management of Chronic Issues    Routine Visit  For medical management of chronic medical conditions including dementia-chronic renal insufficiency-history of hyperkalemia-hypertension-anemia with history of renal disease  HPI:  Pt is a 84 y.o. female seen today for medical management of chronic diseases.  As noted above-she continues to have a period of stability here her weight appears to be stable at 108 pounds.  Continues to ambulate about the facility in a wheelchair.  At times has increased confusion but is easily redirected.  She also has a history of renal anemia she is on Kayexalate 3 times a week and this appears stable potassium was 3.7 on lab done in late December.  Creatinine appears relatively baseline at 1.18.  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 Anemia   . Anxiety   . Dementia   . Dysphagia, oropharyngeal phase   . GERD (gastroesophageal reflux disease)   . Hyperpotassemia   . Hyperpotassemia   . Hypertension   . Other severe protein-calorie malnutrition    Past Surgical History:  Procedure Laterality Date  . ABDOMINAL HYSTERECTOMY    . APPENDECTOMY      No Known Allergies  Outpatient Encounter Medications as of 08/30/2017  Medication Sig  . amLODipine (NORVASC) 5 MG tablet Take 5 mg by mouth daily. For HTN  . Ferrous Sulfate (IRON) 325 (65 Fe) MG TABS Take 1 tablet by mouth once a day  . labetalol (NORMODYNE) 200 MG tablet Take 200 mg by mouth 2 (two) times daily. For HTN  . ranitidine (ZANTAC) 150 MG tablet Take 150 mg by mouth daily.  . sodium polystyrene (KAYEXALATE) 15 GM/60ML suspension Take 15 g by mouth once. Once a day on Sun,Tue,Thu   No facility-administered encounter medications on file as of 08/30/2017.      Review of Systems   This is limited somewhat secondary to dementia provided by nursing as well.  In general  is not complaining of any fever chills weight has been stable.  Skin is not complaining of any rashes or itching.  Head ears eyes nose mouth and throat denies visual changes or sore throat.  Respiratory does not complain of cough or shortness of breath.  Cardiac denies chest pain does not have significant lower extremity edema.  GI does not complain of abdominal discomfort appears to have regular bowel movements does not complain of any diarrhea nausea or vomiting.  Reviewed denies dysuria.  Musculoskeletal is not  complaining of any joint pain continues to ambulate in her wheelchair with relative ease.  Neurologic is not complaining of dizziness headache syncope or numbness.  And psych does have a history of dementia because he will have some agitation but this is usually of short duration of no    Immunization History  Administered Date(s) Administered  . Influenza-Unspecified 05/23/2014, 05/18/2016, 05/20/2017  . Pneumococcal Conjugate-13 11/07/2015  . Pneumococcal-Unspecified 05/25/2016  . Tdap 04/28/2017   Pertinent  Health Maintenance Due  Topic Date Due  . INFLUENZA VACCINE  Completed  . DEXA SCAN  Completed  . PNA vac Low Risk Adult  Completed   Fall Risk  04/25/2017  Falls in the past year? No   Functional Status Survey:    Vitals:   08/30/17 1414  BP: 122/81  Pulse: (!) 52  Resp: 16  Temp: (!) 96.5 F (35.8 C)  TempSrc: Oral  SpO2: 97%  Weight: 108 lb 9.6 oz (49.3 kg)  Height: 5' 5.5" (1.664 m)  Manual blood pressure on exam was 140/88-pulse was 78 Body mass index is 17.8 kg/m. Physical Exam   In general this is a pleasant elderly female in no distress.  Her skin is warm and dry.  Eyes continues with prescription lenses visual acuity appears intact sclera and conjunctive are clear.  Oropharynx is clear mucous membranes moist she has quite a few extractions.  Chest is clear to auscultation there is no labored breathing.  Heart continues to be regular rate and rhythm without murmur gallop or rub does not really have significant lower extremity edema.  Abdomen is soft nontender with active bowel sounds.  Musculoskeletal psych she is oriented to self is pleasant moves all her extremities x4 with baseline strength in all 4 extremities is ambulatory in wheelchair and does well with this.  Neurologic is grossly intact her speech is clear no lateralizing findings.   \Psych she is oriented to self is pleasant appropriate follow simple verbal commands without  difficulty    Labs reviewed: Recent Labs    06/07/17 0705 06/13/17 1520 08/05/17 0728  NA 136 138 138  K 5.0 4.6 3.7  CL 106 104 106  CO2 24 26 24   GLUCOSE 85 98 92  BUN 31* 32* 29*  CREATININE 1.27* 1.21* 1.18*  CALCIUM 9.4 9.8 9.4   Recent Labs    01/06/17 0729 06/13/17 1300  AST 19 22  ALT 8* 12*  ALKPHOS 77 86  BILITOT 0.6 0.5  PROT 6.5 7.6  ALBUMIN 3.5 4.0   Recent Labs    03/31/17 0800 06/07/17 0705 06/13/17 1300 08/05/17 0728  WBC 4.6 4.0 5.3 4.5  NEUTROABS 2.4  --  3.7 2.3  HGB 8.9* 9.0* 10.5* 9.6*  HCT 27.4* 27.3* 32.2* 29.5*  MCV 99.6 97.8 99.1 99.0  PLT 220 217 221 223   Lab Results  Component Value Date   TSH 2.867 01/06/2017   No results found for: HGBA1C No results found for: CHOL,  HDL, LDLCALC, LDLDIRECT, TRIG, CHOLHDL  Significant Diagnostic Results in last 30 days:  No results found.  Assessment/Plan  #1-dementia-she appears to be stable doing well with supportive care weight is stable occasional agitation but easily redirected  #2-history of chronic kidney disease with hyperkalemia this continues to be stable per recent lab with a creatinine of 1.18 BUN of 29- potassium 3.7-- has been stable for an extended period of time which is encouraging will monitor periodically  #3-history of anemia this is again secondary most likely to chronic disease renal- hemoglobin has been stable at 9.6 again will monitor this periodically.  4.  History of GERD this is been largely stable on Zantac.  5.  History of hypertension she is on labetalol and Norvasc as noted above I do not see consistent lows or elevations-I do note occasionally she will have pulse  less than 60 will write an order for labetalol to be held if pulse is less than 60   [id number]

## 2017-10-18 ENCOUNTER — Non-Acute Institutional Stay (SKILLED_NURSING_FACILITY): Payer: Medicare Other | Admitting: Internal Medicine

## 2017-10-18 ENCOUNTER — Encounter: Payer: Self-pay | Admitting: Internal Medicine

## 2017-10-18 DIAGNOSIS — F0390 Unspecified dementia without behavioral disturbance: Secondary | ICD-10-CM

## 2017-10-18 DIAGNOSIS — N183 Chronic kidney disease, stage 3 unspecified: Secondary | ICD-10-CM

## 2017-10-18 DIAGNOSIS — M810 Age-related osteoporosis without current pathological fracture: Secondary | ICD-10-CM

## 2017-10-18 DIAGNOSIS — I10 Essential (primary) hypertension: Secondary | ICD-10-CM | POA: Diagnosis not present

## 2017-10-18 NOTE — Progress Notes (Signed)
Location:   [facility] Room Number: 109/W Place of Service:  SNF (31) Provider:  Clydene Fake, MD  Patient Care Team: [name], MD as PCP - General (Internal Medicine)  Extended Emergency Contact Information Primary Emergency Contact: High,[name] Address: [address]          [place], [place] [address] [place] Phone: [phone] Relation: Other Secondary Emergency Contact: [name], [place] [address] [name] of [place] Phone: [phone] Mobile Phone: [phone] Relation: Niece  Code Status:  Full Code Goals of care: Advanced Directive information Advanced Directives 10/18/2017  Does Patient Have a Medical Advance Directive? Yes  Type of Advance Directive (No Data)  Does patient want to make changes to medical advance directive? No - Patient declined  Copy of [place] in Chart? No - copy requested  Pre-existing out of facility DNR order (yellow form or pink MOST form) -     Chief Complaint  Patient presents with  . Medical Management of Chronic Issues    Routine Visit    HPI:  Pt is a 84 y.o. female seen today for medical management of chronic diseases.    Patient has h/o Hyperkalemia secondary to Chronic renal insufficiency, hypertension, anemia secondary to renal diseaseAnd dementia without any behavior issues. Patient has been stable in the Facility. Her weight is stable at 110 Lbs. She had new complains. D/W Her nurse with No new Nursing Issues. She is pleasantly Demented and moves around in her wheelchair.  Past Medical History:  Diagnosis Date  . Anemia   . Anxiety   . Dementia   . Dysphagia, oropharyngeal phase   . GERD (gastroesophageal reflux disease)   . Hyperpotassemia   . Hyperpotassemia   . Hypertension   . Other severe protein-calorie malnutrition    Past Surgical History:  Procedure Laterality Date  . ABDOMINAL  HYSTERECTOMY    . APPENDECTOMY      No Known Allergies  Outpatient Encounter Medications as of 10/18/2017  Medication Sig  . amLODipine (NORVASC) 5 MG tablet Take 5 mg by mouth daily. For HTN  . Ferrous Sulfate (IRON) 325 (65 Fe) MG TABS Take 1 tablet by mouth once a day  . labetalol (NORMODYNE) 200 MG tablet Take 200 mg by mouth 2 (two) times daily. For HTN  . ranitidine (ZANTAC) 150 MG tablet Take 150 mg by mouth daily.  . sodium polystyrene (KAYEXALATE) 15 GM/60ML suspension Take 15 g by mouth once. Once a day on Sun,Tue,Thu   No facility-administered encounter medications on file as of 10/18/2017.      Review of Systems  Unable to perform ROS: Dementia    Immunization History  Administered Date(s) Administered  . Influenza-Unspecified 05/23/2014, 05/18/2016, 05/20/2017  . Pneumococcal Conjugate-13 11/07/2015  . Pneumococcal-Unspecified 05/25/2016  . Tdap 04/28/2017   Pertinent  Health Maintenance Due  Topic Date Due  . INFLUENZA VACCINE  Completed  . DEXA SCAN  Completed  . PNA vac Low Risk Adult  Completed   Fall Risk  04/25/2017  Falls in the past year? No   Functional Status Survey:    Vitals:   10/18/17 1157  BP: (!) 148/67  Pulse: 64  Resp: 18  Temp: (!) 97.3 F (36.3 C)  TempSrc: Oral  SpO2: 99%  Weight: 110 lb 6.4 oz (50.1 kg)  Height: 5' 5.5" (1.664 m)   Body mass index  is 18.09 kg/m. Physical Exam  Constitutional: She appears well-developed and well-nourished.  HENT:  Head: Normocephalic.  Mouth/Throat: Oropharynx is clear and moist.  Eyes: Pupils are equal, round, and reactive to light.  Neck: Neck supple.  Cardiovascular: Normal rate and normal heart sounds.  No murmur heard. Pulmonary/Chest: Effort normal and breath sounds normal. No respiratory distress. She has no wheezes. She has no rales.  Abdominal: Soft. Bowel sounds are normal. She exhibits no distension. There is no tenderness. There is no rebound.  Musculoskeletal: She exhibits no  edema.  Lymphadenopathy:    She has no cervical adenopathy.  Neurological: She is alert.  Skin: Skin is warm and dry.  Psychiatric: She has a normal mood and affect. Her behavior is normal. Thought content normal.    Labs reviewed: Recent Labs    06/07/17 0705 06/13/17 1520 08/05/17 0728  NA 136 138 138  K 5.0 4.6 3.7  CL 106 104 106  CO2 24 26 24   GLUCOSE 85 98 92  BUN 31* 32* 29*  CREATININE 1.27* 1.21* 1.18*  CALCIUM 9.4 9.8 9.4   Recent Labs    01/06/17 0729 06/13/17 1300  AST 19 22  ALT 8* 12*  ALKPHOS 77 86  BILITOT 0.6 0.5  PROT 6.5 7.6  ALBUMIN 3.5 4.0   Recent Labs    03/31/17 0800 06/07/17 0705 06/13/17 1300 08/05/17 0728  WBC 4.6 4.0 5.3 4.5  NEUTROABS 2.4  --  3.7 2.3  HGB 8.9* 9.0* 10.5* 9.6*  HCT 27.4* 27.3* 32.2* 29.5*  MCV 99.6 97.8 99.1 99.0  PLT 220 217 221 223   Lab Results  Component Value Date   TSH 2.867 01/06/2017   No results found for: HGBA1C No results found for: CHOL, HDL, LDLCALC, LDLDIRECT, TRIG, CHOLHDL  Significant Diagnostic Results in last 30 days:  No results found.  Assessment/Plan Essential hypertension Patient BP is mildly elevated  on Amlodipine and Labetalol. No Change right now   Stage 3 chronic kidney disease With Hyperkalemia Potassium stable on Kayexalate Last Bmp done in 12/18 Creat stable Repeat Bmp Next visit  Anemia due to stage 3 chronic kidney disease  On Iron Supplement. Hgb Stable GERD On Zantac.  Dementia Stable on Supportive care Age related Osteoporosis Tscore was - 2.8 Start on Fosamax  Family/ staff Communication:  Labs/tests ordered:    Total time spent in this patient care encounter was 25_ minutes; greater than 50% of the visit spent counseling patient, reviewing records , Labs and coordinating care for problems addressed at this encounter.

## 2017-11-10 ENCOUNTER — Encounter: Payer: Self-pay | Admitting: Internal Medicine

## 2017-11-10 ENCOUNTER — Non-Acute Institutional Stay (SKILLED_NURSING_FACILITY): Payer: Medicare Other | Admitting: Internal Medicine

## 2017-11-10 DIAGNOSIS — F0390 Unspecified dementia without behavioral disturbance: Secondary | ICD-10-CM

## 2017-11-10 NOTE — Progress Notes (Signed)
Location:   [facility] Room Number: 109/W Place of Service:  SNF (31) Provider:  Clydene Fake, MD  Patient Care Team: [name], MD as PCP - General (Internal Medicine)  Extended Emergency Contact Information Primary Emergency Contact: High,[name] Address: [address]          [place], [place] [address] [place] Phone: [phone] Relation: Other Secondary Emergency Contact: [name], [place] [address] [name] of [place] Phone: [phone] Mobile Phone: [phone] Relation: Niece  Code Status:  Full Code Goals of care: Advanced Directive information Advanced Directives 11/10/2017  Does Patient Have a Medical Advance Directive? Yes  Type of Advance Directive (No Data)  Does patient want to make changes to medical advance directive? No - Patient declined  Copy of [place] in Chart? No - copy requested  Pre-existing out of facility DNR order (yellow form or pink MOST form) -     Chief Complaint  Patient presents with  . Acute Visit    Patients c/o Wandering and trying to leave the building    HPI:  Pt is a 84 y.o. female seen today for an acute visit as the nurses were saying that patient was trying to leave the building. Patient has h/o Hyperkalemia secondary to Chronic renal insufficiency, hypertension, anemia secondary to renal diseaseAnd dementia without any behavior issues  Patient is a long-term resident of the facility.  She does have a history of dementia and sundowning.  She moves around in her wheelchair.  Yesterday evening per nurses patient told them that she was leaving the building and would not listen to them.  She was telling them that she wants to go home. This morning patient is back to her baseline.  She told me that some of the staff was through to her and she decided to just leave.  She said she wants to go home and stay with her  parents. Patient does not have any fever or chills cough or dysuria.  No nausea vomiting.  She is eating well.   Past Medical History:  Diagnosis Date  . Anemia   . Anxiety   . Dementia   . Dysphagia, oropharyngeal phase   . GERD (gastroesophageal reflux disease)   . Hyperpotassemia   . Hyperpotassemia   . Hypertension   . Other severe protein-calorie malnutrition    Past Surgical History:  Procedure Laterality Date  . ABDOMINAL HYSTERECTOMY    . APPENDECTOMY      No Known Allergies  Outpatient Encounter Medications as of 11/10/2017  Medication Sig  . amLODipine (NORVASC) 5 MG tablet Take 5 mg by mouth daily. For HTN  . Ferrous Sulfate (IRON) 325 (65 Fe) MG TABS Take 1 tablet by mouth once a day  . labetalol (NORMODYNE) 200 MG tablet Take 200 mg by mouth 2 (two) times daily. For HTN  . ranitidine (ZANTAC) 150 MG tablet Take 150 mg by mouth daily.  . sodium polystyrene (KAYEXALATE) 15 GM/60ML suspension Take 15 g by mouth once. Once a day on Sun,Tue,Thu   No facility-administered encounter medications on file as of 11/10/2017.      Review of Systems  Unable to perform ROS: Dementia    Immunization History  Administered Date(s) Administered  . Influenza-Unspecified 05/23/2014, 05/18/2016, 05/20/2017  . Pneumococcal Conjugate-13 11/07/2015  . Pneumococcal-Unspecified 05/25/2016  . Tdap 04/28/2017   Pertinent  Health Maintenance  Due  Topic Date Due  . INFLUENZA VACCINE  Completed  . DEXA SCAN  Completed  . PNA vac Low Risk Adult  Completed   Fall Risk  04/25/2017  Falls in the past year? No   Functional Status Survey:    Vitals:   11/10/17 1042  BP: (!) 149/77  Pulse: 71  Resp: 20  Temp: (!) 97.4 F (36.3 C)  TempSrc: Oral  SpO2: 98%   There is no height or weight on file to calculate BMI. Physical Exam  Constitutional: She appears well-developed and well-nourished.  HENT:  Head: Normocephalic.  Mouth/Throat: Oropharynx is clear and moist.  Eyes:  Pupils are equal, round, and reactive to light.  Neck: Neck supple.  Cardiovascular: Normal rate and normal heart sounds.  Pulmonary/Chest: Effort normal and breath sounds normal. No respiratory distress. She has no wheezes.  Abdominal: Soft. Bowel sounds are normal. She exhibits no distension. There is no tenderness. There is no rebound.  Musculoskeletal: She exhibits no edema.  Lymphadenopathy:    She has no cervical adenopathy.  Neurological: She is alert.  Oriented to Self. Not oriented to Place or time.  Skin: Skin is warm and dry.  Psychiatric: She has a normal mood and affect. Her behavior is normal. Thought content normal.    Labs reviewed: Recent Labs    06/07/17 0705 06/13/17 1520 08/05/17 0728  NA 136 138 138  K 5.0 4.6 3.7  CL 106 104 106  CO2 24 26 24   GLUCOSE 85 98 92  BUN 31* 32* 29*  CREATININE 1.27* 1.21* 1.18*  CALCIUM 9.4 9.8 9.4   Recent Labs    01/06/17 0729 06/13/17 1300  AST 19 22  ALT 8* 12*  ALKPHOS 77 86  BILITOT 0.6 0.5  PROT 6.5 7.6  ALBUMIN 3.5 4.0   Recent Labs    03/31/17 0800 06/07/17 0705 06/13/17 1300 08/05/17 0728  WBC 4.6 4.0 5.3 4.5  NEUTROABS 2.4  --  3.7 2.3  HGB 8.9* 9.0* 10.5* 9.6*  HCT 27.4* 27.3* 32.2* 29.5*  MCV 99.6 97.8 99.1 99.0  PLT 220 217 221 223   Lab Results  Component Value Date   TSH 2.867 01/06/2017   No results found for: HGBA1C No results found for: CHOL, HDL, LDLCALC, LDLDIRECT, TRIG, CHOLHDL  Significant Diagnostic Results in last 30 days:  No results found.  Assessment/Plan  Sundowning with Worsening Dementia. Will Check her Labs BMP and CBC Also start her on Aricept 5 mg  She can have component of Depression. Will consider Zoloft if her symptoms don't improve. Continue Nursing Supervision as she is Flight Risk   Family/ staff Communication:   Labs/tests ordered:   Total time spent in this patient care encounter was _25 minutes; greater than 50% of the visit spent counseling  patient, reviewing records , Labs and coordinating care for problems addressed at this encounter.

## 2017-11-11 LAB — CBC WITH DIFFERENTIAL/PLATELET
BASOS ABS: 0 10*3/uL (ref 0.0–0.1)
Basophils Relative: 0 %
Eosinophils Absolute: 0.2 10*3/uL (ref 0.0–0.7)
Eosinophils Relative: 3 %
HCT: 28.4 % — ABNORMAL LOW (ref 36.0–46.0)
Hemoglobin: 9.3 g/dL — ABNORMAL LOW (ref 12.0–15.0)
LYMPHS PCT: 31 %
Lymphs Abs: 1.4 10*3/uL (ref 0.7–4.0)
MCH: 32.5 pg (ref 26.0–34.0)
MCHC: 32.7 g/dL (ref 30.0–36.0)
MCV: 99.3 fL (ref 78.0–100.0)
Monocytes Absolute: 0.5 10*3/uL (ref 0.1–1.0)
Monocytes Relative: 10 %
NEUTROS ABS: 2.5 10*3/uL (ref 1.7–7.7)
Neutrophils Relative %: 56 %
Platelets: 216 10*3/uL (ref 150–400)
RBC: 2.86 MIL/uL — AB (ref 3.87–5.11)
RDW: 13.3 % (ref 11.5–15.5)
WBC: 4.5 10*3/uL (ref 4.0–10.5)

## 2017-11-11 LAB — COMPREHENSIVE METABOLIC PANEL
ALK PHOS: 67 U/L (ref 38–126)
ALT: 11 U/L — AB (ref 14–54)
AST: 19 U/L (ref 15–41)
Albumin: 3.2 g/dL — ABNORMAL LOW (ref 3.5–5.0)
Anion gap: 11 (ref 5–15)
BUN: 27 mg/dL — AB (ref 6–20)
CALCIUM: 9.2 mg/dL (ref 8.9–10.3)
CO2: 22 mmol/L (ref 22–32)
CREATININE: 1.13 mg/dL — AB (ref 0.44–1.00)
Chloride: 104 mmol/L (ref 101–111)
GFR calc Af Amer: 47 mL/min — ABNORMAL LOW (ref >60)
GFR, EST NON AFRICAN AMERICAN: 40 mL/min — AB (ref >60)
Glucose, Bld: 93 mg/dL (ref 65–99)
Potassium: 3.8 mmol/L (ref 3.5–5.1)
Sodium: 137 mmol/L (ref 135–145)
TOTAL PROTEIN: 6.2 g/dL — AB (ref 6.5–8.1)
Total Bilirubin: 0.7 mg/dL (ref 0.3–1.2)

## 2017-11-15 ENCOUNTER — Encounter: Payer: Self-pay | Admitting: Internal Medicine

## 2017-11-15 ENCOUNTER — Non-Acute Institutional Stay (SKILLED_NURSING_FACILITY): Payer: Medicare Other | Admitting: Internal Medicine

## 2017-11-15 DIAGNOSIS — E875 Hyperkalemia: Secondary | ICD-10-CM

## 2017-11-15 DIAGNOSIS — F0390 Unspecified dementia without behavioral disturbance: Secondary | ICD-10-CM | POA: Diagnosis not present

## 2017-11-15 DIAGNOSIS — D631 Anemia in chronic kidney disease: Secondary | ICD-10-CM

## 2017-11-15 DIAGNOSIS — I10 Essential (primary) hypertension: Secondary | ICD-10-CM | POA: Diagnosis not present

## 2017-11-15 DIAGNOSIS — N183 Chronic kidney disease, stage 3 unspecified: Secondary | ICD-10-CM

## 2017-11-15 NOTE — Progress Notes (Signed)
Location:   [facility] Room Number: 109/W Place of Service:  SNF (31) Provider:  Freddi Starr, MD  Patient Care Team: [name], MD as PCP - General (Internal Medicine)  Extended Emergency Contact Information Primary Emergency Contact: [name] Address: [address]          [place], [place] [address] [place] Phone: [phone] Relation: Other Secondary Emergency Contact: [name],  [address] [name] of [place] Phone: [phone] Mobile Phone: [phone] Relation: Niece  Code Status:  Full Code Goals of care: Advanced Directive information Advanced Directives 11/15/2017  Does Patient Have a Medical Advance Directive? Yes  Type of Advance Directive (No Data)  Does patient want to make changes to medical advance directive? No - Patient declined  Copy of [place] in Chart? No - copy requested  Pre-existing out of facility DNR order (yellow form or pink MOST form) -     Chief Complaint  Patient presents with  . Medical Management of Chronic Issues    Routine VIsit  For medical management of chronic medical conditions including dementia-chronic renal insufficiency-hypertension-anemia with history of renal disease- as well as history of hyperkalemia i secondary to renal disease  HPI:  Pt is a 84 y.o. female seen today for medical management of chronic diseases as noted above. Quite stable Dr. Lyndel Safe did see her recently for wandering issues-- patient fairly adamant at times that she wants to go home-stating that she is going to leave the building.  She has been started on Aricept low-dose.  Apparently she still has episodes like this but this is more confined to the evening hours--and often can be redirected Her other medical issues appear to be stable-   has a history of chronic kidney disease and is on Kayexalate with a history of heart labs shows  stability with a potassium of 3.8 BUN of 27 and creatinine of 1.13  Regards to hypertension she is on Norvasc and labetalol I do not see consistent elevation--blood pressure of 118/74 appears to be stable   does have anemia of chronic disease this is stable as well at 9.3 baseline appears to be in the high eights up to mid 10's     Past Medical History:  Diagnosis Date  . Anemia   . Anxiety   . Dementia   . Dysphagia, oropharyngeal phase   . GERD (gastroesophageal reflux disease)   . Hyperpotassemia   . Hyperpotassemia   . Hypertension   . Other severe protein-calorie malnutrition    Past Surgical History:  Procedure Laterality Date  . ABDOMINAL HYSTERECTOMY    . APPENDECTOMY      No Known Allergies  Outpatient Encounter Medications as of 11/15/2017  Medication Sig  . amLODipine (NORVASC) 5 MG tablet Take 5 mg by mouth daily. For HTN  . donepezil (ARICEPT) 5 MG tablet Take 5 mg by mouth at bedtime.  . Ferrous Sulfate (IRON) 325 (65 Fe) MG TABS Take 1 tablet by mouth once a day  . labetalol (NORMODYNE) 200 MG tablet Take 200 mg by mouth 2 (two) times daily. For HTN  . ranitidine (ZANTAC) 150 MG tablet Take 150 mg by mouth daily.  . sodium polystyrene (KAYEXALATE) 15 GM/60ML suspension Take 15 g by mouth once. Once a day on Sun,Tue,Thu   No facility-administered encounter medications on file as of 11/15/2017.  Review of Systems   This is limited secondary to dementia provided by nursing as well.  In general no complaints of fever chills her weight is stable.  Skin is not complain of rashes or itching.  Head ears eyes nose mouth and throat-no complaints of sore throat or visual changes.  Respiratory is not complaining of cough shortness of breath.  Cardiac no complaints of chest pain or edema.  GI is not complaining of abdominal discomfort nausea vomiting diarrhea constipation.  She eats on average 25-75% of her meals.  GU does not complain of  dysuria.  Musculoskeletal is not complaining of joint pain is ambulatory in wheelchair and does well with this.  Neurologic does not complain of headache dizziness syncope or numbness.  Psych has a history of dementia with some watering per nursing she  Often an be redirected when she has episodes wanting to go home    Immunization History  Administered Date(s) Administered  . Influenza-Unspecified 05/23/2014, 05/18/2016, 05/20/2017  . Pneumococcal Conjugate-13 11/07/2015  . Pneumococcal-Unspecified 05/25/2016  . Tdap 04/28/2017   Pertinent  Health Maintenance Due  Topic Date Due  . INFLUENZA VACCINE  03/16/2018  . DEXA SCAN  Completed  . PNA vac Low Risk Adult  Completed   Fall Risk  04/25/2017  Falls in the past year? No   Functional Status Survey:    Vitals:   11/15/17 1154  BP: 118/67  Pulse: 78  Resp: 18  Temp: 97.6 F (36.4 C)  TempSrc: Oral  SpO2: 98%  Weight: 108 lb 9.6 oz (49.3 kg)  Height: 5' 5.5" (1.664 m)  Manual blood pressure was 138/84  Body mass index is 17.8 kg/m. Physical Exam   In general this is a pleasant elderly female in no distress.  Her skin is warm and dry.  Eyes has prescription lenses visual acuity appears to be baseline and intact.  Oropharynx clear mucous membranes moist.  Chest is clear to auscultation there is no labored breathing.  Heart is regular rate and rhythm without murmur gallop or rub does not really have lower extremity edema of note.  Abdomen continues to be soft nontender with active bowel sounds.  Musculoskeletal is able to move all extremity's x4 ambulates well in wheelchair strength appears to be intact and at baseline all 4 extremities. She has arthritic changes of her knees bilaterally could not really appreciate any significant erythema warmth or concerning edema.  She also has somewhat of a chronic lump on her right upper arm this is unchanged suspect it may be bicep related but again it is nontender and  does not cause her any problem  Neurologic is grossly intact her speech is clear no lateralizing findings.  Psych is oriented to self is pleasant appropriate follow simple verbal commands can carry on a short conversation #1-history of dementia-she has been started on low-dose Aricept-  Labs reviewed: Recent Labs    06/13/17 1520 08/05/17 0728 11/11/17 0700  NA 138 138 137  K 4.6 3.7 3.8  CL 104 106 104  CO2 26 24 22   GLUCOSE 98 92 93  BUN 32* 29* 27*  CREATININE 1.21* 1.18* 1.13*  CALCIUM 9.8 9.4 9.2   Recent Labs    01/06/17 0729 06/13/17 1300 11/11/17 0700  AST 19 22 19   ALT 8* 12* 11*  ALKPHOS 77 86 67  BILITOT 0.6 0.5 0.7  PROT 6.5 7.6 6.2*  ALBUMIN 3.5 4.0 3.2*   Recent Labs    06/13/17 1300 08/05/17 0728 11/11/17  0700  WBC 5.3 4.5 4.5  NEUTROABS 3.7 2.3 2.5  HGB 10.5* 9.6* 9.3*  HCT 32.2* 29.5* 28.4*  MCV 99.1 99.0 99.3  PLT 221 223 216   Lab Results  Component Value Date   TSH 2.867 01/06/2017   No results found for: HGBA1C No results found for: CHOL, HDL, LDLCALC, LDLDIRECT, TRIG, CHOLHDL  Significant Diagnostic Results in last 30 days:  No results found.  Assessment/Plan Dementia---This appears to be relatively stable she has been started on Aricept-at times will still states she wants to leave the building--- This will need to be monitored her weight is stable--.  2.  Hypertension this appears stable as noted above she is on Norvasc and labetalol.  3.  Anemia-with an element of chronic disease-this appears stable with a hemoglobin of 9.3 on recent lab.  4.  History of chronic kidney disease this appears stable with a creatinine of 1.13 BUN of 27 on recent lab-for associated hyperkalemia she is on Kayexalate and potassium is stable at 3.8.  5.  History of GERD this has manual blood pressure was 138/84 been stable for some time on Zantac.  6.  History of osteoporosis she had a T score of -2.8 she has been started on Fosamax and appears to be  tolerating this well.  [id number]

## 2017-12-05 ENCOUNTER — Non-Acute Institutional Stay (SKILLED_NURSING_FACILITY): Payer: Medicare Other | Admitting: Internal Medicine

## 2017-12-05 ENCOUNTER — Encounter: Payer: Self-pay | Admitting: Internal Medicine

## 2017-12-05 DIAGNOSIS — R05 Cough: Secondary | ICD-10-CM

## 2017-12-05 DIAGNOSIS — R0989 Other specified symptoms and signs involving the circulatory and respiratory systems: Secondary | ICD-10-CM

## 2017-12-05 DIAGNOSIS — J309 Allergic rhinitis, unspecified: Secondary | ICD-10-CM | POA: Diagnosis not present

## 2017-12-05 DIAGNOSIS — R059 Cough, unspecified: Secondary | ICD-10-CM

## 2017-12-05 NOTE — Progress Notes (Signed)
Location:   [facility] Room Number: 109/W Place of Service:  SNF [phone]) Provider:  Freddi Starr, MD  Patient Care Team: [name], MD as PCP - General (Internal Medicine)  Extended Emergency Contact Information Primary Emergency Contact: [name] Address: [address]          [place], [place] [address] [place] Phone: [phone] Relation: Other Secondary Emergency Contact: [name], [place] [address] [name] of [place] Phone: [phone] Mobile Phone: [phone] Relation: Niece  Code Status:  Full Code Goals of care: Advanced Directive information Advanced Directives 12/05/2017  Does Patient Have a Medical Advance Directive? Yes  Type of Advance Directive (No Data)  Does patient want to make changes to medical advance directive? No - Patient declined  Copy of [place] in Chart? No - copy requested  Pre-existing out of facility DNR order (yellow form or pink MOST form) -     Chief complaint-acute visit secondary to cough and congestion  HPI:  Pt is a 84 y.o. female seen today for an acute visit for  Follow-up of cough and congestion.  Patient is a long-term resident of facilityWith a history of dementia as well as renal insufficiency and hypertension as well as anemia and a history of hyperkalemia secondary to renal disease she has been stable now for a considerable amount of time-  However apparently she has developed some cough with congestion- apparently there was a short episode of a bloody nose as well but this was thought to be caused by finger manipulation with patient having a stuffy nose and patient attempting  to a;lleviate by finger manipulation   Currently she has no complaints but this is normal for her vital signs are stable she is afebrile She denies any shortness of breath does say she has sneezing she attributes her symptoms to  "hayfever"  Past Medical History:  Diagnosis Date  . Anemia   . Anxiety   . Dementia   . Dysphagia, oropharyngeal phase   . GERD (gastroesophageal reflux disease)   . Hyperpotassemia   . Hyperpotassemia   . Hypertension   . Other severe protein-calorie malnutrition    Past Surgical History:  Procedure Laterality Date  . ABDOMINAL HYSTERECTOMY    . APPENDECTOMY      No Known Allergies  Outpatient Encounter Medications as of 12/05/2017  Medication Sig  . amLODipine (NORVASC) 5 MG tablet Take 5 mg by mouth daily. For HTN  . donepezil (ARICEPT) 5 MG tablet Take 5 mg by mouth at bedtime.  . Ferrous Sulfate (IRON) 325 (65 Fe) MG TABS Take 1 tablet by mouth once a day  . labetalol (NORMODYNE) 200 MG tablet Take 200 mg by mouth 2 (two) times daily. For HTN  . ranitidine (ZANTAC) 150 MG tablet Take 150 mg by mouth daily.  . sodium polystyrene (KAYEXALATE) 15 GM/60ML suspension Take 15 g by mouth once. Once a day on Sun,Tue,Thu   No facility-administered encounter medications on file as of 12/05/2017.     Review of Systems  This is limited secondary to dementia provided by nursing as well.   General she is not complaining of any fever or chills.  Skin is not complain of rashes itching or diaphoresis.  Head ears eyes nose mouth and throat is complaining of some "hayfever" symptoms with slight eye drainage some nasal drainage and stuffiness also had a  slight nosebleed as noted above does not complain of sore throat.  Respiratory has a cough she describes this is dry does not complain of any shortness of breath.  Cardiac is not complaining of chest pain has no edema.  GI does not complain of abdominal discomfort nausea vomiting diarrhea constipation.  Musculoskeletal was not complaining of any joint pain.  Neurologic does not complain of having headache or dizziness or syncope or numbness.  Psyche-- does have a history of dementia this appears to be stable she is pleasant and  cooperative does not appear overtly anxious actually reading the paper  Immunization History  Administered Date(s) Administered  . Influenza-Unspecified 05/23/2014, 05/18/2016, 05/20/2017  . Pneumococcal Conjugate-13 11/07/2015  . Pneumococcal-Unspecified 05/25/2016  . Tdap 04/28/2017   Pertinent  Health Maintenance Due  Topic Date Due  . INFLUENZA VACCINE  03/16/2018  . DEXA SCAN  Completed  . PNA vac Low Risk Adult  Completed   Fall Risk  04/25/2017  Falls in the past year? No   Functional Status Survey:    Vitals:   12/05/17 1154  BP: 132/78  Pulse: 83  Resp: 20  Temp: (!) 97 F (36.1 C)  TempSrc: Oral  SpO2: 95%    Physical Exam In general is a pleasant elderly female no distress sitting comfortably in her wheelchair.  Her skin is warm and dry.  Eyes she has possibly some slight clear drainage acuity appears to be intact sclera and conjunctive are clear.   Nose-there is some  minimal dried blood in the turbinates- no active drainage or bleeding   Oropharynx is clear mucous membranes moist.  Chest she has some  rhonchi anterior fields that clear with cough somewhat shallow air entry there is no labored breathing heart is regular rate and rhythm without murmur gallop or rub she does not have  Significant lower extremity edema.  Abdomen is soft nontender with positive bowel sounds.  Musculoskeletal does move all extremities x4 at baseline ambulates in a wheelchair and does well with this strength appears to be intact all 4 extremities.  Neurologic is grossly intact her speech is clear no lateralizing findings.  Psych she is oriented to self pleasant and cooperative she was reading the paper but could not really tell me what was in the paper.   Labs reviewed: Recent Labs    06/13/17 1520 08/05/17 0728 11/11/17 0700  NA 138 138 137  K 4.6 3.7 3.8  CL 104 106 104  CO2 26 24 22   GLUCOSE 98 92 93  BUN 32* 29* 27*  CREATININE 1.21* 1.18* 1.13*  CALCIUM  9.8 9.4 9.2   Recent Labs    01/06/17 0729 06/13/17 1300 11/11/17 0700  AST 19 22 19   ALT 8* 12* 11*  ALKPHOS 77 86 67  BILITOT 0.6 0.5 0.7  PROT 6.5 7.6 6.2*  ALBUMIN 3.5 4.0 3.2*   Recent Labs    06/13/17 1300 08/05/17 0728 11/11/17 0700  WBC 5.3 4.5 4.5  NEUTROABS 3.7 2.3 2.5  HGB 10.5* 9.6* 9.3*  HCT 32.2* 29.5* 28.4*  MCV 99.1 99.0 99.3  PLT 221 223 216   Lab Results  Component Value Date   TSH 2.867 01/06/2017   No results found for: HGBA1C No results found for: CHOL, HDL, LDLCALC, LDLDIRECT, TRIG, CHOLHDL  Significant Diagnostic Results in last 30 days:  No results found.  Assessment/Plan  #1- cough with congestion possibly allergies- will treat cough with Mucinex 600 mg twice daily for 7 days- also  will add Flonase 1 spray each nostril twice daily for 5 days- also will obtain a chest x-ray secondary to some slight chest congestion noted on x-ray.  Monitor her vital signs pulse ox every shift for 48 hours  #2 nosebleed-this appears to have been probably quite transitory suspect may be secondary to  finger trauma physical exam was quite benign at this point will monitor.  .  [id number]

## 2017-12-15 ENCOUNTER — Encounter: Payer: Self-pay | Admitting: Internal Medicine

## 2017-12-15 ENCOUNTER — Non-Acute Institutional Stay (SKILLED_NURSING_FACILITY): Payer: Medicare Other | Admitting: Internal Medicine

## 2017-12-15 DIAGNOSIS — I10 Essential (primary) hypertension: Secondary | ICD-10-CM | POA: Diagnosis not present

## 2017-12-15 DIAGNOSIS — D631 Anemia in chronic kidney disease: Secondary | ICD-10-CM | POA: Diagnosis not present

## 2017-12-15 DIAGNOSIS — K219 Gastro-esophageal reflux disease without esophagitis: Secondary | ICD-10-CM

## 2017-12-15 DIAGNOSIS — F0390 Unspecified dementia without behavioral disturbance: Secondary | ICD-10-CM

## 2017-12-15 DIAGNOSIS — N183 Chronic kidney disease, stage 3 (moderate): Secondary | ICD-10-CM | POA: Diagnosis not present

## 2017-12-15 DIAGNOSIS — E875 Hyperkalemia: Secondary | ICD-10-CM | POA: Diagnosis not present

## 2017-12-15 NOTE — Progress Notes (Signed)
Location:   [facility] Room Number: 109/W Place of Service:  SNF (31) Provider:  Freddi Starr, MD  Patient Care Team: [name], MD as PCP - General (Internal Medicine)  Extended Emergency Contact Information Primary Emergency Contact: [place],[name] Address: [address]          [place], [place] [address] [place] Phone: [phone] Relation: Other Secondary Emergency Contact: [name], [place] [address] [name] of [place] Phone: [phone] Mobile Phone: [phone] Relation: Niece  Code Status:  Full Code Goals of care: Advanced Directive information Advanced Directives 12/15/2017  Does Patient Have a Medical Advance Directive? Yes  Type of Advance Directive (No Data)  Does patient want to make changes to medical advance directive? No - Patient declined  Copy of [place] in Chart? No - copy requested  Pre-existing out of facility DNR order (yellow form or pink MOST form) -     Chief Complaint  Patient presents with  . Medical Management of Chronic Issues    Routine Visit  For medical management of chronic medical conditions including dementia renal insufficiency-hyperkalemia secondary to renal insufficiency-hypertension- anemia of chronic disease-  HPI:  Pt is a 84 y.o. female seen today for medical management of chronic diseases.--As noted above   she continues to be quite stable weight is stable at around 108 pounds--continues to ambulate about facility in her wheelchair usually in a pleasant manner at times will get a bit confused and agitated but usually can be redirected.  She has been started on low-dose Aricept.  I saw her recently for a cough thought possibly having an allergic rhinitis component she is completed a course of Flonase and Mucinex per nursing this has improved.  She does have baseline renal insufficiency with associated hyperkalemia  but this is been stable she is on Kayexalate 3 times a week last potassium was 3.8 BUN was 27 creatinine 1.13 in late March of this is been stable now for an extended period of time.  She also has a history of anemia of chronic disease this appears relatively stable as well with a hemoglobin of 9.3 she is on iron.  Currently she has no acute complaints.  She is sitting in her wheelchair comfortably vital signs are stable her blood pressure however is elevated currently I 164/80 manually-however I see other readings as low as 105/52-142/80-she has been ambulating about in her wheelchair which may be contributing to this.  She is on Norvasc and labetalol  Past Medical History:  Diagnosis Date  . Anemia   . Anxiety   . Dementia   . Dysphagia, oropharyngeal phase   . GERD (gastroesophageal reflux disease)   . Hyperpotassemia   . Hyperpotassemia   . Hypertension   . Other severe protein-calorie malnutrition    Past Surgical History:  Procedure Laterality Date  . ABDOMINAL HYSTERECTOMY    . APPENDECTOMY      No Known Allergies  Outpatient Encounter Medications as of 12/15/2017  Medication Sig  . amLODipine (NORVASC) 5 MG tablet Take 5 mg by mouth daily. For HTN  . donepezil (ARICEPT) 5 MG tablet Take 5 mg by mouth at bedtime.  . Ferrous Sulfate (IRON) 325 (65 Fe) MG TABS Take 1 tablet by mouth once a day  . labetalol (NORMODYNE) 200 MG tablet Take 200 mg by mouth 2 (two) times daily. For HTN  .  ranitidine (ZANTAC) 150 MG tablet Take 150 mg by mouth daily.  . sodium polystyrene (KAYEXALATE) 15 GM/60ML suspension Take 15 g by mouth once. Once a day on Sun,Tue,Thu   No facility-administered encounter medications on file as of 12/15/2017.      Review of Systems   This is limited secondary to dementia provided by nursing as well.  In general is not complaining any fever chills her weight has been stable.  Skin does not complain of rashes or itching.  Head ears eyes nose mouth and  throat has prescription lenses does not really complain of eye drainage today sore throat or visual changes or nasal stuffiness.  Respiratory is not complaining of shortness of breath or cough.  Cardiac is not complaining of chest pain or significant lower extremity edema.  GI no complaints of abdominal discomfort nausea vomiting diarrhea constipation.  Musculoskeletal does not complain of joint pain.  Neurologic is not complaining of feeling dizzy or having a headache or dizziness or numbness.  Psych does have a history of dementia at times some wandering episodes but she is easily redirected-she is pleasant and cooperative with exam apparently later that day at times becomes a bit agitated  Immunization History  Administered Date(s) Administered  . Influenza-Unspecified 05/23/2014, 05/18/2016, 05/20/2017  . Pneumococcal Conjugate-13 11/07/2015  . Pneumococcal-Unspecified 05/25/2016  . Tdap 04/28/2017   Pertinent  Health Maintenance Due  Topic Date Due  . INFLUENZA VACCINE  03/16/2018  . DEXA SCAN  Completed  . PNA vac Low Risk Adult  Completed   Fall Risk  04/25/2017  Falls in the past year? No   Functional Status Survey:    Vitals:   12/15/17 1455  BP: (!) 164/81  Pulse: 72  Resp: 20  Temp: (!) 97.5 F (36.4 C)  TempSrc: Oral  SpO2: 95%  Weight: 108 lb (49 kg)  Height: 5' 5.5" (1.664 m)  Again blood pressures appear to be variable ranging from the low 100s to higher 160s but not consistently elevated Body mass index is 17.7 kg/m. Physical Exam   In general this is a pleasant elderly female no distress sitting comfortably in her wheelchair.  Her skin is warm and dry.  Eyes she has prescription lenses visual acuity appears grossly intact sclera and conjunctive are clear I do not note any drainage.  Oropharynx is clear she has numerous extractions mucous membranes are moist.  Chest is clear to auscultation there is no labored breathing.  Heart is regular rate  and rhythm without murmur gallop or rub she does not have significant lower extremity edema.  Abdomen is soft nontender with positive bowel sounds.  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  Neurologic is grossly intact her speech is clear no lateralizing findings cranial nerves appear to be intact.  Psych she is oriented to self pleasant and appropriate follow simple verbal commands continues to be able to carry on a short conversation usually in a very pleasant manner      Labs reviewed: Recent Labs    06/13/17 1520 08/05/17 0728 11/11/17 0700  NA 138 138 137  K 4.6 3.7 3.8  CL 104 106 104  CO2 26 24 22   GLUCOSE 98 92 93  BUN 32* 29* 27*  CREATININE 1.21* 1.18* 1.13*  CALCIUM 9.8 9.4 9.2   Recent Labs    01/06/17 0729 06/13/17 1300 11/11/17 0700  AST 19 22 19  ALT 8* 12* 11*  ALKPHOS 77 86 67  BILITOT 0.6 0.5 0.7  PROT 6.5 7.6 6.2*  ALBUMIN 3.5 4.0 3.2*   Recent Labs    06/13/17 1300 08/05/17 0728 11/11/17 0700  WBC 5.3 4.5 4.5  NEUTROABS 3.7 2.3 2.5  HGB 10.5* 9.6* 9.3*  HCT 32.2* 29.5* 28.4*  MCV 99.1 99.0 99.3  PLT 221 223 216   Lab Results  Component Value Date   TSH 2.867 01/06/2017   No results found for: HGBA1C No results found for: CHOL, HDL, LDLCALC, LDLDIRECT, TRIG, CHOLHDL  Significant Diagnostic Results in last 30 days:  No results found.  Assessment/Plan  I--#1 dementia- this appears to be stable she has been started on low-dose Aricept appears to tolerate this well noted that evening sometimes she will have some continued agitation but appears to be able to be easily  redirected.  Her weight is stable.  2.  Hypertension appears to have some variable blood pressures as noted above but not consistent elevations at this point continue Norvasc and labetalol I do note she has some lower readings at times which would make me hesitant  to be too aggressive here.---will check QD to establish cnsistency 3.  Anemia with an element of chronic disease she is on iron hemoglobin appears to be stable at 9.3 will monitor periodically.  4.  History of chronic kidney disease with hyperkalemia this is been stable for an extended period of time most recent potassium was 3.8 BUN 27 creatinine 1.13-will monitor periodically.  5.-  History of GERD she continues on Zantac and this appears to be stable.    #6 osteoporosis?  Appears last month there was a thought that she has had a DEXA scan and she was started on Fosamax but this does not appear to be the case we will discuss this with Dr. Lyndel Safe  about need for a DEXA scan-----she is not on Fosamax.  [id number]

## 2018-01-16 ENCOUNTER — Non-Acute Institutional Stay (SKILLED_NURSING_FACILITY): Payer: Medicare Other | Admitting: Internal Medicine

## 2018-01-16 ENCOUNTER — Encounter: Payer: Self-pay | Admitting: Internal Medicine

## 2018-01-16 DIAGNOSIS — N183 Chronic kidney disease, stage 3 unspecified: Secondary | ICD-10-CM

## 2018-01-16 DIAGNOSIS — M810 Age-related osteoporosis without current pathological fracture: Secondary | ICD-10-CM

## 2018-01-16 DIAGNOSIS — E875 Hyperkalemia: Secondary | ICD-10-CM | POA: Diagnosis not present

## 2018-01-16 DIAGNOSIS — F0390 Unspecified dementia without behavioral disturbance: Secondary | ICD-10-CM | POA: Diagnosis not present

## 2018-01-16 DIAGNOSIS — I10 Essential (primary) hypertension: Secondary | ICD-10-CM

## 2018-01-16 DIAGNOSIS — D631 Anemia in chronic kidney disease: Secondary | ICD-10-CM

## 2018-01-16 DIAGNOSIS — K219 Gastro-esophageal reflux disease without esophagitis: Secondary | ICD-10-CM | POA: Diagnosis not present

## 2018-01-16 NOTE — Progress Notes (Signed)
Location:   [facility] Room Number: 109/W Place of Service:  SNF (31) Provider:  Clydene Fake, MD  Patient Care Team: [name], MD as PCP - General (Internal Medicine)  Extended Emergency Contact Information Primary Emergency Contact: [name] Address: [name]          [place], [place] [address] [place] Phone: [phone] Relation: Other Secondary Emergency Contact: [name], [place] [address] [name] of [place] Phone: [phone] Mobile Phone: [phone] Relation: Niece  Code Status:  Full Code Goals of care: Advanced Directive information Advanced Directives 01/16/2018  Does Patient Have a Medical Advance Directive? Yes  Type of Advance Directive (No Data)  Does patient want to make changes to medical advance directive? No - Patient declined  Copy of [place] in Chart? No - copy requested  Pre-existing out of facility DNR order (yellow form or pink MOST form) -     Chief Complaint  Patient presents with  . Medical Management of Chronic Issues    Patientbeing seen for Routine Visit    HPI:  Pt is a 84 y.o. female seen today for medical management of chronic diseases.    Patient has h/o Hyperkalemia secondary to Chronic renal insufficiency, hypertension, anemia secondary to renal diseaseAnd dementia   Patient has been stable in the facility. She was started on Aricept few months ago for some behavior issues. She does get little anxious in evening and tries to leave the facility but usually responds to redirection. Her weight is stable at 110 lbs. No New Nursing issues  Past Medical History:  Diagnosis Date  . Anemia   . Anxiety   . Dementia   . Dysphagia, oropharyngeal phase   . GERD (gastroesophageal reflux disease)   . Hyperpotassemia   . Hyperpotassemia   . Hypertension   . Other severe protein-calorie malnutrition    Past  Surgical History:  Procedure Laterality Date  . ABDOMINAL HYSTERECTOMY    . APPENDECTOMY      No Known Allergies  Outpatient Encounter Medications as of 01/16/2018  Medication Sig  . amLODipine (NORVASC) 5 MG tablet Take 5 mg by mouth daily. For HTN  . donepezil (ARICEPT) 5 MG tablet Take 5 mg by mouth at bedtime.  . Ferrous Sulfate (IRON) 325 (65 Fe) MG TABS Take 1 tablet by mouth once a day  . labetalol (NORMODYNE) 200 MG tablet Take 200 mg by mouth 2 (two) times daily. For HTN  . ranitidine (ZANTAC) 150 MG tablet Take 150 mg by mouth daily.  . sodium polystyrene (KAYEXALATE) 15 GM/60ML suspension Take 15 g by mouth once. Once a day on Sun,Tue,Thu   No facility-administered encounter medications on file as of 01/16/2018.      Review of Systems  Unable to perform ROS: Dementia    Immunization History  Administered Date(s) Administered  . Influenza-Unspecified 05/23/2014, 05/18/2016, 05/20/2017  . Pneumococcal Conjugate-13 11/07/2015  . Pneumococcal-Unspecified 05/25/2016  . Tdap 04/28/2017   Pertinent  Health Maintenance Due  Topic Date Due  . INFLUENZA VACCINE  03/16/2018  . DEXA SCAN  Completed  . PNA vac Low Risk Adult  Completed   Fall Risk  04/25/2017  Falls in the past year? No   Functional Status Survey:    Vitals:   01/16/18 1135  BP: 134/77  Pulse: 62  Resp: 18  Temp: (!) 96.8 F (36  C)  TempSrc: Oral  SpO2: 98%  Weight: 109 lb 3.2 oz (49.5 kg)  Height: 5' 5.5" (1.664 m)   Body mass index is 17.9 kg/m. Physical Exam  Constitutional: She appears well-developed and well-nourished.  HENT:  Head: Normocephalic.  Mouth/Throat: Oropharynx is clear and moist.  Eyes: Pupils are equal, round, and reactive to light.  Neck: Neck supple.  Cardiovascular: Normal rate and regular rhythm.  Pulmonary/Chest: Effort normal and breath sounds normal.  Abdominal: Soft. Bowel sounds are normal.  Musculoskeletal: She exhibits no edema.  Neurological: She is alert.    Skin: Skin is warm and dry.  Psychiatric: She has a normal mood and affect. Her behavior is normal.    Labs reviewed: Recent Labs    06/13/17 1520 08/05/17 0728 11/11/17 0700  NA 138 138 137  K 4.6 3.7 3.8  CL 104 106 104  CO2 26 24 22   GLUCOSE 98 92 93  BUN 32* 29* 27*  CREATININE 1.21* 1.18* 1.13*  CALCIUM 9.8 9.4 9.2   Recent Labs    06/13/17 1300 11/11/17 0700  AST 22 19  ALT 12* 11*  ALKPHOS 86 67  BILITOT 0.5 0.7  PROT 7.6 6.2*  ALBUMIN 4.0 3.2*   Recent Labs    06/13/17 1300 08/05/17 0728 11/11/17 0700  WBC 5.3 4.5 4.5  NEUTROABS 3.7 2.3 2.5  HGB 10.5* 9.6* 9.3*  HCT 32.2* 29.5* 28.4*  MCV 99.1 99.0 99.3  PLT 221 223 216   Lab Results  Component Value Date   TSH 2.867 01/06/2017   No results found for: HGBA1C No results found for: CHOL, HDL, LDLCALC, LDLDIRECT, TRIG, CHOLHDL  Significant Diagnostic Results in last 30 days:  No results found.  Assessment/Plan Essential hypertension Patient BP is Controlled on Amlodipine and Labetalol.  Stage 3 chronic kidney diseaseWith Hyperkalemia Potassium stable on Kayexalate Last Bmp done in 03/19 Creat stable Repeat Bmp   Anemia due to stage 3 chronic kidney disease On Iron Supplement. Hgb Stable No Aggressive Work up  GERD On Zantac.  Dementia Stable on Supportive care and tolerating Aricept Age related Osteoporosis Tscore was - 2.8 Will Now Start on Fosamax. Were waiting for Dentist to make sure she does not need Tooth Extraction   Family/ staff Communication:   Labs/tests ordered:   Total time spent in this patient care encounter was 25_ minutes; greater than 50% of the visit spent counseling patient, reviewing records , Labs and coordinating care for problems addressed at this encounter.

## 2018-01-31 ENCOUNTER — Encounter (HOSPITAL_COMMUNITY)
Admission: RE | Admit: 2018-01-31 | Discharge: 2018-01-31 | Disposition: A | Discharge status: Home / Self Care | Payer: Medicare Other | Source: Skilled Nursing Facility | Attending: Internal Medicine | Admitting: Internal Medicine

## 2018-01-31 DIAGNOSIS — D631 Anemia in chronic kidney disease: Secondary | ICD-10-CM | POA: Diagnosis not present

## 2018-01-31 DIAGNOSIS — M6281 Muscle weakness (generalized): Secondary | ICD-10-CM | POA: Insufficient documentation

## 2018-01-31 DIAGNOSIS — K219 Gastro-esophageal reflux disease without esophagitis: Secondary | ICD-10-CM | POA: Diagnosis not present

## 2018-01-31 DIAGNOSIS — I129 Hypertensive chronic kidney disease with stage 1 through stage 4 chronic kidney disease, or unspecified chronic kidney disease: Secondary | ICD-10-CM | POA: Insufficient documentation

## 2018-01-31 DIAGNOSIS — N189 Chronic kidney disease, unspecified: Secondary | ICD-10-CM | POA: Diagnosis not present

## 2018-01-31 DIAGNOSIS — F0390 Unspecified dementia without behavioral disturbance: Secondary | ICD-10-CM | POA: Diagnosis not present

## 2018-01-31 DIAGNOSIS — I10 Essential (primary) hypertension: Secondary | ICD-10-CM | POA: Diagnosis present

## 2018-01-31 LAB — BASIC METABOLIC PANEL
ANION GAP: 6 (ref 5–15)
BUN: 33 mg/dL — ABNORMAL HIGH (ref 6–20)
CALCIUM: 9.5 mg/dL (ref 8.9–10.3)
CO2: 25 mmol/L (ref 22–32)
Chloride: 108 mmol/L (ref 101–111)
Creatinine, Ser: 1.16 mg/dL — ABNORMAL HIGH (ref 0.44–1.00)
GFR, EST AFRICAN AMERICAN: 45 mL/min — AB (ref >60)
GFR, EST NON AFRICAN AMERICAN: 39 mL/min — AB (ref >60)
Glucose, Bld: 84 mg/dL (ref 65–99)
Potassium: 4.6 mmol/L (ref 3.5–5.1)
Sodium: 139 mmol/L (ref 135–145)

## 2018-01-31 LAB — CBC
HCT: 30.3 % — ABNORMAL LOW (ref 36.0–46.0)
Hemoglobin: 9.8 g/dL — ABNORMAL LOW (ref 12.0–15.0)
MCH: 32.3 pg (ref 26.0–34.0)
MCHC: 32.3 g/dL (ref 30.0–36.0)
MCV: 100 fL (ref 78.0–100.0)
PLATELETS: 220 10*3/uL (ref 150–400)
RBC: 3.03 MIL/uL — ABNORMAL LOW (ref 3.87–5.11)
RDW: 13.8 % (ref 11.5–15.5)
WBC: 4.8 10*3/uL (ref 4.0–10.5)

## 2018-03-06 ENCOUNTER — Encounter: Payer: Self-pay | Admitting: Internal Medicine

## 2018-03-06 ENCOUNTER — Non-Acute Institutional Stay (SKILLED_NURSING_FACILITY): Payer: Medicare Other | Admitting: Internal Medicine

## 2018-03-06 DIAGNOSIS — N183 Chronic kidney disease, stage 3 unspecified: Secondary | ICD-10-CM

## 2018-03-06 DIAGNOSIS — F0390 Unspecified dementia without behavioral disturbance: Secondary | ICD-10-CM

## 2018-03-06 DIAGNOSIS — M810 Age-related osteoporosis without current pathological fracture: Secondary | ICD-10-CM

## 2018-03-06 DIAGNOSIS — D631 Anemia in chronic kidney disease: Secondary | ICD-10-CM

## 2018-03-06 DIAGNOSIS — I10 Essential (primary) hypertension: Secondary | ICD-10-CM

## 2018-03-06 DIAGNOSIS — E875 Hyperkalemia: Secondary | ICD-10-CM | POA: Diagnosis not present

## 2018-03-06 NOTE — Progress Notes (Signed)
Location:   [facility] Room Number: 109/W Place of Service:  SNF [phone]) Provider:  Freddi Starr, MD  Patient Care Team: [name], MD as PCP - General (Internal Medicine)  Extended Emergency Contact Information Primary Emergency Contact: [place],[name] Address: [address]          [place], [place] [address] [place] Phone: [phone] Relation: Other Secondary Emergency Contact: [name], [place] [address] [name] of [place] Phone: [phone] Mobile Phone: [phone] Relation: Niece  Code Status:  Full Code Goals of care: Advanced Directive information Advanced Directives 03/06/2018  Does Patient Have a Medical Advance Directive? Yes  Type of Advance Directive (No Data)  Does patient want to make changes to medical advance directive? No - Patient declined  Copy of [place] in Chart? No - copy requested  Pre-existing out of facility DNR order (yellow form or pink MOST form) -     Chief Complaint  Patient presents with  . Medical Management of Chronic Issues    Patient is being seen for Medical Management of Chronic Issues Routine Visit  - Of chronic diseases including dementia- chronic kidney disease with associated anemia- as well as hyperkalemia- hypertension- osteoporosis  HPI:  Pt is a 84 y.o. female seen today for medical management of chronic diseases.  As noted above  She continues to have a..period of stability.  Nursing does not report any recent issues Her weight is stable at 109 pounds  Regards to dementia she is on Aricept and appears to be doing well with this.  She also has a history of chronic renal disease but this appears stable creatinine was 1.16 is relatively baseline-she does have some associated hyperkalemia at times but is on Kayexalate and this is been well controlled now for an extended period of time.  Potassium was 4.6 on lab  done last month  She also has associated anemia hemoglobin has been stable at 9.8 on June lab   Regards to hypertension she is on labetalol and Norvasc I do not see consistent elevations recent readings range from 109/56- 153/80  Currently she is willing about in her wheelchair at baseline does participate in activities actually was in bingo this afternoon  She does not have any complaints- at times her systolic blood pressure is mildly elevated--usually associated with activity-she  has just finished wheeling down the hall and blood pressure is 150/82       Past Medical History:  Diagnosis Date  . Anemia   . Anxiety   . Dementia   . Dysphagia, oropharyngeal phase   . GERD (gastroesophageal reflux disease)   . Hyperpotassemia   . Hyperpotassemia   . Hypertension   . Other severe protein-calorie malnutrition    Past Surgical History:  Procedure Laterality Date  . ABDOMINAL HYSTERECTOMY    . APPENDECTOMY      No Known Allergies  Outpatient Encounter Medications as of 03/06/2018  Medication Sig  . amLODipine (NORVASC) 5 MG tablet Take 5 mg by mouth daily. For HTN  . denosumab (PROLIA) 60 MG/ML SOSY injection Inject 60 mg into the skin every 6 (six) months.  . donepezil (ARICEPT) 5 MG tablet Take 5 mg by mouth daily.   . Ferrous Sulfate (IRON) 325 (65 Fe) MG TABS Take 1 tablet by mouth once a day  . labetalol (NORMODYNE) 200 MG tablet Take 200 mg by  mouth 2 (two) times daily. For HTN  . ranitidine (ZANTAC) 150 MG tablet Take 150 mg by mouth daily.  . sodium polystyrene (KAYEXALATE) 15 GM/60ML suspension Take 15 g by mouth once. Once a day on Sun,Tue,Thu   No facility-administered encounter medications on file as of 03/06/2018.      Review of Systems   In general she is not complaining of fever chills her weight appears to be stable  Skin does not complain of rashes or itching  Head ears eyes nose mouth and throat has prescription lenses does not complain of visual  changes or sore throat   Respiratory is not complaining of any shortness of breath or cough   Cardiac is not complaining of chest pain does not have significant lower extremity edema   GI does not complain of abdominal pain nausea vomiting diarrhea or constipation   GU does not complain of any dysuria    Musculoskeletal does not complain of joint pain or joint swelling   Neurologic does not complain of dizziness headache numbness or syncope   Psych does not complain of anxiety or depression apparently at night does get somewhat anxious at times wanting to go home but can be redirected  Immunization History  Administered Date(s) Administered  . Influenza-Unspecified 05/23/2014, 05/18/2016, 05/20/2017  . Pneumococcal Conjugate-13 11/07/2015  . Pneumococcal-Unspecified 05/25/2016  . Tdap 04/28/2017   Pertinent  Health Maintenance Due  Topic Date Due  . INFLUENZA VACCINE  03/16/2018  . DEXA SCAN  Completed  . PNA vac Low Risk Adult  Completed   Fall Risk  04/25/2017  Falls in the past year? No   Functional Status Survey:    Vitals:   03/06/18 1223  BP: (!) 153/80  Pulse: 80  Resp: 18  Temp: 97.9 F (36.6 C)  TempSrc: Oral  SpO2: 98%  Weight: 109 lb (49.4 kg)  Height: 5' 5.5" (1.664 m)   Body mass index is 17.86 kg/m. Physical Exam  General patient is a somewhat frail but vigorous elderly female in no distress sitting comfortably in her wheelchair  Skin is warm and dry.  Oropharynx is clear mucous membranes moist she has numerous extractions- some decay evident left upper mouth  Eyes she has prescription lenses visual acuity appears to be intact   Chest is clear to auscultation there is no labored breathing  Heart is regular rate and rhythm without murmur gallop or rub she does not have significant lower extremity edema   Abdomen is soft nontender with positive bowel sounds  Musculoskeletal moves all extremities at baseline strength appears to be  intact she ambulates very well in her wheelchair   Neurologic is grossly intact her speech is clear no lateralizing findings   Psych she is oriented to self can carry on a short conversation- 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    Labs reviewed: Recent Labs    08/05/17 0728 11/11/17 0700 01/31/18 0715  NA 138 137 139  K 3.7 3.8 4.6  CL 106 104 108  CO2 24 22 25   GLUCOSE 92 93 84  BUN 29* 27* 33*  CREATININE 1.18* 1.13* 1.16*  CALCIUM 9.4 9.2 9.5   Recent Labs    06/13/17 1300 11/11/17 0700  AST 22 19  ALT 12* 11*  ALKPHOS 86 67  BILITOT 0.5 0.7  PROT 7.6 6.2*  ALBUMIN 4.0 3.2*   Recent Labs    06/13/17 1300 08/05/17 0728 11/11/17 0700 01/31/18 0715  WBC 5.3 4.5 4.5 4.8  NEUTROABS 3.7 2.3 2.5  --   HGB 10.5* 9.6* 9.3* 9.8*  HCT 32.2* 29.5* 28.4* 30.3*  MCV 99.1 99.0 99.3 100.0  PLT 221 223 216 220   Lab Results  Component Value Date   TSH 2.867 01/06/2017   No results found for: HGBA1C No results found for: CHOL, HDL, LDLCALC, LDLDIRECT, TRIG, CHOLHDL  Significant Diagnostic Results in last 30 days:  No results found.  Assessment/Plan  #1 dementia- weight appears to be stable she is doing well with supportive care is on low-dose Aricept  #2 history of chronic kidney disease this appears stable as noted above with a creatinine of 1.16 on June lab- she also has hyperkalemia but this is well controlled on Kayexalate with most recent potassium 4.6  #3- anemia of chronic disease-this is stable as well with hemoglobin of 9.8 on lab done last month.  4.-Hypertension there are variable blood pressures but I do not see consistent highs she is on labetalol as well as Norvasc.-  At this -Point will monitor and seems with activity her blood pressure goes up somewhat   5 osteoporosis she is on Prolia T score was -2.8  [id number]

## 2018-04-04 ENCOUNTER — Encounter: Payer: Self-pay | Admitting: Internal Medicine

## 2018-04-04 ENCOUNTER — Non-Acute Institutional Stay (SKILLED_NURSING_FACILITY): Payer: Medicare Other | Admitting: Internal Medicine

## 2018-04-04 DIAGNOSIS — M810 Age-related osteoporosis without current pathological fracture: Secondary | ICD-10-CM | POA: Diagnosis not present

## 2018-04-04 DIAGNOSIS — F0390 Unspecified dementia without behavioral disturbance: Secondary | ICD-10-CM | POA: Diagnosis not present

## 2018-04-04 DIAGNOSIS — I10 Essential (primary) hypertension: Secondary | ICD-10-CM | POA: Diagnosis not present

## 2018-04-04 DIAGNOSIS — E875 Hyperkalemia: Secondary | ICD-10-CM

## 2018-04-04 DIAGNOSIS — D631 Anemia in chronic kidney disease: Secondary | ICD-10-CM

## 2018-04-04 DIAGNOSIS — N183 Chronic kidney disease, stage 3 (moderate): Secondary | ICD-10-CM

## 2018-04-04 NOTE — Progress Notes (Signed)
Location:   [facility] Room Number: S505L Place of Service:  SNF [phone]) Provider:  Freddi Starr, MD  Patient Care Team: [name], MD as PCP - General (Internal Medicine)  Extended Emergency Contact Information Primary Emergency Contact: [place],[name] Address: [address]          [place], [place] [address] [place] Phone: [phone] Relation: Other Secondary Emergency Contact: [name], [place] [address] [name] of [place] Phone: [phone] Mobile Phone: [phone] Relation: Niece  Code Status:  Full Code Goals of care: Advanced Directive information Advanced Directives 03/06/2018  Does Patient Have a Medical Advance Directive? Yes  Type of Advance Directive (No Data)  Does patient want to make changes to medical advance directive? No - Patient declined  Copy of [place] in Chart? No - copy requested  Pre-existing out of facility DNR order (yellow form or pink MOST form) -     Chief Complaint  Patient presents with  . Medical Management of Chronic Issues    Medical Management of Chronic Issues  Medical issues include dementia- chronic kidney disease with hyperkalemia-anemia of chronic disease-as well as hypertension and osteoporosis  HPI:  Pt is a 84 y.o. female seen today for Medical Management of Chronic issues as noted above.  Morgan [name] has been quite stable for an extended period of time and this continues.  Her weight appears to be stable at around 107 pounds --[place] is a picky eater --but weight appears to be stable.  In regards to dementia she continues on Aricept-nursing does not report any acute behaviors at times especially later in the day she will get somewhat agitated at times but can be easily redirected- her agitation usually involves statements that she wants to go home and she needs somebody to pick her up  -She does have a history of anemia with  chronic kidney disease but this is been stable with a hemoglobin of 9.8 on lab done in June-creatinine is 1.16 which also shows stability- will update this.  At time she has had some mild hyperkalemia and she is on Kayexalate 3 days a week-last potassium level was 4.6 in June will update this as well.  Regards to hypertension she continues on Norvasc and Lopressor she has some variations but I do not see consistent elevations current blood pressures 123/66.  She is now sitting in her wheelchair comfortably has no complaints continues to be bright alert somewhat but confused but very conversational.     Past Medical History:  Diagnosis Date  . Anemia   . Anxiety   . Dementia   . Dysphagia, oropharyngeal phase   . GERD (gastroesophageal reflux disease)   . Hyperpotassemia   . Hyperpotassemia   . Hypertension   . Other severe protein-calorie malnutrition    Past Surgical History:  Procedure Laterality Date  . ABDOMINAL HYSTERECTOMY    . APPENDECTOMY      No Known Allergies  Outpatient Encounter Medications as of 04/04/2018  Medication Sig  . amLODipine (NORVASC) 5 MG tablet Take 5 mg by mouth daily. For HTN  . denosumab (PROLIA) 60 MG/ML SOSY injection Inject 60 mg into the skin every 6 (six) months.  . donepezil (ARICEPT) 5 MG tablet Take 5 mg by mouth daily.   . feeding supplement, GLUCERNA SHAKE, (GLUCERNA SHAKE) LIQD Take 237 mLs by mouth 2 (two) times daily between meals.  Marland Kitchen  Ferrous Sulfate (IRON) 325 (65 Fe) MG TABS Take 1 tablet by mouth once a day  . labetalol (NORMODYNE) 200 MG tablet Take 200 mg by mouth 2 (two) times daily. For HTN  . ranitidine (ZANTAC) 150 MG tablet Take 150 mg by mouth daily.  . sodium polystyrene (KAYEXALATE) 15 GM/60ML suspension Take 15 g by mouth once. Once a day on Sun,Tue,Thu   No facility-administered encounter medications on file as of 04/04/2018.     Review of Systems   In general she is not complaining of fever chills her weight is  stable.  Skin does not complain of rashes or itching she does have numerous seborrheic keratoses  Head ears eyes nose mouth and throat has prescription lenses does not complain of visual changes or sore throat.  Story does not complain of having cough or shortness of breath.  Cardiac is not complaining of chest pain or significant edema.  GI is not complaining of any abdominal discomfort nausea vomiting diarrhea constipation.  GU does not complain of dysuria.  Musculoskeletal continues to ambulate quite well in her wheelchair-she is not complaining of joint pain.  Neurologic does not complain of feeling dizzy or having headaches.  Psych does not complain of being depressed or anxious at times will have some behaviors later in the day but can again be easily redirected  Immunization History  Administered Date(s) Administered  . Influenza-Unspecified 05/23/2014, 05/18/2016, 05/20/2017  . Pneumococcal Conjugate-13 11/07/2015  . Pneumococcal-Unspecified 05/25/2016  . Tdap 04/28/2017   Pertinent  Health Maintenance Due  Topic Date Due  . INFLUENZA VACCINE  03/16/2018  . DEXA SCAN  Completed  . PNA vac Low Risk Adult  Completed   Fall Risk  04/25/2017  Falls in the past year? No   Functional Status Survey:    Vitals:   04/04/18 1420  BP: 122/66  Pulse: 67  Resp: 18  Temp: (!) 97.5 F (36.4 C)  TempSrc: Oral  Weight: 107 lb (48.5 kg)  Height: 5' 5.5" (1.664 m)   Body mass index is 17.53 kg/m. Physical Exam  Wt:107, BP 122/66, P: 67, RR:18, T:97.5 Ht:5'5.5"  In general this is a pleasant elderly female in no distress sitting comfortably in her wheelchair she appears well-nourished.  Her skin is warm and dry.  Eyes visual acuity appears intact she has prescription lenses.  Sclera and conjunctive are clear.  Oropharynx is clear mucous membranes moist she has numerous extractions  Chest is clear to auscultation there is no labored breathing.  Heart is regular  rate and rhythm without murmur gallop or rub she does not have significant lower extremity edema.  Abdomen is soft nontender with positive bowel sounds.  Muscular skeletal moves all extremities x4 at baseline she does very well ambulating in her wheelchair strength appears to be intact all 4 extremities.  Neurologic is grossly intact her speech is clear no lateralizing findings.  Cranial nerves intact.  Psych she is oriented to self is pleasant cooperative alert can carry on a short conversation-at times when she is reading the paper can actually give me a short description of what she is reading    Labs reviewed: Recent Labs    08/05/17 0728 11/11/17 0700 01/31/18 0715  NA 138 137 139  K 3.7 3.8 4.6  CL 106 104 108  CO2 24 22 25   GLUCOSE 92 93 84  BUN 29* 27* 33*  CREATININE 1.18* 1.13* 1.16*  CALCIUM 9.4 9.2 9.5   Recent Labs  06/13/17 1300 11/11/17 0700  AST 22 19  ALT 12* 11*  ALKPHOS 86 67  BILITOT 0.5 0.7  PROT 7.6 6.2*  ALBUMIN 4.0 3.2*   Recent Labs    06/13/17 1300 08/05/17 0728 11/11/17 0700 01/31/18 0715  WBC 5.3 4.5 4.5 4.8  NEUTROABS 3.7 2.3 2.5  --   HGB 10.5* 9.6* 9.3* 9.8*  HCT 32.2* 29.5* 28.4* 30.3*  MCV 99.1 99.0 99.3 100.0  PLT 221 223 216 220   Lab Results  Component Value Date   TSH 2.867 01/06/2017   No results found for: HGBA1C No results found for: CHOL, HDL, LDLCALC, LDLDIRECT, TRIG, CHOLHDL  Significant Diagnostic Results in last 30 days:  No results found.  Assessment/Plan  #1- history of dementia she continues on Aricept she is doing quite well with supportive care weight appears to be relatively stable she continues on Glucerna supplementation.  2.-History of chronic kidney disease-creatinine appears stable at 1.16-will update this.  This is complicated somewhat with mild hyperkalemia as well which is why she is on Kayexalate 3 days a week- potassium level has been stable for some time on the Kayexalate- most  recently 4.6 in June-will update this.  3.  Anemia with history of chronic disease-she is on iron hemoglobin has shown stability at 9.8 will have this updated as well.  4.  History of hypertension- she continues on Norvasc and lisinopril- she continues to have some variations but I do not see consistent elevations-I suspect at times agitation may contribute to higher blood pressures- at this point monitor  #5 osteoporosis history her T score on DEXA scan was -2.8 and she is on Prolia appears to be tolerating this well.  Again will update a CBC as well as a metabolic panel to keep an eye on her hemoglobin as well as renal function and potassium levels.  [id number].      Granville Lewis, PA-C [phone]

## 2018-04-05 ENCOUNTER — Encounter (HOSPITAL_COMMUNITY)
Admission: RE | Admit: 2018-04-05 | Discharge: 2018-04-05 | Disposition: A | Discharge status: Home / Self Care | Payer: Medicare Other | Source: Skilled Nursing Facility | Attending: Internal Medicine | Admitting: Internal Medicine

## 2018-04-05 DIAGNOSIS — M6281 Muscle weakness (generalized): Secondary | ICD-10-CM | POA: Diagnosis present

## 2018-04-05 DIAGNOSIS — I129 Hypertensive chronic kidney disease with stage 1 through stage 4 chronic kidney disease, or unspecified chronic kidney disease: Secondary | ICD-10-CM | POA: Insufficient documentation

## 2018-04-05 DIAGNOSIS — F0390 Unspecified dementia without behavioral disturbance: Secondary | ICD-10-CM | POA: Diagnosis present

## 2018-04-05 LAB — BASIC METABOLIC PANEL
Anion gap: 7 (ref 5–15)
BUN: 26 mg/dL — ABNORMAL HIGH (ref 8–23)
CO2: 24 mmol/L (ref 22–32)
CREATININE: 1.16 mg/dL — AB (ref 0.44–1.00)
Calcium: 8.8 mg/dL — ABNORMAL LOW (ref 8.9–10.3)
Chloride: 106 mmol/L (ref 98–111)
GFR calc non Af Amer: 39 mL/min — ABNORMAL LOW (ref >60)
GFR, EST AFRICAN AMERICAN: 45 mL/min — AB (ref >60)
Glucose, Bld: 86 mg/dL (ref 70–99)
Potassium: 4.3 mmol/L (ref 3.5–5.1)
SODIUM: 137 mmol/L (ref 135–145)

## 2018-04-05 LAB — CBC WITH DIFFERENTIAL/PLATELET
BASOS PCT: 0 %
Basophils Absolute: 0 10*3/uL (ref 0.0–0.1)
Eosinophils Absolute: 0.2 10*3/uL (ref 0.0–0.7)
Eosinophils Relative: 4 %
HCT: 29.4 % — ABNORMAL LOW (ref 36.0–46.0)
Hemoglobin: 9.7 g/dL — ABNORMAL LOW (ref 12.0–15.0)
Lymphocytes Relative: 35 %
Lymphs Abs: 1.6 10*3/uL (ref 0.7–4.0)
MCH: 32.7 pg (ref 26.0–34.0)
MCHC: 33 g/dL (ref 30.0–36.0)
MCV: 99 fL (ref 78.0–100.0)
MONOS PCT: 14 %
Monocytes Absolute: 0.6 10*3/uL (ref 0.1–1.0)
NEUTROS PCT: 47 %
Neutro Abs: 2.2 10*3/uL (ref 1.7–7.7)
Platelets: 205 10*3/uL (ref 150–400)
RBC: 2.97 MIL/uL — ABNORMAL LOW (ref 3.87–5.11)
RDW: 13.4 % (ref 11.5–15.5)
WBC: 4.6 10*3/uL (ref 4.0–10.5)

## 2018-04-27 ENCOUNTER — Non-Acute Institutional Stay (SKILLED_NURSING_FACILITY): Payer: Medicare Other | Admitting: Internal Medicine

## 2018-04-27 ENCOUNTER — Encounter: Payer: Self-pay | Admitting: Internal Medicine

## 2018-04-27 DIAGNOSIS — M810 Age-related osteoporosis without current pathological fracture: Secondary | ICD-10-CM | POA: Diagnosis not present

## 2018-04-27 DIAGNOSIS — D631 Anemia in chronic kidney disease: Secondary | ICD-10-CM

## 2018-04-27 DIAGNOSIS — N183 Chronic kidney disease, stage 3 unspecified: Secondary | ICD-10-CM

## 2018-04-27 DIAGNOSIS — E875 Hyperkalemia: Secondary | ICD-10-CM | POA: Diagnosis not present

## 2018-04-27 DIAGNOSIS — I10 Essential (primary) hypertension: Secondary | ICD-10-CM

## 2018-04-27 DIAGNOSIS — F0390 Unspecified dementia without behavioral disturbance: Secondary | ICD-10-CM

## 2018-04-27 NOTE — Progress Notes (Signed)
Location:    [facility] Room Number: 109/W Place of Service:  SNF [phone]) Provider:  Veleta Miners MD  [name], MD  Patient Care Team: [name], MD as PCP - General (Internal Medicine)  Extended Emergency Contact Information Primary Emergency Contact: [name] Address: [address]          [place], [place] [address] [place] Phone: [phone] Relation: Other Secondary Emergency Contact: [name], [place] [address] [name] of [place] Phone: [phone] Mobile Phone: [phone] Relation: Niece  Code Status:  DNR Goals of care: Advanced Directive information Advanced Directives 04/27/2018  Does Patient Have a Medical Advance Directive? Yes  Type of Advance Directive (No Data)  Does patient want to make changes to medical advance directive? No - Patient declined  Copy of [place] in Chart? No - copy requested  Pre-existing out of facility DNR order (yellow form or pink MOST form) -     Chief Complaint  Patient presents with  . Medical Management of Chronic Issues    Patient is being seen for routine visist of medical management    HPI:  Pt is a 84 y.o. female seen today for medical management of chronic diseases.   Patient has h/o Hyperkalemia secondary to Chronic renal insufficiency, hypertension, anemia secondary to renal diseaseAnd dementia   Patient has been stable in the facility. Her weight is 102 lbs slightly less then 109 lbs few months ago. She gets confused mostly in the evening but responds to Redirection Usually stays Wheelchair Bound Walks with Max assist. No New Nursing Issues. Patient had No new Complains.   Past Medical History:  Diagnosis Date  . Anemia   . Anxiety   . Dementia   . Dysphagia, oropharyngeal phase   . GERD (gastroesophageal reflux disease)   . Hyperpotassemia   . Hyperpotassemia   . Hypertension   . Other severe  protein-calorie malnutrition    Past Surgical History:  Procedure Laterality Date  . ABDOMINAL HYSTERECTOMY    . APPENDECTOMY      No Known Allergies  Outpatient Encounter Medications as of 04/27/2018  Medication Sig  . amLODipine (NORVASC) 5 MG tablet Take 5 mg by mouth daily. For HTN  . denosumab (PROLIA) 60 MG/ML SOSY injection Inject 60 mg into the skin every 6 (six) months.  . donepezil (ARICEPT) 5 MG tablet Take 5 mg by mouth daily.   . feeding supplement, GLUCERNA SHAKE, (GLUCERNA SHAKE) LIQD Take 237 mLs by mouth 2 (two) times daily between meals.  . Ferrous Sulfate (IRON) 325 (65 Fe) MG TABS Take 1 tablet by mouth once a day  . labetalol (NORMODYNE) 200 MG tablet Take 200 mg by mouth 2 (two) times daily. For HTN  . ranitidine (ZANTAC) 150 MG tablet Take 150 mg by mouth daily.  . sodium polystyrene (KAYEXALATE) 15 GM/60ML suspension Take 15 g by mouth once. Once a day on Sun,Tue,Thu   No facility-administered encounter medications on file as of 04/27/2018.      Review of Systems  Unable to perform ROS: Dementia    Immunization History  Administered Date(s) Administered  . Influenza-Unspecified 05/23/2014, 05/18/2016, 05/20/2017  . Pneumococcal Conjugate-13 11/07/2015  . Pneumococcal-Unspecified 05/25/2016  . Tdap 04/28/2017   Pertinent  Health Maintenance Due  Topic Date Due  . INFLUENZA VACCINE  05/27/2018 (Originally 03/16/2018)  . DEXA SCAN  Completed  . PNA vac  Low Risk Adult  Completed   Fall Risk  04/25/2017  Falls in the past year? No   Functional Status Survey:    Vitals:   04/27/18 1239  BP: 118/67  Pulse: 81  Resp: 17  Temp: (!) 97.3 F (36.3 C)  TempSrc: Oral  SpO2: 98%  Weight: 102 lb 3.2 oz (46.4 kg)  Height: 5' 5.5" (1.664 m)   Body mass index is 16.75 kg/m. Physical Exam  Constitutional: She appears well-developed and well-nourished.  HENT:  Head: Normocephalic.  Mouth/Throat: Oropharynx is clear and moist.  Poor Oral Hygeine    Eyes: Pupils are equal, round, and reactive to light.  Neck: Neck supple.  Cardiovascular: Normal rate and regular rhythm.  Pulmonary/Chest: Effort normal and breath sounds normal.  Abdominal: Soft. Bowel sounds are normal.  Musculoskeletal: She exhibits no edema.  Neurological: She is alert.  Not Oriented Responds Appropriately . Follows most Commands  Skin: Skin is warm and dry.  Psychiatric: She has a normal mood and affect. Her behavior is normal.    Labs reviewed: Recent Labs    11/11/17 0700 01/31/18 0715 04/05/18 0700  NA 137 139 137  K 3.8 4.6 4.3  CL 104 108 106  CO2 22 25 24   GLUCOSE 93 84 86  BUN 27* 33* 26*  CREATININE 1.13* 1.16* 1.16*  CALCIUM 9.2 9.5 8.8*   Recent Labs    06/13/17 1300 11/11/17 0700  AST 22 19  ALT 12* 11*  ALKPHOS 86 67  BILITOT 0.5 0.7  PROT 7.6 6.2*  ALBUMIN 4.0 3.2*   Recent Labs    08/05/17 0728 11/11/17 0700 01/31/18 0715 04/05/18 0700  WBC 4.5 4.5 4.8 4.6  NEUTROABS 2.3 2.5  --  2.2  HGB 9.6* 9.3* 9.8* 9.7*  HCT 29.5* 28.4* 30.3* 29.4*  MCV 99.0 99.3 100.0 99.0  PLT 223 216 220 205   Lab Results  Component Value Date   TSH 2.867 01/06/2017   No results found for: HGBA1C No results found for: CHOL, HDL, LDLCALC, LDLDIRECT, TRIG, CHOLHDL  Significant Diagnostic Results in last 30 days:  No results found.  Assessment/Plan Essential hypertension BP Stable on Norvasc and Labetolol  Age-related osteoporosis  On Prolia  Tscore was - 2.8  Stage 3 chronic kidney disease  Creat Stable  Hyperkalemia On Kayexalate  Anemia due to stage 3 chronic kidney disease  Hgb Stable On iron   Dementia  On Aricept   Family/ staff Communication:   Labs/tests ordered:

## 2018-05-16 ENCOUNTER — Non-Acute Institutional Stay (SKILLED_NURSING_FACILITY): Payer: Medicare Other

## 2018-05-16 DIAGNOSIS — Z0000 Encounter for general adult medical examination without abnormal findings: Secondary | ICD-10-CM

## 2018-05-16 NOTE — Patient Instructions (Signed)
Morgan [name] , Thank you for taking time to come for your Medicare Wellness Visit. I appreciate your ongoing commitment to your health goals. Please review the following plan we discussed and let me know if I can assist you in the future.   Screening recommendations/referrals: Colonoscopy excluded, over age 82 Mammogram excluded, over age 39 Bone Density up to date Recommended yearly ophthalmology/optometry visit for glaucoma screening and checkup Recommended yearly dental visit for hygiene and checkup  Vaccinations: Influenza vaccine due, will receive at [facility] Pneumococcal vaccine up to date, completed Tdap vaccine up to date, due Shingles vaccine not in past records    Advanced directives: In chart  Conditions/risks identified: none  Next appointment: Dr. Lyndel Safe makes rounds   Preventive Care 65 Years and Older, Female Preventive care refers to lifestyle choices and visits with your health care provider that can promote health and wellness. What does preventive care include?  A yearly physical exam. This is also called an annual well check.  Dental exams once or twice a year.  Routine eye exams. Ask your health care provider how often you should have your eyes checked.  Personal lifestyle choices, including:  Daily care of your teeth and gums.  Regular physical activity.  Eating a healthy diet.  Avoiding tobacco and drug use.  Limiting alcohol use.  Practicing safe sex.  Taking low-dose aspirin every day.  Taking vitamin and mineral supplements as recommended by your health care provider. What happens during an annual well check? The services and screenings done by your health care provider during your annual well check will depend on your age, overall health, lifestyle risk factors, and family history of disease. Counseling  Your health care provider may ask you questions about your:  Alcohol use.  Tobacco use.  Drug use.  Emotional well-being.  Home and  relationship well-being.  Sexual activity.  Eating habits.  History of falls.  Memory and ability to understand (cognition).  Work and work Statistician.  Reproductive health. Screening  You may have the following tests or measurements:  Height, weight, and BMI.  Blood pressure.  Lipid and cholesterol levels. These may be checked every 5 years, or more frequently if you are over 81 years old.  Skin check.  Lung cancer screening. You may have this screening every year starting at age 37 if you have a 30-pack-year history of smoking and currently smoke or have quit within the past 15 years.  Fecal occult blood test (FOBT) of the stool. You may have this test every year starting at age 35.  Flexible sigmoidoscopy or colonoscopy. You may have a sigmoidoscopy every 5 years or a colonoscopy every 10 years starting at age 10.  Hepatitis C blood test.  Hepatitis B blood test.  Sexually transmitted disease (STD) testing.  Diabetes screening. This is done by checking your blood sugar (glucose) after you have not eaten for a while (fasting). You may have this done every 1-3 years.  Bone density scan. This is done to screen for osteoporosis. You may have this done starting at age 67.  Mammogram. This may be done every 1-2 years. Talk to your health care provider about how often you should have regular mammograms. Talk with your health care provider about your test results, treatment options, and if necessary, the need for more tests. Vaccines  Your health care provider may recommend certain vaccines, such as:  Influenza vaccine. This is recommended every year.  Tetanus, diphtheria, and acellular pertussis (Tdap, Td) vaccine. You  may need a Td booster every 10 years.  Zoster vaccine. You may need this after age 50.  Pneumococcal 13-valent conjugate (PCV13) vaccine. One dose is recommended after age 56.  Pneumococcal polysaccharide (PPSV23) vaccine. One dose is recommended after  age 33. Talk to your health care provider about which screenings and vaccines you need and how often you need them. This information is not intended to replace advice given to you by your health care provider. Make sure you discuss any questions you have with your health care provider. Document Released: 08/29/2015 Document Revised: 04/21/2016 Document Reviewed: 06/03/2015 Elsevier Interactive Patient Education  2017 [place] Prevention in the Home Falls can cause injuries. They can happen to people of all ages. There are many things you can do to make your home safe and to help prevent falls. What can I do on the outside of my home?  Regularly fix the edges of walkways and driveways and fix any cracks.  Remove anything that might make you trip as you walk through a door, such as a raised step or threshold.  Trim any bushes or trees on the path to your home.  Use bright outdoor lighting.  Clear any walking paths of anything that might make someone trip, such as rocks or tools.  Regularly check to see if handrails are loose or broken. Make sure that both sides of any steps have handrails.  Any raised decks and porches should have guardrails on the edges.  Have any leaves, snow, or ice cleared regularly.  Use sand or salt on walking paths during winter.  Clean up any spills in your garage right away. This includes oil or grease spills. What can I do in the bathroom?  Use night lights.  Install grab bars by the toilet and in the tub and shower. Do not use towel bars as grab bars.  Use non-skid mats or decals in the tub or shower.  If you need to sit down in the shower, use a plastic, non-slip stool.  Keep the floor dry. Clean up any water that spills on the floor as soon as it happens.  Remove soap buildup in the tub or shower regularly.  Attach bath mats securely with double-sided non-slip rug tape.  Do not have throw rugs and other things on the floor that can  make you trip. What can I do in the bedroom?  Use night lights.  Make sure that you have a light by your bed that is easy to reach.  Do not use any sheets or blankets that are too big for your bed. They should not hang down onto the floor.  Have a firm chair that has side arms. You can use this for support while you get dressed.  Do not have throw rugs and other things on the floor that can make you trip. What can I do in the kitchen?  Clean up any spills right away.  Avoid walking on wet floors.  Keep items that you use a lot in easy-to-reach places.  If you need to reach something above you, use a strong step stool that has a grab bar.  Keep electrical cords out of the way.  Do not use floor polish or wax that makes floors slippery. If you must use wax, use non-skid floor wax.  Do not have throw rugs and other things on the floor that can make you trip. What can I do with my stairs?  Do not leave any items  on the stairs.  Make sure that there are handrails on both sides of the stairs and use them. Fix handrails that are broken or loose. Make sure that handrails are as long as the stairways.  Check any carpeting to make sure that it is firmly attached to the stairs. Fix any carpet that is loose or worn.  Avoid having throw rugs at the top or bottom of the stairs. If you do have throw rugs, attach them to the floor with carpet tape.  Make sure that you have a light switch at the top of the stairs and the bottom of the stairs. If you do not have them, ask someone to add them for you. What else can I do to help prevent falls?  Wear shoes that:  Do not have high heels.  Have rubber bottoms.  Are comfortable and fit you well.  Are closed at the toe. Do not wear sandals.  If you use a stepladder:  Make sure that it is fully opened. Do not climb a closed stepladder.  Make sure that both sides of the stepladder are locked into place.  Ask someone to hold it for you,  if possible.  Clearly mark and make sure that you can see:  Any grab bars or handrails.  First and last steps.  Where the edge of each step is.  Use tools that help you move around (mobility aids) if they are needed. These include:  Canes.  Walkers.  Scooters.  Crutches.  Turn on the lights when you go into a dark area. Replace any light bulbs as soon as they burn out.  Set up your furniture so you have a clear path. Avoid moving your furniture around.  If any of your floors are uneven, fix them.  If there are any pets around you, be aware of where they are.  Review your medicines with your doctor. Some medicines can make you feel dizzy. This can increase your chance of falling. Ask your doctor what other things that you can do to help prevent falls. This information is not intended to replace advice given to you by your health care provider. Make sure you discuss any questions you have with your health care provider. Document Released: 05/29/2009 Document Revised: 01/08/2016 Document Reviewed: 09/06/2014 Elsevier Interactive Patient Education  2017 [employer].

## 2018-05-16 NOTE — Progress Notes (Signed)
Subjective:   Morgan [name] is a 84 y.o. female who presents for Medicare Annual (Subsequent) preventive examination at [facility]  Last AWV-04/25/2017    Objective:     Vitals: BP 125/68 (BP Location: Left Arm, Patient Position: Sitting)   Pulse 68   Temp (!) 97.5 F (36.4 C) (Oral)   Ht 5\' 5"  (1.651 m)   Wt 102 lb (46.3 kg)   BMI 16.97 kg/m   Body mass index is 16.97 kg/m.  Advanced Directives 05/16/2018 04/27/2018 03/06/2018 01/16/2018 12/15/2017 12/05/2017 11/15/2017  Does Patient Have a Medical Advance Directive? Yes Yes Yes Yes Yes Yes Yes  Type of Advance Directive (No Data) (No Data) (No Data) (No Data) (No Data) (No Data) (No Data)  Does patient want to make changes to medical advance directive? No - Patient declined No - Patient declined No - Patient declined No - Patient declined No - Patient declined No - Patient declined No - Patient declined  Copy of [place] in Chart? No - copy requested No - copy requested No - copy requested No - copy requested No - copy requested No - copy requested No - copy requested  Pre-existing out of facility DNR order (yellow form or pink MOST form) - - - - - - -    Tobacco Social History   Tobacco Use  Smoking Status Never Smoker  Smokeless Tobacco Never Used     Counseling given: Not Answered   Clinical Intake:  Pre-visit preparation completed: No  Pain : No/denies pain     Diabetes: No  How often do you need to have someone help you when you read instructions, pamphlets, or other written materials from your doctor or pharmacy?: 3 - Sometimes What is the last grade level you completed in school?: bachelors  Interpreter Needed?: No  Information entered by :: Tyson Dense, Rn  Past Medical History:  Diagnosis Date  . Anemia   . Anxiety   . Dementia ([place])   . Dysphagia, oropharyngeal phase   . GERD (gastroesophageal reflux disease)   . Hyperpotassemia   . Hyperpotassemia   .  Hypertension   . Other severe protein-calorie malnutrition    Past Surgical History:  Procedure Laterality Date  . ABDOMINAL HYSTERECTOMY    . APPENDECTOMY     History reviewed. No pertinent family history. Social History   Socioeconomic History  . Marital status: Widowed    Spouse name: Not on file  . Number of children: Not on file  . Years of education: Not on file  . Highest education level: Not on file  Occupational History  . Not on file  Social Needs  . Financial resource strain: Not hard at all  . Food insecurity:    Worry: Never true    Inability: Never true  . Transportation needs:    Medical: No    Non-medical: No  Tobacco Use  . Smoking status: Never Smoker  . Smokeless tobacco: Never Used  Substance and Sexual Activity  . Alcohol use: No  . Drug use: No  . Sexual activity: Never  Lifestyle  . Physical activity:    Days per week: 0 days    Minutes per session: 0 min  . Stress: Not at all  Relationships  . Social connections:    Talks on phone: Never    Gets together: Once a week    Attends religious service: Never    Active member of club or organization:  No    Attends meetings of clubs or organizations: Never    Relationship status: Widowed  Other Topics Concern  . Not on file  Social History Narrative  . Not on file    Outpatient Encounter Medications as of 05/16/2018  Medication Sig  . amLODipine (NORVASC) 5 MG tablet Take 5 mg by mouth daily. For HTN  . denosumab (PROLIA) 60 MG/ML SOSY injection Inject 60 mg into the skin every 6 (six) months.  . donepezil (ARICEPT) 5 MG tablet Take 5 mg by mouth daily.   . feeding supplement, GLUCERNA SHAKE, (GLUCERNA SHAKE) LIQD Take 237 mLs by mouth 2 (two) times daily between meals.  . Ferrous Sulfate (IRON) 325 (65 Fe) MG TABS Take 1 tablet by mouth once a day  . labetalol (NORMODYNE) 200 MG tablet Take 200 mg by mouth 2 (two) times daily. For HTN  . ranitidine (ZANTAC) 150 MG tablet Take 150 mg by  mouth daily.  . sodium polystyrene (KAYEXALATE) 15 GM/60ML suspension Take 15 g by mouth once. Once a day on Sun,Tue,Thu   No facility-administered encounter medications on file as of 05/16/2018.     Activities of Daily Living In your present state of health, do you have any difficulty performing the following activities: 05/16/2018  Hearing? N  Vision? N  Difficulty concentrating or making decisions? Y  Walking or climbing stairs? Y  Dressing or bathing? Y  Doing errands, shopping? Y  Preparing Food and eating ? Y  Using the Toilet? Y  In the past six months, have you accidently leaked urine? Y  Do you have problems with loss of bowel control? Y  Managing your Medications? Y  Managing your Finances? Y  Housekeeping or managing your Housekeeping? Y  Some recent data might be hidden    Patient Care Team: [name], MD as PCP - General (Internal Medicine)    Assessment:   This is a routine wellness examination for [facility].  Exercise Activities and Dietary recommendations Current Exercise Habits: The patient does not participate in regular exercise at present, Exercise limited by: orthopedic condition(s);neurologic condition(s)  Goals   None     Fall Risk Fall Risk  05/16/2018 04/25/2017  Falls in the past year? No No   Is the patient's home free of loose throw rugs in walkways, pet beds, electrical cords, etc?   yes      Grab bars in the bathroom? yes      Handrails on the stairs?   yes      Adequate lighting?   yes  Depression Screen PHQ 2/9 Scores 05/16/2018 04/25/2017  PHQ - 2 Score 0 0     Cognitive Function     6CIT Screen 05/16/2018 04/25/2017  What Year? 4 points 4 points  What month? 3 points 0 points  What time? 0 points 0 points  Count back from 20 0 points 0 points  Months in reverse 4 points 0 points  Repeat phrase 6 points 10 points  Total Score 17 14    Immunization History  Administered Date(s) Administered  . Influenza-Unspecified  05/23/2014, 05/18/2016, 05/20/2017  . Pneumococcal Conjugate-13 11/07/2015  . Pneumococcal-Unspecified 05/25/2016  . Tdap 04/28/2017    Qualifies for Shingles Vaccine? Not in past records  Screening Tests Health Maintenance  Topic Date Due  . INFLUENZA VACCINE  05/27/2018 (Originally 03/16/2018)  . TETANUS/TDAP  04/29/2027  . DEXA SCAN  Completed  . PNA vac Low Risk Adult  Completed    Cancer Screenings:  Lung: Low Dose CT Chest recommended if Age 55-80 years, 30 pack-year currently smoking OR have quit w/in 15years. Patient does not qualify. Breast:  Up to date on Mammogram? Yes   Up to date of Bone Density/Dexa? Yes Colorectal: up to date  Additional Screenings:  Hepatitis C Screening: declined Flu vaccine due: will receive at [facility]:    I have personally reviewed and addressed the Medicare Annual Wellness questionnaire and have noted the following in the patient's chart:  A. Medical and social history B. Use of alcohol, tobacco or illicit drugs  C. Current medications and supplements D. Functional ability and status E.  Nutritional status F.  Physical activity G. Advance directives H. List of other physicians I.  Hospitalizations, surgeries, and ER visits in previous 12 months J.  [place] to include hearing, vision, cognitive, depression L. Referrals and appointments - none  In addition, I have reviewed and discussed with patient certain preventive protocols, quality metrics, and best practice recommendations. A written personalized care plan for preventive services as well as general preventive health recommendations were provided to patient.  See attached scanned questionnaire for additional information.   Signed,   Tyson Dense, RN Nurse Health Advisor  Patient Concerns: None

## 2018-06-01 ENCOUNTER — Non-Acute Institutional Stay (SKILLED_NURSING_FACILITY): Payer: Medicare Other | Admitting: Internal Medicine

## 2018-06-01 ENCOUNTER — Encounter: Payer: Self-pay | Admitting: Internal Medicine

## 2018-06-01 DIAGNOSIS — I10 Essential (primary) hypertension: Secondary | ICD-10-CM | POA: Diagnosis not present

## 2018-06-01 DIAGNOSIS — E875 Hyperkalemia: Secondary | ICD-10-CM

## 2018-06-01 DIAGNOSIS — N183 Chronic kidney disease, stage 3 unspecified: Secondary | ICD-10-CM

## 2018-06-01 DIAGNOSIS — F0390 Unspecified dementia without behavioral disturbance: Secondary | ICD-10-CM | POA: Diagnosis not present

## 2018-06-01 DIAGNOSIS — D631 Anemia in chronic kidney disease: Secondary | ICD-10-CM

## 2018-06-01 NOTE — Progress Notes (Signed)
Location:    [facility] Room Number: 109/W Place of Service:  SNF [phone]) Provider: Granville Lewis PA-C  [name], MD  Patient Care Team: [name], MD as PCP - General (Internal Medicine)  Extended Emergency Contact Information Primary Emergency Contact: [name] Address: [address]          [place], [place] [address] [place] Phone: [phone] Relation: Other Secondary Emergency Contact: [name], [place] [address] [name] of [place] Phone: [phone] Mobile Phone: [phone] Relation: Niece  Code Status:  Full Code Goals of care: Advanced Directive information Advanced Directives 05/16/2018  Does Patient Have a Medical Advance Directive? Yes  Type of Advance Directive (No Data)  Does patient want to make changes to medical advance directive? No - Patient declined  Copy of [place] in Chart? No - copy requested  Pre-existing out of facility DNR order (yellow form or pink MOST form) -     Chief Complaint  Patient presents with  . Medical Management of Chronic Issues    Routine visit of medical management  Medical management of chronic medical conditions including dementia- chronic renal insufficiency with hyperkalemia-anemia of chronic disease-hypertension- osteoporosis  HPI:  Pt is a 84 y.o. female seen today for medical management of chronic diseases.  As noted above.  She continues to be quite stable nursing does not report any recent acute issues.  She does have a history of dementia occasionally gets a bit agitated later in the day but is easily redirected- she is on Aricept her weight appears to be pretty stable at around 105 pounds appears her appetite is fairly decent as well.  She does have a history of chronic renal insufficiency with hyperkalemia and she is on Kayexalate 3 times a week this appears to be stable back in August labs showed a potassium of  4.3 creatinine was 1.16.  She has associated anemia of chronic disease as well and hemoglobin has shown relative stability at 9.7 on August lab.  Regards to hypertension she is on labetalol and Norvasc this does not appear to be consistently elevated I did get a blood pressure of 142/58 this afternoon previous reading was 124/64 it does not appear she has consistent elevations at this point will monitor. e also has a history of osteoporosis on Prolia and appears to be tolerating this well.   Currently she is lying in bed comfortably and does not have any complaints nursing again does not report any issues either    Past Medical History:  Diagnosis Date  . Anemia   . Anxiety   . Dementia ([place])   . Dysphagia, oropharyngeal phase   . GERD (gastroesophageal reflux disease)   . Hyperpotassemia   . Hyperpotassemia   . Hypertension   . Other severe protein-calorie malnutrition    Past Surgical History:  Procedure Laterality Date  . ABDOMINAL HYSTERECTOMY    . APPENDECTOMY      No Known Allergies  Outpatient Encounter Medications as of 06/01/2018  Medication Sig  . amLODipine (NORVASC) 5 MG tablet Take 5 mg by mouth daily. For HTN  . denosumab (PROLIA) 60 MG/ML SOSY injection Inject 60 mg into the skin every 6 (six) months.  . donepezil (ARICEPT) 5 MG tablet Take 5 mg by mouth daily.   . feeding supplement, GLUCERNA SHAKE, (GLUCERNA SHAKE) LIQD Take 237 mLs by mouth 2 (two) times daily between  meals.  . Ferrous Sulfate (IRON) 325 (65 Fe) MG TABS Take 1 tablet by mouth once a day  . labetalol (NORMODYNE) 200 MG tablet Take 200 mg by mouth 2 (two) times daily. For HTN  . ranitidine (ZANTAC) 150 MG tablet Take 150 mg by mouth daily.  . sodium polystyrene (KAYEXALATE) 15 GM/60ML suspension Take 15 g by mouth once. Once a day on Sun,Tue,Thu   No facility-administered encounter medications on file as of 06/01/2018.      Review of Systems   This is largely unobtainable secondary to  dementia but she is not complaining of any pain or shortness of breath--nursing does not report any issues-- her weight appears to be stable --- appears to be pretty content  Immunization History  Administered Date(s) Administered  . Influenza-Unspecified 05/23/2014, 05/18/2016, 05/20/2017  . Pneumococcal Conjugate-13 11/07/2015  . Pneumococcal-Unspecified 05/25/2016  . Tdap 04/28/2017   Pertinent  Health Maintenance Due  Topic Date Due  . INFLUENZA VACCINE  03/16/2018  . DEXA SCAN  Completed  . PNA vac Low Risk Adult  Completed   Fall Risk  05/16/2018 04/25/2017  Falls in the past year? No No   Functional Status Survey:    Vitals:   06/01/18 1436  BP: 130/80  Pulse: 84  Resp: 20  Temp: 98 F (36.7 C)  TempSrc: Oral  SpO2: 98%  Weight: 105 lb 9.6 oz (47.9 kg)  Height: 5' 5.5" (1.664 m)  Manual blood pressure was 142/58 Body mass index is 17.31 kg/m. Physical Exam   In general this is a very pleasant elderly female who appears fairly well-developed for her age- she is lying in bed comfortably.  Her skin is warm and dry.  Eyes she has prescription lenses sclera and conjunctive are clear visual acuity appears to be intact.  Oropharynx is clear his membranes fairly moist she has numerous extractions.  Chest is clear to auscultation there is no labored breathing.  Heart is regular rate and rhythm with an occasional irregular beat she does not have significant lower extremity edema pedal pulses are intact bilaterally.  Abdomen is soft nontender with positive bowel sounds.  Musculoskeletal moves all extremities x4 with baseline strength and range of motion- She continues to ambulate very well by herself in the wheelchair  Neurologic is grossly intact without lateralizing findings her speech is clear.  Psych she is oriented to self largely pleasant and cooperative again she does have some agitation at times at night stating she needs someone to drive her home--but she is  usually easily redirected  Labs reviewed: Recent Labs    11/11/17 0700 01/31/18 0715 04/05/18 0700  NA 137 139 137  K 3.8 4.6 4.3  CL 104 108 106  CO2 22 25 24   GLUCOSE 93 84 86  BUN 27* 33* 26*  CREATININE 1.13* 1.16* 1.16*  CALCIUM 9.2 9.5 8.8*   Recent Labs    06/13/17 1300 11/11/17 0700  AST 22 19  ALT 12* 11*  ALKPHOS 86 67  BILITOT 0.5 0.7  PROT 7.6 6.2*  ALBUMIN 4.0 3.2*   Recent Labs    08/05/17 0728 11/11/17 0700 01/31/18 0715 04/05/18 0700  WBC 4.5 4.5 4.8 4.6  NEUTROABS 2.3 2.5  --  2.2  HGB 9.6* 9.3* 9.8* 9.7*  HCT 29.5* 28.4* 30.3* 29.4*  MCV 99.0 99.3 100.0 99.0  PLT 223 216 220 205   Lab Results  Component Value Date   TSH 2.867 01/06/2017   No results found for: HGBA1C  No results found for: CHOL, HDL, LDLCALC, LDLDIRECT, TRIG, CHOLHDL  Significant Diagnostic Results in last 30 days:  No results found.  Assessment/Plan  #1 history of dementia this appears to be stable her weight is stable appetite appears to be fairly good she does very well with supportive care she does attend activities.  Occasional agitation at night but she is easily redirected at this point will monitor and continue supportive care  2.  History of chronic kidney disease with hyperkalemia-as noted above this is been stable on Kayexalate 3 times a week- will update a metabolic panel-.  3.-  Anemia of chronic disease-this appears stable with a hemoglobin most recently of 9.7 will update this as well.  4-- hypertension she continues on Norvasc and labetalol appears recent systolics have been in the 120-140 range- I do not see consistent elevations at this point will monitor would be hesitant to be very aggressive here with her advanced age and risk of hypotension  #5 history of osteoporosis with T score of -2.8 she is on Prolia and tolerating this well  Again she appears to be doing quite well which is her baseline- we will update a CBC and metabolic panel to keep an  eye on her hemoglobin as well as renal disease with hyperkalemia.  [id number]    Family/ staff Communication:   Labs/tests ordered:

## 2018-06-02 ENCOUNTER — Encounter (HOSPITAL_COMMUNITY)
Admission: RE | Admit: 2018-06-02 | Discharge: 2018-06-02 | Disposition: A | Discharge status: Home / Self Care | Payer: Medicare Other | Source: Skilled Nursing Facility | Attending: Internal Medicine | Admitting: Internal Medicine

## 2018-06-02 DIAGNOSIS — F0390 Unspecified dementia without behavioral disturbance: Secondary | ICD-10-CM | POA: Diagnosis present

## 2018-06-02 DIAGNOSIS — I129 Hypertensive chronic kidney disease with stage 1 through stage 4 chronic kidney disease, or unspecified chronic kidney disease: Secondary | ICD-10-CM | POA: Diagnosis present

## 2018-06-02 LAB — BASIC METABOLIC PANEL
Anion gap: 4 — ABNORMAL LOW (ref 5–15)
BUN: 24 mg/dL — ABNORMAL HIGH (ref 8–23)
CO2: 25 mmol/L (ref 22–32)
CREATININE: 1 mg/dL (ref 0.44–1.00)
Calcium: 9 mg/dL (ref 8.9–10.3)
Chloride: 111 mmol/L (ref 98–111)
GFR calc Af Amer: 54 mL/min — ABNORMAL LOW (ref >60)
GFR, EST NON AFRICAN AMERICAN: 46 mL/min — AB (ref >60)
GLUCOSE: 85 mg/dL (ref 70–99)
Potassium: 3.8 mmol/L (ref 3.5–5.1)
Sodium: 140 mmol/L (ref 135–145)

## 2018-06-02 LAB — CBC WITH DIFFERENTIAL/PLATELET
Abs Immature Granulocytes: 0.01 10*3/uL (ref 0.00–0.07)
BASOS ABS: 0 10*3/uL (ref 0.0–0.1)
BASOS PCT: 1 %
EOS ABS: 0.3 10*3/uL (ref 0.0–0.5)
EOS PCT: 7 %
HEMATOCRIT: 29 % — AB (ref 36.0–46.0)
Hemoglobin: 9.2 g/dL — ABNORMAL LOW (ref 12.0–15.0)
Immature Granulocytes: 0 %
LYMPHS ABS: 1.5 10*3/uL (ref 0.7–4.0)
LYMPHS PCT: 32 %
MCH: 32.4 pg (ref 26.0–34.0)
MCHC: 31.7 g/dL (ref 30.0–36.0)
MCV: 102.1 fL — ABNORMAL HIGH (ref 80.0–100.0)
Monocytes Absolute: 0.6 10*3/uL (ref 0.1–1.0)
Monocytes Relative: 12 %
NEUTROS PCT: 48 %
Neutro Abs: 2.3 10*3/uL (ref 1.7–7.7)
PLATELETS: 189 10*3/uL (ref 150–400)
RBC: 2.84 MIL/uL — ABNORMAL LOW (ref 3.87–5.11)
RDW: 13.1 % (ref 11.5–15.5)
WBC: 4.8 10*3/uL (ref 4.0–10.5)
nRBC: 0 % (ref 0.0–0.2)

## 2018-06-30 ENCOUNTER — Encounter: Payer: Self-pay | Admitting: Internal Medicine

## 2018-06-30 ENCOUNTER — Non-Acute Institutional Stay (SKILLED_NURSING_FACILITY): Payer: Medicare Other | Admitting: Internal Medicine

## 2018-06-30 DIAGNOSIS — I10 Essential (primary) hypertension: Secondary | ICD-10-CM | POA: Diagnosis not present

## 2018-06-30 DIAGNOSIS — D631 Anemia in chronic kidney disease: Secondary | ICD-10-CM

## 2018-06-30 DIAGNOSIS — N183 Chronic kidney disease, stage 3 unspecified: Secondary | ICD-10-CM

## 2018-06-30 DIAGNOSIS — F0390 Unspecified dementia without behavioral disturbance: Secondary | ICD-10-CM

## 2018-06-30 DIAGNOSIS — E875 Hyperkalemia: Secondary | ICD-10-CM | POA: Diagnosis not present

## 2018-06-30 NOTE — Progress Notes (Signed)
Location:    [facility] Room Number: 109/W Place of Service:  SNF [phone]) Provider: Granville Lewis PA-C  [name], MD  Patient Care Team: [name], MD as PCP - General (Internal Medicine)  Extended Emergency Contact Information Primary Emergency Contact: [name] Address: [address]          [place], [place] [address] [place] Phone: [phone] Relation: Other Secondary Emergency Contact: [name], [place] [address] [name] of [place] Phone: [phone] Mobile Phone: [phone] Relation: Niece  Code Status:  Full Code Goals of care: Advanced Directive information Advanced Directives 06/30/2018  Does Patient Have a Medical Advance Directive? Yes  Type of Advance Directive (No Data)  Does patient want to make changes to medical advance directive? No - Patient declined  Copy of [place] in Chart? No - copy requested  Pre-existing out of facility DNR order (yellow form or pink MOST form) -     Chief Complaint  Patient presents with  . Medical Management of Chronic Issues    Routine visit of medical management   Of chronic diseases including dementia-chronic renal insufficiency with hyperkalemia as well as hypertension osteoporosis and anemia of chronic disease.    HPI:  Pt is a 84 y.o. female seen today for medical management of chronic diseases. She continues to have an extended period of stability her weight appears to be stable at around 105 pounds most recently 104.2.  Continues on low-dose Aricept-does well with supportive care she is on supplements apparently does not eat breakfast well but does well with her other meals.  She has a history of chronic renal insufficiency with hyperkalemia continues on Kayexalate 3 times a week--potassium last month was 3.8 this is been stable now for some time creatinine was 1.0 BUN 24 which also shows stability.  Regards to  anemia with chronic disease this appears fairly stable with a hemoglobin 9.2 last month as well.  She does have a history of hypertension continues on labetalol 200 mg twice daily and Norvasc 5 mg a day this is stable blood pressure today was over 80 I see previous readings in the 09/05[birth year]1928 area with occasional spikes a bit above this but this does not appear to be consistent.  She also has a history of osteoporosis is on Prolia and apparently has tolerated this well.  Respect to GERD she is on Zantac and she is relatively asymptomatic at this time.  Currently she is sitting in her wheelchair comfortably continues to be very pleasant somewhat confused but pleasantly so-she still at times gets a bit agitated at night but continues to be easily redirected   Past Medical History:  Diagnosis Date  . Anemia   . Anxiety   . Dementia ([place] [facility])   . Dysphagia, oropharyngeal phase   . GERD (gastroesophageal reflux disease)   . Hyperpotassemia   . Hyperpotassemia   . Hypertension   . Other severe protein-calorie malnutrition    Past Surgical History:  Procedure Laterality Date  . ABDOMINAL HYSTERECTOMY    . APPENDECTOMY      No Known Allergies  Outpatient Encounter Medications as of 06/30/2018  Medication Sig  . amLODipine (NORVASC) 5 MG tablet Take 5 mg by mouth daily. For HTN  . denosumab (PROLIA) 60 MG/ML SOSY injection Inject 60 mg into the skin every 6 (six) months.  . donepezil (ARICEPT) 5 MG tablet Take 5  mg by mouth daily.   . feeding supplement, GLUCERNA SHAKE, (GLUCERNA SHAKE) LIQD Take 237 mLs by mouth 2 (two) times daily between meals.  . Ferrous Sulfate (IRON) 325 (65 Fe) MG TABS Take 1 tablet by mouth once a day  . labetalol (NORMODYNE) 200 MG tablet Take 200 mg by mouth 2 (two) times daily. For HTN  . ranitidine (ZANTAC) 150 MG tablet Take 150 mg by mouth daily.  . sodium polystyrene (KAYEXALATE) 15 GM/60ML suspension Take 15 g by mouth once. Once a day on Sun,Tue,Thu   No  facility-administered encounter medications on file as of 06/30/2018.      Review of Systems   This is limited secondary to dementia provided by nursing as well.  General no complaints of fever chills her weight is stable.  Skin not complain of rashes or itching.  Head ears eyes nose mouth and throat she has prescription lenses not complaining of any visual changes or sore throat.  Respiratory does not complain of any shortness of breath or cough.  Cardiac is not complaining of chest pain or palpitations does not have significant lower extremity edema.  GI does not complain of abdominal pain nausea vomiting diarrhea constipation.  GU denies dysuria.  Musculoskeletal is not complaining of any joint pain today.  Continues to ambulate well in her wheelchair.  Neurologic does not complain of dizziness headache syncope or numbness.  And psych again does have dementia I would classify this is moderate continues to do well with supportive care with occasional agitation at night  Immunization History  Administered Date(s) Administered  . Influenza-Unspecified 05/23/2014, 05/18/2016, 05/20/2017  . Pneumococcal Conjugate-13 11/07/2015  . Pneumococcal-Unspecified 05/25/2016  . Tdap 04/28/2017   Pertinent  Health Maintenance Due  Topic Date Due  . INFLUENZA VACCINE  Completed  . DEXA SCAN  Completed  . PNA vac Low Risk Adult  Completed   Fall Risk  05/16/2018 04/25/2017  Falls in the past year? No No   Functional Status Survey:    Vitals:   06/30/18 1007  BP: 126/80  Pulse: 80  Resp: 18  Temp: (!) 96.5 F (35.8 C)  TempSrc: Oral  SpO2: 98%  Weight: 104 lb 3.2 oz (47.3 kg)  Height: 5' 5.5" (1.664 m)   Body mass index is 17.08 kg/m. Physical Exam   In general this is a very pleasant elderly female in no distress continues to be in good spirits talkative.  Her skin is warm and dry.  Eyes visual acuity appears to be intact sclera and conjunctive are clear she  continues to have prescription lenses.  Oropharynx is clear mucous membranes moist she has numerous extractions.  Chest is clear to auscultation there is no labored breathing.  Heart is regular rate and rhythm without murmur gallop or rub she does not have significant lower extremity edema.  Her abdomen is soft nontender with positive bowel sounds.  Musculoskeletal moves all extremities x4 at baseline she does very well ambulating in the wheelchair facility.  Neurologic is grossly intact her speech is clear no lateralizing findings.  Psych she is bright and alert pleasant and appropriate oriented to self-continues to be talkative and pleasant  Labs reviewed: Recent Labs    01/31/18 0715 04/05/18 0700 06/02/18 0700  NA 139 137 140  K 4.6 4.3 3.8  CL 108 106 111  CO2 25 24 25   GLUCOSE 84 86 85  BUN 33* 26* 24*  CREATININE 1.16* 1.16* 1.00  CALCIUM 9.5 8.8* 9.0  Recent Labs    11/11/17 0700  AST 19  ALT 11*  ALKPHOS 67  BILITOT 0.7  PROT 6.2*  ALBUMIN 3.2*   Recent Labs    11/11/17 0700 01/31/18 0715 04/05/18 0700 06/02/18 0700  WBC 4.5 4.8 4.6 4.8  NEUTROABS 2.5  --  2.2 2.3  HGB 9.3* 9.8* 9.7* 9.2*  HCT 28.4* 30.3* 29.4* 29.0*  MCV 99.3 100.0 99.0 102.1*  PLT 216 220 205 189   Lab Results  Component Value Date   TSH 2.867 01/06/2017   No results found for: HGBA1C No results found for: CHOL, HDL, LDLCALC, LDLDIRECT, TRIG, CHOLHDL  Significant Diagnostic Results in last 30 days:  No results found.  Assessment/Plan  #1 history of dementia this appears stable on low-dose Aricept her weight is stable at the appetite appears to be good especially later in the day she is on supplements she attends activities and continues to ambulate about facility usually in a very pleasant manner.  Occasional nighttime agitation which appears to be transitory and easily redirected.  2.  History of anemia of chronic disease this appears stable hemoglobin 9.2 on lab done  last month will monitor this periodically.  She is on iron.  3.  History of chronic kidney disease with hyperkalemia this is stable on the Kayexalate 3 times a week will monitor periodically but this is been stable now for an extended period of time.  4.  History of hypertension continue on Norvasc and labetalol and appears to be doing well with baseline systolics continue to be in the 120-140 range with occasional spikes above this that are not persistent.  5.  History of osteoporosis I note recent T score was -2.8 she is been started on Prolia and this has been tolerated well-I note she also is on Zantac for GI issues possible GERD which have been asymptomatic now for a while.  CPT- [phone]

## 2018-08-01 ENCOUNTER — Non-Acute Institutional Stay (SKILLED_NURSING_FACILITY): Payer: Medicare Other | Admitting: Internal Medicine

## 2018-08-01 ENCOUNTER — Encounter: Payer: Self-pay | Admitting: Internal Medicine

## 2018-08-01 DIAGNOSIS — M810 Age-related osteoporosis without current pathological fracture: Secondary | ICD-10-CM

## 2018-08-01 DIAGNOSIS — I10 Essential (primary) hypertension: Secondary | ICD-10-CM | POA: Diagnosis not present

## 2018-08-01 DIAGNOSIS — F0390 Unspecified dementia without behavioral disturbance: Secondary | ICD-10-CM | POA: Diagnosis not present

## 2018-08-01 DIAGNOSIS — N183 Chronic kidney disease, stage 3 unspecified: Secondary | ICD-10-CM

## 2018-08-01 DIAGNOSIS — D631 Anemia in chronic kidney disease: Secondary | ICD-10-CM

## 2018-08-01 DIAGNOSIS — E875 Hyperkalemia: Secondary | ICD-10-CM

## 2018-08-01 NOTE — Progress Notes (Signed)
Location:    [facility] Room Number: 109/W Place of Service:  SNF [phone]) Provider: Veleta Miners MD  [name], MD  Patient Care Team: [name], MD as PCP - General (Internal Medicine)  Extended Emergency Contact Information Primary Emergency Contact: [name] Address: [address]          [place], [place] [address] [place] Phone: [phone] Relation: Other Secondary Emergency Contact: [name], [place] [address] [name] of [place] Phone: [phone] Mobile Phone: [phone] Relation: Niece  Code Status:  Full Code Goals of care: Advanced Directive information Advanced Directives 08/01/2018  Does Patient Have a Medical Advance Directive? Yes  Type of Advance Directive (No Data)  Does patient want to make changes to medical advance directive? No - Patient declined  Copy of [place] in Chart? No - copy requested  Pre-existing out of facility DNR order (yellow form or pink MOST form) -     Chief Complaint  Patient presents with  . Medical Management of Chronic Issues    Routine visit of medical management    HPI:  Pt is a 84 y.o. female seen today for medical management of chronic diseases.   Patient has h/o Hyperkalemia secondary to Chronic renal insufficiency, hypertension, anemia secondary to renal diseaseAnd dementia  Patient has been stable in the facility. Weight stable at 104 lbs. No New Nursing issues. She usually gets around in the facility on Wheelchair.  Past Medical History:  Diagnosis Date  . Anemia   . Anxiety   . Dementia ([facility])   . Dysphagia, oropharyngeal phase   . GERD (gastroesophageal reflux disease)   . Hyperpotassemia   . Hyperpotassemia   . Hypertension   . Other severe protein-calorie malnutrition    Past Surgical History:  Procedure Laterality Date  . ABDOMINAL HYSTERECTOMY    . APPENDECTOMY      No Known  Allergies  Outpatient Encounter Medications as of 08/01/2018  Medication Sig  . amLODipine (NORVASC) 5 MG tablet Take 5 mg by mouth daily. For HTN  . denosumab (PROLIA) 60 MG/ML SOSY injection Inject 60 mg into the skin every 6 (six) months.  . donepezil (ARICEPT) 5 MG tablet Take 5 mg by mouth daily.   . feeding supplement, GLUCERNA SHAKE, (GLUCERNA SHAKE) LIQD Take 237 mLs by mouth 2 (two) times daily between meals.  . Ferrous Sulfate (IRON) 325 (65 Fe) MG TABS Take 1 tablet by mouth once a day  . labetalol (NORMODYNE) 200 MG tablet Take 200 mg by mouth 2 (two) times daily. For HTN  . ranitidine (ZANTAC) 150 MG tablet Take 150 mg by mouth daily.  . sodium polystyrene (KAYEXALATE) 15 GM/60ML suspension Take 15 g by mouth once. Once a day on Sun,Tue,Thu   No facility-administered encounter medications on file as of 08/01/2018.      Review of Systems  Unable to perform ROS: Dementia    Immunization History  Administered Date(s) Administered  . Influenza-Unspecified 05/23/2014, 05/18/2016, 05/20/2017  . Pneumococcal Conjugate-13 11/07/2015  . Pneumococcal-Unspecified 05/25/2016  . Tdap 04/28/2017   Pertinent  Health Maintenance Due  Topic Date Due  . INFLUENZA VACCINE  Completed  . DEXA SCAN  Completed  . PNA vac Low Risk Adult  Completed   Fall Risk  05/16/2018 04/25/2017  Falls in the past year? No No   Functional Status Survey:    There were no  vitals filed for this visit. There is no height or weight on file to calculate BMI. Physical Exam Constitutional:      Appearance: She is well-developed.  HENT:     Head: Normocephalic.  Eyes:     Pupils: Pupils are equal, round, and reactive to light.  Neck:     Musculoskeletal: Neck supple.  Cardiovascular:     Rate and Rhythm: Normal rate and regular rhythm.  Pulmonary:     Effort: Pulmonary effort is normal.     Breath sounds: Normal breath sounds.  Abdominal:     General: Bowel sounds are normal.     Palpations:  Abdomen is soft.  Skin:    General: Skin is warm and dry.  Neurological:     Mental Status: She is alert.     Comments: Not Oriented Responds Appropriately . Follows most Commands Walks with Environmental consultant and Max assist otherwise stays in the wheelchair  Psychiatric:        Behavior: Behavior normal.     Labs reviewed: Recent Labs    01/31/18 0715 04/05/18 0700 06/02/18 0700  NA 139 137 140  K 4.6 4.3 3.8  CL 108 106 111  CO2 25 24 25   GLUCOSE 84 86 85  BUN 33* 26* 24*  CREATININE 1.16* 1.16* 1.00  CALCIUM 9.5 8.8* 9.0   Recent Labs    11/11/17 0700  AST 19  ALT 11*  ALKPHOS 67  BILITOT 0.7  PROT 6.2*  ALBUMIN 3.2*   Recent Labs    11/11/17 0700 01/31/18 0715 04/05/18 0700 06/02/18 0700  WBC 4.5 4.8 4.6 4.8  NEUTROABS 2.5  --  2.2 2.3  HGB 9.3* 9.8* 9.7* 9.2*  HCT 28.4* 30.3* 29.4* 29.0*  MCV 99.3 100.0 99.0 102.1*  PLT 216 220 205 189   Lab Results  Component Value Date   TSH 2.867 01/06/2017   No results found for: HGBA1C No results found for: CHOL, HDL, LDLCALC, LDLDIRECT, TRIG, CHOLHDL  Significant Diagnostic Results in last 30 days:  No results found.  Assessment/Plan Essential hypertension BP Stable on Norvasc and Labetolol  Age-related osteoporosis  On Prolia  Tscore was - 2.8  Stage 3 chronic kidney disease  Creat Stable  Hyperkalemia On Kayexalate  Anemia due to stage 3 chronic kidney disease  Hgb Stable On iron   Dementia  On Aricept ACP D/W social Worker for talking to POA to review her Code status. Her niece is her POA    Pharmacist, hospital Communication:   Labs/tests ordered:    Total time spent in this patient care encounter was 25_ minutes; greater than 50% of the visit spent counseling patient, reviewing records , Labs and coordinating care for problems addressed at this encounter.

## 2018-08-25 ENCOUNTER — Non-Acute Institutional Stay (SKILLED_NURSING_FACILITY): Payer: Medicare Other | Admitting: Internal Medicine

## 2018-08-25 ENCOUNTER — Encounter: Payer: Self-pay | Admitting: Internal Medicine

## 2018-08-25 DIAGNOSIS — D631 Anemia in chronic kidney disease: Secondary | ICD-10-CM

## 2018-08-25 DIAGNOSIS — F0390 Unspecified dementia without behavioral disturbance: Secondary | ICD-10-CM | POA: Diagnosis not present

## 2018-08-25 DIAGNOSIS — N183 Chronic kidney disease, stage 3 unspecified: Secondary | ICD-10-CM

## 2018-08-25 DIAGNOSIS — I10 Essential (primary) hypertension: Secondary | ICD-10-CM | POA: Diagnosis not present

## 2018-08-25 DIAGNOSIS — M810 Age-related osteoporosis without current pathological fracture: Secondary | ICD-10-CM | POA: Diagnosis not present

## 2018-08-25 NOTE — Progress Notes (Signed)
Location:    [facility] Room Number: 109/W Place of Service:  SNF [phone]) Provider: Granville Lewis PA-C  [name], MD  Patient Care Team: [name], MD as PCP - General (Internal Medicine)  Extended Emergency Contact Information Primary Emergency Contact: High,[name] Address: [address]          [place], [place] [address] [place] Phone: [phone] Relation: Other Secondary Emergency Contact: [name], [place] [address] [name] of [place] Phone: [phone] Mobile Phone: [phone] Relation: Niece  Code Status:  Full Code Goals of care: Advanced Directive information Advanced Directives 08/25/2018  Does Patient Have a Medical Advance Directive? Yes  Type of Advance Directive (No Data)  Does patient want to make changes to medical advance directive? No - Patient declined  Copy of [place] in Chart? No - copy requested  Pre-existing out of facility DNR order (yellow form or pink MOST form) -     Chief Complaint  Patient presents with  . Medical Management of Chronic Issues    Routine visit of medical management   Medical management of chronic medical conditions including dementia- chronic kidney disease with hyperkalemia-hypertension anemia secondary to renal disease.    HPI:  Pt is a 85 y.o. Morgan [name] seen today for medical management of chronic diseases.  As noted above.  She continues to have a period of extended stability.  Her dementia appears to be stabilized she does quite well with supportive care her weight appears to be fairly stable.  She is on low-dose Aricept  She does have a history of chronic kidney disease which appears to be stable she is on Kayexalate 3 times a week secondary to hyperkalemia which is been stable last potassium was 3.8 in October we will update this.  .  She also continues on iron for anemia which also has an element of chronic  disease this is been relatively stable with a hemoglobin of 9.2 in October and this will be updated as well.  In regards to hypertension this appears stable as well blood pressure today has a systolic in the 099I she is on Norvasc 5 mg a day as well as labetalol 200 twice daily.    She also continues on Prolia with a history of osteoporosis and has tolerated this well her T score was -2.8   Past Medical History:  Diagnosis Date  . Anemia   . Anxiety   . Dementia ([place])   . Dysphagia, oropharyngeal phase   . GERD (gastroesophageal reflux disease)   . Hyperpotassemia   . Hyperpotassemia   . Hypertension   . Other severe protein-calorie malnutrition    Past Surgical History:  Procedure Laterality Date  . ABDOMINAL HYSTERECTOMY    . APPENDECTOMY      No Known Allergies  Outpatient Encounter Medications as of 08/25/2018  Medication Sig  . amLODipine (NORVASC) 5 MG tablet Take 5 mg by mouth daily. For HTN  . denosumab (PROLIA) 60 MG/ML SOSY injection Inject 60 mg into the skin every 6 (six) months.  . donepezil (ARICEPT) 5 MG tablet Take 5 mg by mouth daily.   . Ferrous Sulfate (IRON) 325 (65 Fe) MG TABS Take 1 tablet by mouth once a day  . labetalol (NORMODYNE) 200 MG tablet Take 200 mg by mouth 2 (two) times daily. For HTN  . ranitidine (ZANTAC) 150 MG tablet Take 150 mg by  mouth daily.  . sodium polystyrene (KAYEXALATE) 15 GM/60ML suspension Take 15 g by mouth once. Once a day on Sun,Tue,Thu  . [DISCONTINUED] feeding supplement, GLUCERNA SHAKE, (GLUCERNA SHAKE) LIQD Take 237 mLs by mouth 2 (two) times daily between meals.   No facility-administered encounter medications on file as of 08/25/2018.      Review of Systems   This is limited secondary to dementia.  General no complaints of fever chills weight appears to be relatively stable.  Skin does not complain of rashes or itching.  Head ears eyes nose mouth and throat has prescription lenses does not complain of visual  changes or sore throat or difficulty swallowing.  Respiratory is not complaining of shortness of breath or cough.  Cardiac does not complain of chest pain or increased edema.  GI does not complain of abdominal pain nausea vomiting diarrhea or constipation.  GU does not complain of dysuria.  Musculoskeletal does not complain of joint pain continues to ambulate about facility in her wheelchair.  Neurologic does not complain of feeling dizzy syncopal or having headaches   Psych does not complain of being overtly depressed or anxious at times will have  evening agitation but usually is easily redirected  Immunization History  Administered Date(s) Administered  . Influenza-Unspecified 05/23/2014, 05/18/2016, 05/20/2017  . Pneumococcal Conjugate-13 11/07/2015  . Pneumococcal-Unspecified 05/25/2016  . Tdap 04/28/2017   Pertinent  Health Maintenance Due  Topic Date Due  . INFLUENZA VACCINE  Completed  . DEXA SCAN  Completed  . PNA vac Low Risk Adult  Completed   Fall Risk  05/16/2018 04/25/2017  Falls in the past year? No No   Functional Status Survey:    Vitals:   08/25/18 0930  BP: 125/67  Pulse: 73  Resp: 18  Temp: (!) 96.5 F (35.8 C)  TempSrc: Oral  SpO2: 98%  Weight: 102 lb 6.4 oz (46.4 kg)  Height: 5' 5.5" (1.664 m)   Body mass index is 16.78 kg/m. Physical Exam   In general this is a pleasant elderly Morgan [name] in no distress sitting comfortably in her wheelchair.  Her skin is warm and dry.  Eyes visual acuity appears to be intact sclera and conjunctive are clear she has prescription lenses.  Oropharynx is clear mucous membranes moist she has numerous extractions.  Chest is clear to auscultation there is no labored breathing.  Heart is regular rate and rhythm without murmur gallop or rub or significant lower extremity edema.  Her abdomen is soft nontender with positive bowel sounds.  Musculoskeletal continues to move all extremities x4 at baseline continues  to do very well ambulating in her wheelchair about the facility.  Neurologic is grossly intact her speech is clear no lateralizing findings.  Psych continues to be bright alert pleasant smiling-she is oriented to self continues to be talkative pleasant in good spirits   Labs reviewed: Recent Labs    01/31/18 0715 04/05/18 0700 06/02/18 0700  NA 139 137 140  K 4.6 4.3 3.8  CL 108 106 111  CO2 25 24 25   GLUCOSE 84 86 85  BUN 33* 26* 24*  CREATININE 1.16* 1.16* 1.00  CALCIUM 9.5 8.8* 9.0   Recent Labs    11/11/17 0700  AST 19  ALT 11*  ALKPHOS 67  BILITOT 0.7  PROT 6.2*  ALBUMIN 3.2*   Recent Labs    11/11/17 0700 01/31/18 0715 04/05/18 0700 06/02/18 0700  WBC 4.5 4.8 4.6 4.8  NEUTROABS 2.5  --  2.2 2.3  HGB 9.3* 9.8* 9.7* 9.2*  HCT 28.4* 30.3* 29.4* 29.0*  MCV 99.3 100.0 99.0 102.1*  PLT 216 220 205 189   Lab Results  Component Value Date   TSH 2.867 01/06/2017   No results found for: HGBA1C No results found for: CHOL, HDL, LDLCALC, LDLDIRECT, TRIG, CHOLHDL  Significant Diagnostic Results in last 30 days:  No results found.  Assessment/Plan  #1 dementia this appears to be stable she is on low-dose Aricept and does well with supportive care her weight is relatively stable has occasional behaviors at night but continues to be easily redirected.  2.-  History of chronic kidney disease with hyperkalemia this is stabilized with Kayexalate 3 times a week at this point will update labs last creatinine was 1.0 with a potassium of 3.8.  3.  History of anemia with element of chronic kidney disease-she is on iron-last hemoglobin of 9.2 appears relatively baseline will have this updated.  4.  History of hypertension continues on Norvasc 5 mg a day and labetalol 200 mg twice daily-as stated above this appears to be stable as well.  5.  History of osteoporosis with T score of -2.8 she is on Prolia and this appears to be tolerated   CPT- [phone]

## 2018-08-28 ENCOUNTER — Encounter (HOSPITAL_COMMUNITY)
Admission: RE | Admit: 2018-08-28 | Discharge: 2018-08-28 | Disposition: A | Discharge status: Home / Self Care | Payer: Medicare Other | Source: Skilled Nursing Facility | Attending: Internal Medicine | Admitting: Internal Medicine

## 2018-08-28 DIAGNOSIS — F0390 Unspecified dementia without behavioral disturbance: Secondary | ICD-10-CM | POA: Diagnosis present

## 2018-08-28 DIAGNOSIS — I129 Hypertensive chronic kidney disease with stage 1 through stage 4 chronic kidney disease, or unspecified chronic kidney disease: Secondary | ICD-10-CM | POA: Diagnosis present

## 2018-08-28 LAB — CBC WITH DIFFERENTIAL/PLATELET
Abs Immature Granulocytes: 0.01 10*3/uL (ref 0.00–0.07)
BASOS ABS: 0 10*3/uL (ref 0.0–0.1)
Basophils Relative: 0 %
EOS PCT: 4 %
Eosinophils Absolute: 0.2 10*3/uL (ref 0.0–0.5)
HCT: 28.9 % — ABNORMAL LOW (ref 36.0–46.0)
Hemoglobin: 9.1 g/dL — ABNORMAL LOW (ref 12.0–15.0)
Immature Granulocytes: 0 %
LYMPHS ABS: 1.4 10*3/uL (ref 0.7–4.0)
Lymphocytes Relative: 29 %
MCH: 32.6 pg (ref 26.0–34.0)
MCHC: 31.5 g/dL (ref 30.0–36.0)
MCV: 103.6 fL — AB (ref 80.0–100.0)
MONO ABS: 0.6 10*3/uL (ref 0.1–1.0)
Monocytes Relative: 12 %
Neutro Abs: 2.6 10*3/uL (ref 1.7–7.7)
Neutrophils Relative %: 55 %
Platelets: 186 10*3/uL (ref 150–400)
RBC: 2.79 MIL/uL — ABNORMAL LOW (ref 3.87–5.11)
RDW: 13.3 % (ref 11.5–15.5)
WBC: 4.8 10*3/uL (ref 4.0–10.5)
nRBC: 0 % (ref 0.0–0.2)

## 2018-08-28 LAB — BASIC METABOLIC PANEL
Anion gap: 8 (ref 5–15)
BUN: 51 mg/dL — AB (ref 8–23)
CALCIUM: 9.5 mg/dL (ref 8.9–10.3)
CHLORIDE: 104 mmol/L (ref 98–111)
CO2: 26 mmol/L (ref 22–32)
Creatinine, Ser: 1.13 mg/dL — ABNORMAL HIGH (ref 0.44–1.00)
GFR calc Af Amer: 48 mL/min — ABNORMAL LOW (ref >60)
GFR calc non Af Amer: 41 mL/min — ABNORMAL LOW (ref >60)
GLUCOSE: 82 mg/dL (ref 70–99)
Potassium: 4.4 mmol/L (ref 3.5–5.1)
Sodium: 138 mmol/L (ref 135–145)

## 2018-09-11 ENCOUNTER — Encounter: Payer: Self-pay | Admitting: Internal Medicine

## 2018-09-11 ENCOUNTER — Non-Acute Institutional Stay (SKILLED_NURSING_FACILITY): Payer: Medicare Other | Admitting: Internal Medicine

## 2018-09-11 DIAGNOSIS — D631 Anemia in chronic kidney disease: Secondary | ICD-10-CM | POA: Diagnosis not present

## 2018-09-11 DIAGNOSIS — R5381 Other malaise: Secondary | ICD-10-CM

## 2018-09-11 DIAGNOSIS — N183 Chronic kidney disease, stage 3 unspecified: Secondary | ICD-10-CM

## 2018-09-11 DIAGNOSIS — F0390 Unspecified dementia without behavioral disturbance: Secondary | ICD-10-CM | POA: Diagnosis not present

## 2018-09-11 NOTE — Progress Notes (Signed)
This is an acute visit.  Level care skilled.  Facility is [facility].  Chief complaint-acute visit secondary to the patient "feeling sick"  History of present illness.  Patient is a pleasant 85 year old resident who is a long-term resident of facility  Who apparently stopped a staff member in the hall stating that she was "feeling sick".  Patient has a history of dementia and does well with supportive care she also has a history of chronic kidney disease and hyperkalemia which is been stable with Kayexalate- and also continues on iron for anemia with an element of chronic disease as well.  She also has a history of hypertension on Norvasc and labetalol she is also on Prolia with a history of osteoporosis.  Patient is a poor historian and when asked what is making her feel sick does not really have a specific complaints saying she just feels sick.  She is not really complaining of abdominal pain or chills or shortness of breath or chest pain.  Or burning with urination  Vital signs are stable she is afebrile. She apparently did not eat that well at supper but when I encouraged her to drink her Ensure she appeared to oblige.   Past Medical History:  Diagnosis Date  . Anemia   . Anxiety   . Dementia ([place])   . Dysphagia, oropharyngeal phase   . GERD (gastroesophageal reflux disease)   . Hyperpotassemia   . Hyperpotassemia   . Hypertension   . Other severe protein-calorie malnutrition         Past Surgical History:  Procedure Laterality Date  . ABDOMINAL HYSTERECTOMY    . APPENDECTOMY      No Known Allergies    MEDICATIONS     Medication Sig  . amLODipine (NORVASC) 5 MG tablet Take 5 mg by mouth daily. For HTN  . denosumab (PROLIA) 60 MG/ML SOSY injection Inject 60 mg into the skin every 6 (six) months.  . donepezil (ARICEPT) 5 MG tablet Take 5 mg by mouth daily.   . Ferrous Sulfate (IRON) 325 (65 Fe) MG TABS Take 1 tablet by mouth once a day    . labetalol (NORMODYNE) 200 MG tablet Take 200 mg by mouth 2 (two) times daily. For HTN  . ranitidine (ZANTAC) 150 MG tablet Take 150 mg by mouth daily.  . sodium polystyrene (KAYEXALATE) 15 GM/60ML suspension Take 15 g by mouth once. Once a day on Sun,Tue,Thu  . [DISCONTINUED] feeding supplement, GLUCERNA SHAKE, (GLUCERNA SHAKE) LIQD Take 237 mLs by mouth 2 (two) times daily between meals.   No facility-administered encounter medications on file as of 08/25/2018.     Review of systems.  This is limited secondary to dementia please see HPI.  She is not really complaining of any specific pain no abdominal pain no nausea and vomiting been noted -- is not complaining of being short of breath or having chest pain does not complain of dysuria Review of Systems   This is limited secondary to dementia.   .  Temperature is 96.8 pulse 65 respirations 20 blood pressure 140/80 O2 saturation is 97% on room air.  Physical exam.  In general this is a pleasant elderly female in no distress sitting in her wheelchair she does not appear to be in any distress but a little bit anxious.  Her skin is warm and dry she is not diaphoretic.  Eyes visual acuity appears to be intact sclera and conjunctive are clear.  Oropharynx is clear mucous membranes  moist tongue is midline.  Chest is clear to auscultation there is no labored breathing.  Heart is regular rate and rhythm without murmur gallop or rub she does not have significant lower extremity edema.  Abdomen is soft does not appear to be tender there are positive bowel sounds.  Musculoskeletal moves all extremities x4 at baseline continues to ambulate in wheelchair.  Neurologic is grossly intact her speech is clear no lateralizing findings.  Psych she is oriented self is pleasant appropriate appears to be at her baseline  Labs.  August 28, 2018.  WBC 4.8 hemoglobin 9.1 platelets 186.  Sodium 138 potassium 4.4 BUN 51 creatinine  1.13.  Assessment and plan.  1.  Malaise?  Feeling sick-could not really put a handle on this-physical exam was quite baseline.  I did reassess her after she was put to bed and she was saying she was feeling significantly better-physical exam was essentially unchanged she was less anxious resting comfortably without any complaints.  At this point will monitor vital signs pulse ox every shift for 24 hours to keep an eye on her--this may have been more of a transitory moment of not feeling well with this appears to have resolved but again she will have to be monitored.  #2-history of renal insufficiency chronic kidney disease this appears stable with a creatinine of 1.13 BUN of 51 on lab done January 13.  3.  History of anemia she is on iron hemoglobin appears relatively stable at 9.1 on lab done earlier this month.  4.  Hyperkalemia this appears stable as well potassium was 4.4 on lab done January 13  [id number]-of note greater than 25 minutes spent assessing patient reassessing patient reviewing her chart and labs discussing her status with nursing-

## 2018-09-15 ENCOUNTER — Other Ambulatory Visit (HOSPITAL_COMMUNITY)
Admission: RE | Admit: 2018-09-15 | Discharge: 2018-09-15 | Disposition: A | Discharge status: Home / Self Care | Payer: Medicare Other | Source: Skilled Nursing Facility | Attending: Internal Medicine | Admitting: Internal Medicine

## 2018-09-15 ENCOUNTER — Encounter: Payer: Self-pay | Admitting: Internal Medicine

## 2018-09-15 ENCOUNTER — Non-Acute Institutional Stay (SKILLED_NURSING_FACILITY): Payer: Medicare Other | Admitting: Internal Medicine

## 2018-09-15 DIAGNOSIS — R5381 Other malaise: Secondary | ICD-10-CM

## 2018-09-15 DIAGNOSIS — N390 Urinary tract infection, site not specified: Secondary | ICD-10-CM | POA: Diagnosis not present

## 2018-09-15 DIAGNOSIS — I129 Hypertensive chronic kidney disease with stage 1 through stage 4 chronic kidney disease, or unspecified chronic kidney disease: Secondary | ICD-10-CM | POA: Insufficient documentation

## 2018-09-15 DIAGNOSIS — R7989 Other specified abnormal findings of blood chemistry: Secondary | ICD-10-CM | POA: Diagnosis not present

## 2018-09-15 DIAGNOSIS — R109 Unspecified abdominal pain: Secondary | ICD-10-CM | POA: Diagnosis not present

## 2018-09-15 DIAGNOSIS — R8290 Unspecified abnormal findings in urine: Secondary | ICD-10-CM | POA: Insufficient documentation

## 2018-09-15 DIAGNOSIS — N189 Chronic kidney disease, unspecified: Secondary | ICD-10-CM | POA: Insufficient documentation

## 2018-09-15 DIAGNOSIS — R6889 Other general symptoms and signs: Secondary | ICD-10-CM | POA: Insufficient documentation

## 2018-09-15 DIAGNOSIS — R946 Abnormal results of thyroid function studies: Secondary | ICD-10-CM | POA: Insufficient documentation

## 2018-09-15 LAB — COMPREHENSIVE METABOLIC PANEL
ALT: 16 U/L (ref 0–44)
ANION GAP: 6 (ref 5–15)
AST: 22 U/L (ref 15–41)
Albumin: 3.9 g/dL (ref 3.5–5.0)
Alkaline Phosphatase: 54 U/L (ref 38–126)
BILIRUBIN TOTAL: 0.8 mg/dL (ref 0.3–1.2)
BUN: 52 mg/dL — ABNORMAL HIGH (ref 8–23)
CHLORIDE: 108 mmol/L (ref 98–111)
CO2: 24 mmol/L (ref 22–32)
Calcium: 9.3 mg/dL (ref 8.9–10.3)
Creatinine, Ser: 1.4 mg/dL — ABNORMAL HIGH (ref 0.44–1.00)
GFR calc Af Amer: 37 mL/min — ABNORMAL LOW (ref >60)
GFR, EST NON AFRICAN AMERICAN: 32 mL/min — AB (ref >60)
Glucose, Bld: 83 mg/dL (ref 70–99)
POTASSIUM: 4.9 mmol/L (ref 3.5–5.1)
Sodium: 138 mmol/L (ref 135–145)
Total Protein: 7 g/dL (ref 6.5–8.1)

## 2018-09-15 LAB — CBC WITH DIFFERENTIAL/PLATELET
Abs Immature Granulocytes: 0.02 10*3/uL (ref 0.00–0.07)
BASOS PCT: 0 %
Basophils Absolute: 0 10*3/uL (ref 0.0–0.1)
EOS ABS: 0.1 10*3/uL (ref 0.0–0.5)
EOS PCT: 3 %
HEMATOCRIT: 33.5 % — AB (ref 36.0–46.0)
Hemoglobin: 10.5 g/dL — ABNORMAL LOW (ref 12.0–15.0)
IMMATURE GRANULOCYTES: 0 %
LYMPHS PCT: 31 %
Lymphs Abs: 1.4 10*3/uL (ref 0.7–4.0)
MCH: 32.9 pg (ref 26.0–34.0)
MCHC: 31.3 g/dL (ref 30.0–36.0)
MCV: 105 fL — ABNORMAL HIGH (ref 80.0–100.0)
Monocytes Absolute: 0.4 10*3/uL (ref 0.1–1.0)
Monocytes Relative: 10 %
NRBC: 0 % (ref 0.0–0.2)
Neutro Abs: 2.5 10*3/uL (ref 1.7–7.7)
Neutrophils Relative %: 56 %
PLATELETS: 219 10*3/uL (ref 150–400)
RBC: 3.19 MIL/uL — ABNORMAL LOW (ref 3.87–5.11)
RDW: 12.9 % (ref 11.5–15.5)
WBC: 4.5 10*3/uL (ref 4.0–10.5)

## 2018-09-15 LAB — URINALYSIS, COMPLETE (UACMP) WITH MICROSCOPIC
BILIRUBIN URINE: NEGATIVE
GLUCOSE, UA: NEGATIVE mg/dL
KETONES UR: NEGATIVE mg/dL
NITRITE: NEGATIVE
PH: 7 (ref 5.0–8.0)
PROTEIN: 30 mg/dL — AB
Specific Gravity, Urine: 1.018 (ref 1.005–1.030)

## 2018-09-15 LAB — TSH: TSH: 2.495 u[IU]/mL (ref 0.350–4.500)

## 2018-09-15 NOTE — Progress Notes (Signed)
Location:    [facility] Room Number: 109/W Place of Service:  SNF [phone]) Provider:  Freddi Starr, MD  Patient Care Team: [name], MD as PCP - General (Internal Medicine)  Extended Emergency Contact Information Primary Emergency Contact: High,[name] Address: [address]          [place], [place] [address] [place] Phone: [phone] Relation: Other Secondary Emergency Contact: [name], [place] [address] [name] of [place] Phone: [phone] Mobile Phone: [phone] Relation: Niece  Code Status:  Full Code Goals of care: Advanced Directive information Advanced Directives 09/15/2018  Does Patient Have a Medical Advance Directive? Yes  Type of Advance Directive (No Data)  Does patient want to make changes to medical advance directive? No - Patient declined  Copy of [place] in Chart? No - copy requested  Pre-existing out of facility DNR order (yellow form or pink MOST form) -     Chief Complaint  Patient presents with  . Acute Visit    Stomach Pain   "feeling sick"  HPI:  Pt is a 85 y.o. female seen today for an acute visit for complaints of vague stomach plain and not feeling well.  Patient is a long-term resident of facility who is been quite stable she does have a history of dementia as well as chronic kidney disease hypertension as well as anemia.  Apparently she complained to nursing staff this morning about not feeling well and vague complaints of abdominal discomfort.  Vital signs continue to be stable.  I did see her a few days ago for somewhat similar complaints she was up in her wheelchair but later when I reassessed her in bed she did not really have any complaints.  And she was monitored and did not really appear to have further complaints  Vital signs continud  to be stable.     Past Medical History:  Diagnosis Date  . Anemia   .  Anxiety   . Dementia ([place])   . Dysphagia, oropharyngeal phase   . GERD (gastroesophageal reflux disease)   . Hyperpotassemia   . Hyperpotassemia   . Hypertension   . Other severe protein-calorie malnutrition    Past Surgical History:  Procedure Laterality Date  . ABDOMINAL HYSTERECTOMY    . APPENDECTOMY      No Known Allergies  Outpatient Encounter Medications as of 09/15/2018  Medication Sig  . amLODipine (NORVASC) 5 MG tablet Take 5 mg by mouth daily. For HTN  . denosumab (PROLIA) 60 MG/ML SOSY injection Inject 60 mg into the skin every 6 (six) months.  . donepezil (ARICEPT) 5 MG tablet Take 5 mg by mouth daily.   . Ferrous Sulfate (IRON) 325 (65 Fe) MG TABS Take 1 tablet by mouth once a day  . labetalol (NORMODYNE) 200 MG tablet Take 200 mg by mouth 2 (two) times daily. For HTN  . ranitidine (ZANTAC) 150 MG tablet Take 150 mg by mouth daily.  . sodium polystyrene (KAYEXALATE) 15 GM/60ML suspension Take 15 g by mouth once. Once a day on Sun,Tue,Thu   No facility-administered encounter medications on file as of 09/15/2018.     Review of Systems   This is somewhat limited secondary to dementia but has vague complaints of feeling sick possibly more stomach upset.  Skin is not complain of rashes or itching.  Head ears eyes nose mouth and throat  no complaints of visual changes or sore throat.  Respiratory denies shortness of breath or cough.  Cardiac denies chest pain.  GI has somewhat vague complaints of stomach pain there is been no nausea or vomiting nursing staff has not noted diarrhea or constipation.  GU again vague complaints of stomach pain possibly this could be suprapubic discomfort.  Musculoskeletal does not complain of joint pain.  Neurologic is not complaining of dizziness headache syncope or numbness.  And psych appear to be overtly anxious but does have some history of dementia  Immunization History  Administered Date(s) Administered  .  Influenza-Unspecified 05/23/2014, 05/18/2016, 05/20/2017  . Pneumococcal Conjugate-13 11/07/2015  . Pneumococcal-Unspecified 05/25/2016  . Tdap 04/28/2017   Pertinent  Health Maintenance Due  Topic Date Due  . INFLUENZA VACCINE  Completed  . DEXA SCAN  Completed  . PNA vac Low Risk Adult  Completed   Fall Risk  05/16/2018 04/25/2017  Falls in the past year? No No   Functional Status Survey:    Vitals:   09/15/18 1559  BP: 109/63  Pulse: 82  Resp: 18  Temp: 98.8 F (37.1 C)  TempSrc: Oral  SpO2: 96%    Physical Exam   In general this is a pleasant elderly female in no distress she is lying in bed she does not appear to be uncomfortable.  Her skin is warm and dry.  Eyes visual acuity appears to be intact sclera and conjunctive are clear she has prescription lenses.  Oropharynx is clear mucous membranes moist.  Chest is clear to auscultation there is no labored breathing.  Heart is regular rate and rhythm without murmur gallop or rub she does not have significant lower extremity edema.  Abdomen is soft does not appear to be acutely tender with distraction she does not really complain of abdominal discomfort.  GU- similar to abdominal exam could not really appreciate true discomfort but difficult to fully assess because of her dementia and variable complaints.  Musculoskeletal moves all her extremities x4 at baseline.  Neurologic is grossly intact her speech is clear without lateralizing findings.  Psych she is oriented to self continues to be pleasant  Labs reviewed: Recent Labs    06/02/18 0700 08/28/18 0730 09/15/18 1230  NA 140 138 138  K 3.8 4.4 4.9  CL 111 104 108  CO2 25 26 24   GLUCOSE 85 82 83  BUN 24* 51* 52*  CREATININE 1.00 1.13* 1.40*  CALCIUM 9.0 9.5 9.3   Recent Labs    11/11/17 0700 09/15/18 1230  AST 19 22  ALT 11* 16  ALKPHOS 67 54  BILITOT 0.7 0.8  PROT 6.2* 7.0  ALBUMIN 3.2* 3.9   Recent Labs    06/02/18 0700 08/28/18 0730  09/15/18 1230  WBC 4.8 4.8 4.5  NEUTROABS 2.3 2.6 2.5  HGB 9.2* 9.1* 10.5*  HCT 29.0* 28.9* 33.5*  MCV 102.1* 103.6* 105.0*  PLT 189 186 219   Lab Results  Component Value Date   TSH 2.495 09/15/2018   No results found for: HGBA1C No results found for: CHOL, HDL, LDLCALC, LDLDIRECT, TRIG, CHOLHDL  Significant Diagnostic Results in last 30 days:  No results found.  Assessment/Plan  #1 complaints of "feeling sick" malaise-again somewhat difficult secondary to patient's dementia-but this is the second time this week she has complained of this-- she does not appear to be in any distress will update lab work including a CBC CMP and TSH- also will obtain an abdominal x-ray secondary to vague  complaints of abdominal discomfort as well as a urinalysis and culture secondary to possible suprapubic discomfort-also monitor vital signs pulse ox every shift for 48 hours  Addendum we have received the updated labs which are reassuring- TSH is normal at 2.495.  WBCs are within normal range at 4.5 hemoglobin actually shows improvement at 10.5 platelets are 219  Sodium is 138 potassium 4.9 CO2 is 24 BUN is 52 creatinine 1.4- Creatinine is slightly above baseline which appears to be aboutlower ones  recently- will have fluids encouraged and again will await results of the abdominal x-ray and urine culture  [id number].

## 2018-09-17 LAB — URINE CULTURE: Culture: >=100000 — AB

## 2018-09-18 ENCOUNTER — Encounter (HOSPITAL_COMMUNITY)
Admission: RE | Admit: 2018-09-18 | Discharge: 2018-09-18 | Disposition: A | Discharge status: Home / Self Care | Payer: Medicare Other | Source: Skilled Nursing Facility | Attending: Internal Medicine | Admitting: Internal Medicine

## 2018-09-18 DIAGNOSIS — I129 Hypertensive chronic kidney disease with stage 1 through stage 4 chronic kidney disease, or unspecified chronic kidney disease: Secondary | ICD-10-CM | POA: Insufficient documentation

## 2018-09-18 DIAGNOSIS — F0390 Unspecified dementia without behavioral disturbance: Secondary | ICD-10-CM | POA: Insufficient documentation

## 2018-09-18 LAB — BASIC METABOLIC PANEL
Anion gap: 7 (ref 5–15)
BUN: 47 mg/dL — AB (ref 8–23)
CHLORIDE: 107 mmol/L (ref 98–111)
CO2: 21 mmol/L — ABNORMAL LOW (ref 22–32)
CREATININE: 1.39 mg/dL — AB (ref 0.44–1.00)
Calcium: 8.8 mg/dL — ABNORMAL LOW (ref 8.9–10.3)
GFR calc Af Amer: 37 mL/min — ABNORMAL LOW (ref >60)
GFR calc non Af Amer: 32 mL/min — ABNORMAL LOW (ref >60)
Glucose, Bld: 87 mg/dL (ref 70–99)
Potassium: 4.3 mmol/L (ref 3.5–5.1)
SODIUM: 135 mmol/L (ref 135–145)

## 2018-09-26 ENCOUNTER — Non-Acute Institutional Stay (SKILLED_NURSING_FACILITY): Payer: Medicare Other | Admitting: Internal Medicine

## 2018-09-26 ENCOUNTER — Encounter: Payer: Self-pay | Admitting: Internal Medicine

## 2018-09-26 DIAGNOSIS — I10 Essential (primary) hypertension: Secondary | ICD-10-CM | POA: Diagnosis not present

## 2018-09-26 DIAGNOSIS — N183 Chronic kidney disease, stage 3 unspecified: Secondary | ICD-10-CM

## 2018-09-26 DIAGNOSIS — E875 Hyperkalemia: Secondary | ICD-10-CM

## 2018-09-26 DIAGNOSIS — F0390 Unspecified dementia without behavioral disturbance: Secondary | ICD-10-CM

## 2018-09-26 DIAGNOSIS — D631 Anemia in chronic kidney disease: Secondary | ICD-10-CM

## 2018-09-26 NOTE — Progress Notes (Signed)
Location:    [facility] Room Number: 109/W Place of Service:  SNF [phone]) Provider:  Granville Lewis PA-C  [name], MD  Patient Care Team: [name], MD as PCP - General (Internal Medicine)  Extended Emergency Contact Information Primary Emergency Contact: [name] Address: [address]          [place], [place] [address] [place] Phone: [phone] Relation: Other Secondary Emergency Contact: [name], [place] [address] [name] of [place] Phone: [phone] Mobile Phone: [phone] Relation: Niece  Code Status:  Full Code Goals of care: Advanced Directive information Advanced Directives 09/26/2018  Does Patient Have a Medical Advance Directive? Yes  Type of Advance Directive (No Data)  Does patient want to make changes to medical advance directive? No - Patient declined  Copy of [place] in Chart? No - copy requested  Pre-existing out of facility DNR order (yellow form or pink MOST form) -     Chief Complaint  Patient presents with  . Medical Management of Chronic Issues    Routine visit of medical management   Medical management of chronic medical conditions including dementia-chronic kidney disease with hyperkalemia-hypertension- anemia anxiety as well as osteoporosis HPI:  Pt is a 85 y.o. female seen today for medical management of chronic diseases.  As noted above she appears to be at her baseline today.  She is bright alert somewhat confused but usually pleasantly so at times she will get agitated more at night and needs to be redirected but this has not been a huge issue.  She was most recently seen for malaise-labs were ordered which were unremarkable she was found to have a UTI and this was treated with Keflex.  Regards to dementia she continues on low-dose Aricept again her behaviors have been infrequent and she is easily redirected r-her weight appears to  be relatively stable the last few months at 103.  Regards to chronic kidney disease she is on Kayexalate with concern for hyperkalemia- this has been stable for some time potassium was 4.3 on lab done February 3.  Regards to hypertension she is on labetalol 200 mg twice daily as well as Lopressor 5 mg a day systolics appear to be largely in the low 100s- with occasional systolic in the high 50V  She also has a history of anemia she is on iron there is also an element of chronic disease- hemoglobin has shown stability at 10.5 on lab done on January 31.  Currently she is sitting in her wheelchair comfortably continues to be pleasant alert in good spirits which is her usual presentation     Past Medical History:  Diagnosis Date  . Anemia   . Anxiety   . Dementia ([place])   . Dysphagia, oropharyngeal phase   . GERD (gastroesophageal reflux disease)   . Hyperpotassemia   . Hyperpotassemia   . Hypertension   . Other severe protein-calorie malnutrition    Past Surgical History:  Procedure Laterality Date  . ABDOMINAL HYSTERECTOMY    . APPENDECTOMY      No Known Allergies  Outpatient Encounter Medications as of 09/26/2018  Medication Sig  . amLODipine (NORVASC) 5 MG tablet Take 5 mg by mouth daily. For HTN  . denosumab (PROLIA) 60 MG/ML SOSY injection Inject 60 mg into the skin every 6 (six) months.  . donepezil (ARICEPT) 5 MG tablet Take 5 mg by mouth daily.   Marland Kitchen  Ferrous Sulfate (IRON) 325 (65 Fe) MG TABS Take 1 tablet by mouth once a day  . labetalol (NORMODYNE) 200 MG tablet Take 200 mg by mouth 2 (two) times daily. For HTN  . Probiotic Product (RISA-BID PROBIOTIC) TABS Take 1 tablet by mouth twice a day for 10 days from 09/17/2018-09/26/2018  . ranitidine (ZANTAC) 150 MG tablet Take 150 mg by mouth daily.  . sodium polystyrene (KAYEXALATE) 15 GM/60ML suspension Take 15 g by mouth once. Once a day on Sun,Tue,Thu   No facility-administered encounter medications on file as of 09/26/2018.       Review of Systems   This is limited secondary to dementia.  In general she not complaining of fever chills.  Skin does not complain of rashes or itching.  Head ears eyes nose mouth and throat is not complain of visual changes or sore throat.  Respiratory does not complain of being short of breath or having a cough.  Cardiac does not complain of chest pain or edema.  GI is not complaining at this time of nausea vomiting diarrhea constipation or abdominal pain.  GU does not complain of dysuria.  Musculoskeletal ambulates quite well in a wheelchair does not complain of joint pain.  Neurologic does not complain of dizziness headache numbness could be.  And psych does not appear to be anxious or depressed at times she will have a little nighttime agitation but apparently is easily redirected  Immunization History  Administered Date(s) Administered  . Influenza-Unspecified 05/23/2014, 05/18/2016, 05/20/2017  . Pneumococcal Conjugate-13 11/07/2015  . Pneumococcal-Unspecified 05/25/2016  . Tdap 04/28/2017   Pertinent  Health Maintenance Due  Topic Date Due  . INFLUENZA VACCINE  Completed  . DEXA SCAN  Completed  . PNA vac Low Risk Adult  Completed   Fall Risk  05/16/2018 04/25/2017  Falls in the past year? No No   Functional Status Survey:    Vitals:   09/26/18 1027  BP: (!) 93/59  Pulse: 64  Resp: 16  Temp: 98.7 F (37.1 C)  TempSrc: Oral  SpO2: 97%  Weight: 103 lb (46.7 kg)  Height: 5' 5.5" (1.664 m)  Updated blood pressure is 106/70 Body mass index is 16.88 kg/m. Physical Exam   In general this is a pleasant elderly female in no distress sitting comfortably in her wheelchair.  Her skin is warm and dry.  Eyes visual acuity appears to be intact sclera and conjunctive are clear she has prescription lenses.  Oropharynx is clear mucous membranes moist.  Chest is clear to auscultation there is no labored breathing.  Heart is regular rate and rhythm  without murmur gallop or rub could not really appreciate significant lower extremity edema.  Abdomen is soft nontender with positive bowel sounds.  Musculoskeletal moves all extremities x4 at baseline ambulates well in wheelchair continues to have's a small nonpainful lump upper right arm is chronic.  Neurologic is grossly intact her speech is clear no lateralizing findings.  Psych she is oriented to self pleasant and appropriate  Labs reviewed: Recent Labs    08/28/18 0730 09/15/18 1230 09/18/18 0130  NA 138 138 135  K 4.4 4.9 4.3  CL 104 108 107  CO2 26 24 21*  GLUCOSE 82 83 87  BUN 51* 52* 47*  CREATININE 1.13* 1.40* 1.39*  CALCIUM 9.5 9.3 8.8*   Recent Labs    11/11/17 0700 09/15/18 1230  AST 19 22  ALT 11* 16  ALKPHOS 67 54  BILITOT 0.7 0.8  PROT 6.2*  7.0  ALBUMIN 3.2* 3.9   Recent Labs    06/02/18 0700 08/28/18 0730 09/15/18 1230  WBC 4.8 4.8 4.5  NEUTROABS 2.3 2.6 2.5  HGB 9.2* 9.1* 10.5*  HCT 29.0* 28.9* 33.5*  MCV 102.1* 103.6* 105.0*  PLT 189 186 219   Lab Results  Component Value Date   TSH 2.495 09/15/2018   No results found for: HGBA1C No results found for: CHOL, HDL, LDLCALC, LDLDIRECT, TRIG, CHOLHDL  Significant Diagnostic Results in last 30 days:  No results found.  Assessment/Plan  #1 history of chronic kidney disease with hyperkalemia this appears to be stable per recent lab she does receive Kayexalate 3 times a week creatinine is relatively baseline at 1.39 on lab done earlier this month.  2.  History of dementia this appears stable she is on low-dose Aricept and doing well with supportive care her weight continues to be relatively stable.  3.  History of anemia thought to have some chronicity as well as iron deficiency she is on iron and this appears to be stable as well with a hemoglobin now over 10.  4.  History of hypertension she is on Norvasc 5 mg daily labetalol 200 mg twice daily this appears stable with systolics largely in  the lower 100s.--Occasionally in the high 90s but she is asymptomatic of any hypotension Secondary to patient's advanced age and  For a little less stringent   control we will DC the Norvasc and monitor  5.  History of osteoporosis with T score of -2.8 continues on Prolia appears this is well-tolerated.  6.  History of UTI with malaise this appears to have resolved she did receive antibiotic for Proteus infection husband seems very nice  [phone]

## 2018-10-11 ENCOUNTER — Non-Acute Institutional Stay (SKILLED_NURSING_FACILITY): Payer: Medicare Other | Admitting: Adult Health

## 2018-10-11 ENCOUNTER — Encounter: Payer: Self-pay | Admitting: Adult Health

## 2018-10-11 DIAGNOSIS — R634 Abnormal weight loss: Secondary | ICD-10-CM

## 2018-10-11 DIAGNOSIS — N183 Chronic kidney disease, stage 3 unspecified: Secondary | ICD-10-CM

## 2018-10-11 DIAGNOSIS — D631 Anemia in chronic kidney disease: Secondary | ICD-10-CM

## 2018-10-11 DIAGNOSIS — M810 Age-related osteoporosis without current pathological fracture: Secondary | ICD-10-CM

## 2018-10-11 DIAGNOSIS — E875 Hyperkalemia: Secondary | ICD-10-CM

## 2018-10-11 DIAGNOSIS — K219 Gastro-esophageal reflux disease without esophagitis: Secondary | ICD-10-CM | POA: Diagnosis not present

## 2018-10-11 DIAGNOSIS — I10 Essential (primary) hypertension: Secondary | ICD-10-CM

## 2018-10-11 DIAGNOSIS — F0390 Unspecified dementia without behavioral disturbance: Secondary | ICD-10-CM

## 2018-10-11 NOTE — Progress Notes (Signed)
Location:   [place] Room Number: [place] of Service:  SNF (31)   CODE STATUS: Full Code  No Known Allergies  Chief Complaint  Patient presents with  . Acute Visit    Care Plan Meeting    HPI:  We have come together for her routine care plan meeting. She does not have any family present. She is experiencing slow progressive weight loss. There are no reports of agitation or anxiety present. She does wheel herself around the facility. There are no reports of recent falls; no reports uncontrolled pain.    Past Medical History:  Diagnosis Date  . Anemia   . Anxiety   . Dementia ([place])   . Dysphagia, oropharyngeal phase   . GERD (gastroesophageal reflux disease)   . Hyperpotassemia   . Hyperpotassemia   . Hypertension   . Other severe protein-calorie malnutrition     Past Surgical History:  Procedure Laterality Date  . ABDOMINAL HYSTERECTOMY    . APPENDECTOMY      Social History   Socioeconomic History  . Marital status: Widowed    Spouse name: Not on file  . Number of children: Not on file  . Years of education: Not on file  . Highest education level: Not on file  Occupational History  . Not on file  Social Needs  . Financial resource strain: Not hard at all  . Food insecurity:    Worry: Never true    Inability: Never true  . Transportation needs:    Medical: No    Non-medical: No  Tobacco Use  . Smoking status: Never Smoker  . Smokeless tobacco: Never Used  Substance and Sexual Activity  . Alcohol use: No  . Drug use: No  . Sexual activity: Never  Lifestyle  . Physical activity:    Days per week: 0 days    Minutes per session: 0 min  . Stress: Not at all  Relationships  . Social connections:    Talks on phone: Never    Gets together: Once a week    Attends religious service: Never    Active member of club or organization: No    Attends meetings of clubs or organizations: Never    Relationship status: Widowed    . Intimate partner violence:    Fear of current or ex partner: No    Emotionally abused: No    Physically abused: No    Forced sexual activity: No  Other Topics Concern  . Not on file  Social History Narrative  . Not on file   History reviewed. No pertinent family history.    VITAL SIGNS BP 104/66   Pulse 74   Temp (!) 97.2 F (36.2 C)   Resp 18   Ht 5' 5.5" (1.664 m)   Wt 103 lb (46.7 kg)   SpO2 97%   BMI 16.88 kg/m   Outpatient Encounter Medications as of 10/11/2018  Medication Sig  . amLODipine (NORVASC) 5 MG tablet Take 5 mg by mouth daily. For HTN  . denosumab (PROLIA) 60 MG/ML SOSY injection Inject 60 mg into the skin every 6 (six) months. Give on the 26th of every 6th month  . donepezil (ARICEPT) 5 MG tablet Take 5 mg by mouth daily.   . Ferrous Sulfate (IRON) 325 (65 Fe) MG TABS Take 1 tablet by mouth once a day  . labetalol (NORMODYNE) 200 MG tablet Take 200 mg by mouth 2 (two) times daily. For HTN  .  NON FORMULARY Diet Type:  Mechanical soft Diet  . Nutritional Supplements (ENSURE ENLIVE PO) Take 1 Bottle by mouth 3 (three) times daily with meals.  . ranitidine (ZANTAC) 150 MG tablet Take 150 mg by mouth daily.  . sodium polystyrene (KAYEXALATE) 15 GM/60ML suspension Take 15 g by mouth once. Once a day on Sun,Tue,Thu  . [DISCONTINUED] Probiotic Product (RISA-BID PROBIOTIC) TABS Take 1 tablet by mouth twice a day for 10 days from 09/17/2018-09/26/2018   No facility-administered encounter medications on file as of 10/11/2018.      SIGNIFICANT DIAGNOSTIC EXAMS  LABS REVIEWED TODAY:   09-15-18: wbc 4.5; hgb 10.5; hct 33.5; mcv 105.0; plt 219; glucose 83; bun 52; creat 1.40 ;k+ 4.9; na++ 138; ca 9.3; liver normal albumin 3.9  tsh 2.495; urine culture: proteus mirabilis 09-18-18: glucose 87; bun 47; creat 1.39; k+ 4.3; na++ 135; ca 8.8    Review of Systems  Unable to perform ROS: Dementia (unable to participate )    Physical Exam Constitutional:      General:  She is not in acute distress.    Appearance: She is well-developed. She is not diaphoretic.     Comments: frail  Neck:     Musculoskeletal: Neck supple.     Thyroid: No thyromegaly.  Cardiovascular:     Rate and Rhythm: Normal rate and regular rhythm.     Pulses: Normal pulses.     Heart sounds: Normal heart sounds.  Pulmonary:     Effort: Pulmonary effort is normal. No respiratory distress.     Breath sounds: Normal breath sounds.  Abdominal:     General: Bowel sounds are normal. There is no distension.     Palpations: Abdomen is soft.     Tenderness: There is no abdominal tenderness.  Musculoskeletal:     Right lower leg: No edema.     Left lower leg: No edema.     Comments: Is able to move all extremities Uses wheelchair   Lymphadenopathy:     Cervical: No cervical adenopathy.  Skin:    General: Skin is warm and dry.  Neurological:     Mental Status: She is alert. Mental status is at baseline.  Psychiatric:        Mood and Affect: Mood normal.      ASSESSMENT/ PLAN:  TODAY:   1. Essential hypertension: is stable b/p 104/66: will continue norvasc 5 mg daily labetolol 200 mg twice daily   2. GERD without esophagitis: is stable will continue zantac 150 mg daily   3. Age related osteoporosis without current pathological fracture: is stable will continue prolia 60 mg every 6 months is on supplement   4. Unspecified type dementia without behavioral disturbance: is slowly losing weight: current weight is 103 pounds; will continue aricpet 5 mg daily   5. Stage 3 chronic kidney disease: is without change: bun 47; creat 1.39  6. Chronic hyperkalemia: is stable k+ 4.3; will continue kayexalate 15 gm three times weekly  7.  Anemia due to stage 3 chronic kidney disease: is stable hgb 10.5 will continue iron daily   8. Non-intentional weight loss: is without change her current weight is 103 pounds will continue supplements as directed.         MD is aware of  resident's narcotic use and is in agreement with current plan of care. We will attempt to wean resident as apropriate   [name] [facility]  Contact [phone] Monday through Friday 8am- 5pm  After hours call [phone]

## 2018-10-19 DIAGNOSIS — N183 Chronic kidney disease, stage 3 unspecified: Secondary | ICD-10-CM | POA: Insufficient documentation

## 2018-10-19 DIAGNOSIS — R634 Abnormal weight loss: Secondary | ICD-10-CM | POA: Insufficient documentation

## 2018-10-19 DIAGNOSIS — D631 Anemia in chronic kidney disease: Secondary | ICD-10-CM | POA: Insufficient documentation

## 2018-10-27 ENCOUNTER — Non-Acute Institutional Stay (SKILLED_NURSING_FACILITY): Payer: Medicare Other | Admitting: Adult Health

## 2018-10-27 ENCOUNTER — Encounter: Payer: Self-pay | Admitting: Adult Health

## 2018-10-27 DIAGNOSIS — F0390 Unspecified dementia without behavioral disturbance: Secondary | ICD-10-CM | POA: Diagnosis not present

## 2018-10-27 DIAGNOSIS — E875 Hyperkalemia: Secondary | ICD-10-CM | POA: Diagnosis not present

## 2018-10-27 DIAGNOSIS — N183 Chronic kidney disease, stage 3 unspecified: Secondary | ICD-10-CM

## 2018-10-27 NOTE — Progress Notes (Signed)
Location:    [facility] Room Number: 109/W Place of Service:  SNF (31)   CODE STATUS: Full Code  No Known Allergies  Chief Complaint  Patient presents with  . Medical Management of Chronic Issues    Stage 3 chronic kidney disease; chronic hyperkalemia; dementia without behavioral disturbance, unspecified dementia type     HPI:  She is a 85 year old long term resident of this facility beign seen for the management of her chronic illnesses: chronic kidney disease; hyperkalemia; dementia. There are no reports of changes in appetite; no reports of anxiety or agitation. No reports of fevers present.   Past Medical History:  Diagnosis Date  . Anemia   . Anxiety   . Dementia ([place])   . Dysphagia, oropharyngeal phase   . GERD (gastroesophageal reflux disease)   . Hyperpotassemia   . Hyperpotassemia   . Hypertension   . Other severe protein-calorie malnutrition     Past Surgical History:  Procedure Laterality Date  . ABDOMINAL HYSTERECTOMY    . APPENDECTOMY      Social History   Socioeconomic History  . Marital status: Widowed    Spouse name: Not on file  . Number of children: Not on file  . Years of education: Not on file  . Highest education level: Not on file  Occupational History  . Not on file  Social Needs  . Financial resource strain: Not hard at all  . Food insecurity:    Worry: Never true    Inability: Never true  . Transportation needs:    Medical: No    Non-medical: No  Tobacco Use  . Smoking status: Never Smoker  . Smokeless tobacco: Never Used  Substance and Sexual Activity  . Alcohol use: No  . Drug use: No  . Sexual activity: Never  Lifestyle  . Physical activity:    Days per week: 0 days    Minutes per session: 0 min  . Stress: Not at all  Relationships  . Social connections:    Talks on phone: Never    Gets together: Once a week    Attends religious service: Never    Active member of club or organization: No   Attends meetings of clubs or organizations: Never    Relationship status: Widowed  . Intimate partner violence:    Fear of current or ex partner: No    Emotionally abused: No    Physically abused: No    Forced sexual activity: No  Other Topics Concern  . Not on file  Social History Narrative  . Not on file   History reviewed. No pertinent family history.    VITAL SIGNS BP 110/63   Pulse 71   Temp (!) 97.4 F (36.3 C) (Oral)   Resp 20   Ht 5' 5.5" (1.664 m)   Wt 103 lb 3.2 oz (46.8 kg)   SpO2 97%   BMI 16.91 kg/m   Outpatient Encounter Medications as of 10/27/2018  Medication Sig  . denosumab (PROLIA) 60 MG/ML SOSY injection Inject 60 mg into the skin every 6 (six) months. Give on the 26th of every 6th month  . donepezil (ARICEPT) 5 MG tablet Take 5 mg by mouth daily.   . Ferrous Sulfate (IRON) 325 (65 Fe) MG TABS Take 1 tablet by mouth once a day  . labetalol (NORMODYNE) 200 MG tablet Take 200 mg by mouth 2 (two) times daily. For HTN  . NON FORMULARY Diet Type:  Mechanical  soft Diet  . Nutritional Supplements (ENSURE ENLIVE PO) Take 1 Bottle by mouth 3 (three) times daily with meals.  . ranitidine (ZANTAC) 150 MG tablet Take 150 mg by mouth daily.  . sodium polystyrene (KAYEXALATE) 15 GM/60ML suspension Take 15 g by mouth once. Once a day on Sun,Tue,Thu  . [DISCONTINUED] amLODipine (NORVASC) 5 MG tablet Take 5 mg by mouth daily. For HTN   No facility-administered encounter medications on file as of 10/27/2018.      SIGNIFICANT DIAGNOSTIC EXAMS   LABS REVIEWED PREVIOUS:   09-15-18: wbc 4.5; hgb 10.5; hct 33.5; mcv 105.0; plt 219; glucose 83; bun 52; creat 1.40 ;k+ 4.9; na++ 138; ca 9.3; liver normal albumin 3.9  tsh 2.495; urine culture: proteus mirabilis 09-18-18: glucose 87; bun 47; creat 1.39; k+ 4.3; na++ 135; ca 8.8   NO NEW LABS.    Review of Systems  Unable to perform ROS: Dementia (unable to participate )   Physical Exam Constitutional:      General: She  is not in acute distress.    Appearance: She is well-developed. She is not diaphoretic.     Comments: Frail   Neck:     Musculoskeletal: Neck supple.     Thyroid: No thyromegaly.  Cardiovascular:     Rate and Rhythm: Normal rate and regular rhythm.     Pulses: Normal pulses.     Heart sounds: Normal heart sounds.  Pulmonary:     Effort: Pulmonary effort is normal. No respiratory distress.     Breath sounds: Normal breath sounds.  Abdominal:     General: Bowel sounds are normal. There is no distension.     Palpations: Abdomen is soft.     Tenderness: There is no abdominal tenderness.  Musculoskeletal:     Right lower leg: No edema.     Left lower leg: No edema.     Comments:  Is able to move all extremities Uses wheelchair   Lymphadenopathy:     Cervical: No cervical adenopathy.  Skin:    General: Skin is warm and dry.  Neurological:     Mental Status: She is alert. Mental status is at baseline.  Psychiatric:        Mood and Affect: Mood normal.      ASSESSMENT/ PLAN:  TODAY:   1. Unspecified type dementia without behavioral disturbance: is slowly losing weight current weight is 103 pounds will continue aricept 5 mg daily   2. Stage 3 chronic kidney disease: is without change bun 47; creat 1.39;   3. Chronic hyperkalemia: is stable k+ 4.3; will continue kayexalate 15 gm three times weekly   PREVIOUS   4. Essential hypertension: is stable b/p 104/66: will continue norvasc 5 mg daily labetolol 200 mg twice daily   5. GERD without esophagitis: is stable will continue zantac 150 mg daily   6. Age related osteoporosis without current pathological fracture: is stable will continue prolia 60 mg every 6 months is on supplement   7.  Anemia due to stage 3 chronic kidney disease: is stable hgb 10.5 will continue iron daily   8. Non-intentional weight loss: is without change her current weight is 103 pounds will continue supplements as directed.     MD is aware of  resident's narcotic use and is in agreement with current plan of care. We will attempt to wean resident as apropriate   [name] [facility]  Contact [phone] Monday through Friday 8am- 5pm  After hours call  [phone]  

## 2018-11-15 ENCOUNTER — Encounter: Payer: Self-pay | Admitting: Internal Medicine

## 2018-11-15 NOTE — Progress Notes (Deleted)
Location:  [place] Room Number: [place] of Service:  SNF [phone]) Provider:  Veleta Miners, MD  [name], MD  Patient Care Team: [name], MD as PCP - General (Internal Medicine)  Extended Emergency Contact Information Primary Emergency Contact: [name] Address: [address]          [place], [place] [address] [place] Phone: [phone] Relation: Other Secondary Emergency Contact: [name], [place] [address] [name] of [place] Phone: [phone] Mobile Phone: [phone] Relation: Niece  Code Status:  Full code Goals of care: Advanced Directive information Advanced Directives 11/15/2018  Does Patient Have a Medical Advance Directive? No  Type of Advance Directive -  Does patient want to make changes to medical advance directive? No - Patient declined  Copy of [place] in Chart? -  Would patient like information on creating a medical advance directive? No - Patient declined  Pre-existing out of facility DNR order (yellow form or pink MOST form) -     Chief Complaint  Patient presents with  . Medical Management of Chronic Issues    Hypertension, GERD    HPI:  Pt is a 85 y.o. female seen today for medical management of chronic diseases.     Past Medical History:  Diagnosis Date  . Anemia   . Anxiety   . Dementia ([place])   . Dysphagia, oropharyngeal phase   . GERD (gastroesophageal reflux disease)   . Hyperpotassemia   . Hyperpotassemia   . Hypertension   . Other severe protein-calorie malnutrition    Past Surgical History:  Procedure Laterality Date  . ABDOMINAL HYSTERECTOMY    . APPENDECTOMY      No Known Allergies  Outpatient Encounter Medications as of 11/15/2018  Medication Sig  . denosumab (PROLIA) 60 MG/ML SOSY injection Inject 60 mg into the skin every 6 (six) months. Give on the 26th of every 6th month  . donepezil (ARICEPT) 5 MG tablet  Take 5 mg by mouth daily.   . Ferrous Sulfate (IRON) 325 (65 Fe) MG TABS Take 1 tablet by mouth once a day  . labetalol (NORMODYNE) 200 MG tablet Take 200 mg by mouth 2 (two) times daily. For HTN  . NON FORMULARY Diet Type:  Mechanical soft Diet  . Nutritional Supplements (ENSURE ENLIVE PO) Take 1 Bottle by mouth 3 (three) times daily with meals.  . ranitidine (ZANTAC) 150 MG tablet Take 150 mg by mouth daily.  . sodium polystyrene (KAYEXALATE) 15 GM/60ML suspension Take 15 g by mouth once. Once a day on Sun,Tue,Thu   No facility-administered encounter medications on file as of 11/15/2018.     Review of Systems  Immunization History  Administered Date(s) Administered  . Influenza-Unspecified 05/23/2014, 05/18/2016, 05/20/2017, 05/18/2018  . Pneumococcal Conjugate-13 11/07/2015  . Pneumococcal-Unspecified 05/25/2016  . Tdap 04/28/2017   Pertinent  Health Maintenance Due  Topic Date Due  . INFLUENZA VACCINE  03/17/2019  . DEXA SCAN  Completed  . PNA vac Low Risk Adult  Completed   Fall Risk  05/16/2018 04/25/2017  Falls in the past year? No No   Functional Status Survey:    Vitals:   11/15/18 1153  BP: (!) 119/52  Pulse: 67  Resp: 18  Temp: (!) 96.9 F (36.1 C)  Weight: 103 lb 3.2 oz (46.8 kg)  Height: 5' 5.5" (1.664 m)   Body mass index is 16.[place]  kg/m. Physical Exam  Labs reviewed: Recent Labs    08/28/18 0730 09/15/18 1230 09/18/18 0130  NA 138 138 135  K 4.4 4.9 4.3  CL 104 108 107  CO2 26 24 21*  GLUCOSE 82 83 87  BUN 51* 52* 47*  CREATININE 1.13* 1.40* 1.39*  CALCIUM 9.5 9.3 8.8*   Recent Labs    09/15/18 1230  AST 22  ALT 16  ALKPHOS 54  BILITOT 0.8  PROT 7.0  ALBUMIN 3.9   Recent Labs    06/02/18 0700 08/28/18 0730 09/15/18 1230  WBC 4.8 4.8 4.5  NEUTROABS 2.3 2.6 2.5  HGB 9.2* 9.1* 10.5*  HCT 29.0* 28.9* 33.5*  MCV 102.1* 103.6* 105.0*  PLT 189 186 219   Lab Results  Component Value Date   TSH 2.495 09/15/2018   No results found  for: HGBA1C No results found for: CHOL, HDL, LDLCALC, LDLDIRECT, TRIG, CHOLHDL  Significant Diagnostic Results in last 30 days:  No results found.  Assessment/Plan There are no diagnoses linked to this encounter.   Family/ staff Communication: ***  Labs/tests ordered:  ***

## 2018-11-16 ENCOUNTER — Non-Acute Institutional Stay (SKILLED_NURSING_FACILITY): Payer: Medicare Other | Admitting: Internal Medicine

## 2018-11-16 ENCOUNTER — Encounter: Payer: Self-pay | Admitting: Internal Medicine

## 2018-11-16 DIAGNOSIS — I10 Essential (primary) hypertension: Secondary | ICD-10-CM

## 2018-11-16 DIAGNOSIS — M810 Age-related osteoporosis without current pathological fracture: Secondary | ICD-10-CM

## 2018-11-16 DIAGNOSIS — F0390 Unspecified dementia without behavioral disturbance: Secondary | ICD-10-CM

## 2018-11-16 DIAGNOSIS — K219 Gastro-esophageal reflux disease without esophagitis: Secondary | ICD-10-CM | POA: Diagnosis not present

## 2018-11-16 NOTE — Progress Notes (Signed)
Entered in error

## 2018-11-16 NOTE — Progress Notes (Signed)
Location:  [name] Room Number: [place] of Service:  SNF [phone]) Provider:  Veleta Miners, MD  [name], MD  Patient Care Team: [name], MD as PCP - General (Internal Medicine)  Extended Emergency Contact Information Primary Emergency Contact: [name] Address: [address]          [place], [place] [address] [place] Phone: [phone] Relation: Other Secondary Emergency Contact: [name],  [address] [name] of [place] Phone: [phone] Mobile Phone: [phone] Relation: Niece  Code Status:  Full Code Goals of care: Advanced Directive information Advanced Directives 11/15/2018  Does Patient Have a Medical Advance Directive? No  Type of Advance Directive -  Does patient want to make changes to medical advance directive? No - Patient declined  Copy of [place] in Chart? -  Would patient like information on creating a medical advance directive? No - Patient declined  Pre-existing out of facility DNR order (yellow form or pink MOST form) -     Chief Complaint  Patient presents with  . Medical Management of Chronic Issues    Hypertension, GERD    HPI:  Pt is a 85 y.o. female seen today for medical management of chronic diseases.   Patient has h/o Hyperkalemia secondary to Chronic renal insufficiency, hypertension, anemia secondary to renal diseaseAnd dementia  Patient has been stable in the facility. Weight is 102 lbswhich is slightly less than her previous weight. Per nurses she is eating well.  Patient has dementia.  She did not have any complaints.  She wheels around in the facility in her wheelchair  Past Medical History:  Diagnosis Date  . Anemia   . Anxiety   . Dementia ([place])   . Dysphagia, oropharyngeal phase   . GERD (gastroesophageal reflux disease)   . Hyperpotassemia   . Hyperpotassemia   . Hypertension   . Other severe  protein-calorie malnutrition    Past Surgical History:  Procedure Laterality Date  . ABDOMINAL HYSTERECTOMY    . APPENDECTOMY      No Known Allergies  Outpatient Encounter Medications as of 11/16/2018  Medication Sig  . denosumab (PROLIA) 60 MG/ML SOSY injection Inject 60 mg into the skin every 6 (six) months. Give on the 26th of every 6th month  . donepezil (ARICEPT) 5 MG tablet Take 5 mg by mouth daily.   . Ferrous Sulfate (IRON) 325 (65 Fe) MG TABS Take 1 tablet by mouth once a day  . labetalol (NORMODYNE) 200 MG tablet Take 200 mg by mouth 2 (two) times daily. For HTN  . NON FORMULARY Diet Type:  Mechanical soft Diet  . Nutritional Supplements (ENSURE ENLIVE PO) Take 1 Bottle by mouth 3 (three) times daily with meals.  . ranitidine (ZANTAC) 150 MG tablet Take 150 mg by mouth daily.  . sodium polystyrene (KAYEXALATE) 15 GM/60ML suspension Take 15 g by mouth once. Once a day on Sun,Tue,Thu   No facility-administered encounter medications on file as of 11/16/2018.     Review of Systems  Unable to perform ROS: Dementia     Immunization History  Administered Date(s) Administered  . Influenza-Unspecified 05/23/2014, 05/18/2016, 05/20/2017, 05/18/2018  . Pneumococcal Conjugate-13 11/07/2015  . Pneumococcal-Unspecified 05/25/2016  . Tdap 04/28/2017   Pertinent  Health Maintenance Due  Topic Date Due  . INFLUENZA VACCINE  03/17/2019  . DEXA SCAN  Completed  . PNA vac Low  Risk Adult  Completed   Fall Risk  05/16/2018 04/25/2017  Falls in the past year? No No   Functional Status Survey:    Vitals:   11/16/18 1126  BP: 140/85  Pulse: (!) 58  Resp: 18  Temp: (!) 97.5 F (36.4 C)  Weight: 102 lb 9.6 oz (46.5 kg)  Height: 5' 5.5" (1.664 m)   Body mass index is 16.81 kg/m. Physical Exam Vitals signs reviewed.  Constitutional:      Appearance: She is normal weight.  HENT:     Head: Normocephalic.     Nose: Nose normal.     Mouth/Throat:     Mouth: Mucous membranes are  moist.     Pharynx: Oropharynx is clear.  Eyes:     Pupils: Pupils are equal, round, and reactive to light.  Neck:     Musculoskeletal: Neck supple.  Cardiovascular:     Rate and Rhythm: Normal rate and regular rhythm.     Pulses: Normal pulses.     Heart sounds: Normal heart sounds.  Pulmonary:     Effort: Pulmonary effort is normal.     Breath sounds: Normal breath sounds.  Abdominal:     General: Abdomen is flat. Bowel sounds are normal.     Palpations: Abdomen is soft.  Skin:    General: Skin is warm and dry.  Neurological:     General: No focal deficit present.     Mental Status: She is alert.  Psychiatric:        Mood and Affect: Mood normal.        Thought Content: Thought content normal.     Comments: Mostly follows all commands. Not oriented Wheelchair dependent     Labs reviewed: Recent Labs    08/28/18 0730 09/15/18 1230 09/18/18 0130  NA 138 138 135  K 4.4 4.9 4.3  CL 104 108 107  CO2 26 24 21*  GLUCOSE 82 83 87  BUN 51* 52* 47*  CREATININE 1.13* 1.40* 1.39*  CALCIUM 9.5 9.3 8.8*   Recent Labs    09/15/18 1230  AST 22  ALT 16  ALKPHOS 54  BILITOT 0.8  PROT 7.0  ALBUMIN 3.9   Recent Labs    06/02/18 0700 08/28/18 0730 09/15/18 1230  WBC 4.8 4.8 4.5  NEUTROABS 2.3 2.6 2.5  HGB 9.2* 9.1* 10.5*  HCT 29.0* 28.9* 33.5*  MCV 102.1* 103.6* 105.0*  PLT 189 186 219   Lab Results  Component Value Date   TSH 2.495 09/15/2018   No results found for: HGBA1C No results found for: CHOL, HDL, LDLCALC, LDLDIRECT, TRIG, CHOLHDL  Significant Diagnostic Results in last 30 days:  No results found.  Assessment/Plan  Essential hypertension BP Stable on  Labetolol  Age-related osteoporosis On ProliaTscore was - 2.8  Stage 3 chronic kidney disease Creat Stable  Hyperkalemia On Kayexalate  Anemia due to stage 3 chronic kidney disease Hgb Stable On iron  Dementia On Aricept ACP D/W social Worker for talking to POA to review  her Code status. Her niece is her POA   Pharmacist, hospital Communication:   Labs/tests ordered:   Total time spent in this patient care encounter was 25_ minutes; greater than 50% of the visit spent counseling patien tand staff, reviewing records , Labs and coordinating care for problems addressed at this encounter.

## 2018-12-15 ENCOUNTER — Non-Acute Institutional Stay (SKILLED_NURSING_FACILITY): Payer: Medicare Other | Admitting: Adult Health

## 2018-12-15 ENCOUNTER — Encounter: Payer: Self-pay | Admitting: Adult Health

## 2018-12-15 DIAGNOSIS — K219 Gastro-esophageal reflux disease without esophagitis: Secondary | ICD-10-CM

## 2018-12-15 DIAGNOSIS — M810 Age-related osteoporosis without current pathological fracture: Secondary | ICD-10-CM | POA: Diagnosis not present

## 2018-12-15 DIAGNOSIS — I10 Essential (primary) hypertension: Secondary | ICD-10-CM | POA: Diagnosis not present

## 2018-12-15 NOTE — Progress Notes (Signed)
Location:   [facility] Room Number: [place] of Service:  SNF (31)   CODE STATUS: Full Code  No Known Allergies  Chief Complaint  Patient presents with  . Medical Management of Chronic Issues    Essential hypertension; gastroesophageal reflux disease without esophagitis; age related osteoporosis without current pathological fracture.     HPI:  She is a 85 year old long term resident of this facility being seen for the management of her chronic illnesses; hypertension; gerd osteoporosis. There are no reports of changes in appetite; no uncontrolled pain no reports of agitation or anxiety. No reports of fevers.   Past Medical History:  Diagnosis Date  . Anemia   . Anxiety   . Dementia ([place])   . Dysphagia, oropharyngeal phase   . GERD (gastroesophageal reflux disease)   . Hyperpotassemia   . Hyperpotassemia   . Hypertension   . Other severe protein-calorie malnutrition     Past Surgical History:  Procedure Laterality Date  . ABDOMINAL HYSTERECTOMY    . APPENDECTOMY      Social History   Socioeconomic History  . Marital status: Widowed    Spouse name: Not on file  . Number of children: Not on file  . Years of education: Not on file  . Highest education level: Not on file  Occupational History  . Not on file  Social Needs  . Financial resource strain: Not hard at all  . Food insecurity:    Worry: Never true    Inability: Never true  . Transportation needs:    Medical: No    Non-medical: No  Tobacco Use  . Smoking status: Never Smoker  . Smokeless tobacco: Never Used  Substance and Sexual Activity  . Alcohol use: No  . Drug use: No  . Sexual activity: Never  Lifestyle  . Physical activity:    Days per week: 0 days    Minutes per session: 0 min  . Stress: Not at all  Relationships  . Social connections:    Talks on phone: Never    Gets together: Once a week    Attends religious service: Never    Active member of club or  organization: No    Attends meetings of clubs or organizations: Never    Relationship status: Widowed  . Intimate partner violence:    Fear of current or ex partner: No    Emotionally abused: No    Physically abused: No    Forced sexual activity: No  Other Topics Concern  . Not on file  Social History Narrative  . Not on file   History reviewed. No pertinent family history.    VITAL SIGNS BP (!) 162/52   Pulse 65   Temp 98.3 F (36.8 C)   Resp 20   Ht 5' 5.5" (1.664 m)   Wt 102 lb 9.6 oz (46.5 kg)   BMI 16.81 kg/m   Outpatient Encounter Medications as of 12/15/2018  Medication Sig  . denosumab (PROLIA) 60 MG/ML SOSY injection Inject 60 mg into the skin every 6 (six) months. Give on the 26th of every 6th month  . donepezil (ARICEPT) 5 MG tablet Take 5 mg by mouth daily.   . famotidine (PEPCID) 20 MG tablet Take 20 mg by mouth daily.  . Ferrous Sulfate (IRON) 325 (65 Fe) MG TABS Take 1 tablet by mouth once a day  . labetalol (NORMODYNE) 200 MG tablet Take 200 mg by mouth 2 (two) times  daily. For HTN  . NON FORMULARY Diet Type:  Mechanical soft Diet  . Nutritional Supplements (ENSURE ENLIVE PO) Take 1 Bottle by mouth 3 (three) times daily with meals.  . sodium polystyrene (KAYEXALATE) 15 GM/60ML suspension Take 15 g by mouth once. Once a day on Sun,Tue,Thu  . [DISCONTINUED] ranitidine (ZANTAC) 150 MG tablet Take 150 mg by mouth daily.   No facility-administered encounter medications on file as of 12/15/2018.      SIGNIFICANT DIAGNOSTIC EXAMS  LABS REVIEWED PREVIOUS:   09-15-18: wbc 4.5; hgb 10.5; hct 33.5; mcv 105.0; plt 219; glucose 83; bun 52; creat 1.40 ;k+ 4.9; na++ 138; ca 9.3; liver normal albumin 3.9  tsh 2.495; urine culture: proteus mirabilis 09-18-18: glucose 87; bun 47; creat 1.39; k+ 4.3; na++ 135; ca 8.8   NO NEW LABS.   Review of Systems  Unable to perform ROS: Dementia (unable to participate )   Physical Exam Constitutional:      General: She is not in  acute distress.    Appearance: She is underweight. She is not diaphoretic.  Neck:     Musculoskeletal: Neck supple.     Thyroid: No thyromegaly.  Cardiovascular:     Rate and Rhythm: Normal rate and regular rhythm.     Pulses: Normal pulses.     Heart sounds: Normal heart sounds.  Pulmonary:     Effort: Pulmonary effort is normal. No respiratory distress.     Breath sounds: Normal breath sounds.  Abdominal:     General: Bowel sounds are normal. There is no distension.     Palpations: Abdomen is soft.     Tenderness: There is no abdominal tenderness.  Musculoskeletal:     Right lower leg: No edema.     Left lower leg: No edema.     Comments: Is able to move all extremities Uses wheelchair   Lymphadenopathy:     Cervical: No cervical adenopathy.  Skin:    General: Skin is warm and dry.  Neurological:     Mental Status: She is alert. Mental status is at baseline.  Psychiatric:        Mood and Affect: Mood normal.        ASSESSMENT/ PLAN:  TODAY:   1. Essential hypertension: is stable b/p 162/52 will continue labetolol 200 mg twice daily is off norvasc  2. GERD without esophagitis: is stable will continue pepcid 20 mg daily  3. Age related osteoporosis without current pathological fracture is stable will continue prolia 60 mg every 6 months.   PREVIOUS   4.  Anemia due to stage 3 chronic kidney disease: is stable hgb 10.5 will continue iron daily   5. Non-intentional weight loss: is without change her current weight is 102 (previus 103) pounds will continue supplements as directed.   6. Unspecified type dementia without behavioral disturbance: is slowly losing weight current weight is 102 (previous 103) pounds will continue aricept 5 mg daily   7. Stage 3 chronic kidney disease: is without change bun 47; creat 1.39;   8. Chronic hyperkalemia: is stable k+ 4.3; will continue kayexalate 15 gm three times weekly     MD is aware of resident's narcotic use and is in  agreement with current plan of care. We will attempt to wean resident as apropriate   [name] [facility]  Contact [phone] Monday through Friday 8am- 5pm  After hours call [phone]

## 2019-01-10 ENCOUNTER — Encounter: Payer: Self-pay | Admitting: Adult Health

## 2019-01-10 ENCOUNTER — Non-Acute Institutional Stay (SKILLED_NURSING_FACILITY): Payer: Medicare Other | Admitting: Adult Health

## 2019-01-10 DIAGNOSIS — F0390 Unspecified dementia without behavioral disturbance: Secondary | ICD-10-CM | POA: Diagnosis not present

## 2019-01-10 DIAGNOSIS — K219 Gastro-esophageal reflux disease without esophagitis: Secondary | ICD-10-CM

## 2019-01-10 DIAGNOSIS — I10 Essential (primary) hypertension: Secondary | ICD-10-CM | POA: Diagnosis not present

## 2019-01-10 NOTE — Progress Notes (Signed)
Location:   [place] Room Number: [place] of Service:  SNF (31)   CODE STATUS: Full Code  No Known Allergies  Chief Complaint  Patient presents with  . Acute Visit    Care Plan Meeting    HPI:  We have come together for her routine care plan meeting. Her weight is stable; her BIMS is 6/15. There are no reports of falls. There are no reports of uncontrolled pain; no changes in her appetite; no reports of anxiety or agitation. She continues to be followed for her chronic illnesses including: dementia; gerd; hypertension.    Past Medical History:  Diagnosis Date  . Anemia   . Anxiety   . Dementia ([place])   . Dysphagia, oropharyngeal phase   . GERD (gastroesophageal reflux disease)   . Hyperpotassemia   . Hyperpotassemia   . Hypertension   . Other severe protein-calorie malnutrition     Past Surgical History:  Procedure Laterality Date  . ABDOMINAL HYSTERECTOMY    . APPENDECTOMY      Social History   Socioeconomic History  . Marital status: Widowed    Spouse name: Not on file  . Number of children: Not on file  . Years of education: Not on file  . Highest education level: Not on file  Occupational History  . Not on file  Social Needs  . Financial resource strain: Not hard at all  . Food insecurity:    Worry: Never true    Inability: Never true  . Transportation needs:    Medical: No    Non-medical: No  Tobacco Use  . Smoking status: Never Smoker  . Smokeless tobacco: Never Used  Substance and Sexual Activity  . Alcohol use: No  . Drug use: No  . Sexual activity: Never  Lifestyle  . Physical activity:    Days per week: 0 days    Minutes per session: 0 min  . Stress: Not at all  Relationships  . Social connections:    Talks on phone: Never    Gets together: Once a week    Attends religious service: Never    Active member of club or organization: No    Attends meetings of clubs or organizations: Never   Relationship status: Widowed  . Intimate partner violence:    Fear of current or ex partner: No    Emotionally abused: No    Physically abused: No    Forced sexual activity: No  Other Topics Concern  . Not on file  Social History Narrative  . Not on file   History reviewed. No pertinent family history.    VITAL SIGNS BP 118/74   Pulse 68   Temp (!) 97.1 F (36.2 C)   Resp 18   Ht 5' 5.5" (1.664 m)   Wt 104 lb 6.4 oz (47.4 kg)   BMI 17.11 kg/m   Outpatient Encounter Medications as of 01/10/2019  Medication Sig  . denosumab (PROLIA) 60 MG/ML SOSY injection Inject 60 mg into the skin every 6 (six) months. Give on the 26th of every 6th month  . donepezil (ARICEPT) 5 MG tablet Take 5 mg by mouth daily.   . famotidine (PEPCID) 20 MG tablet Take 20 mg by mouth daily.  . Ferrous Sulfate (IRON) 325 (65 Fe) MG TABS Take 1 tablet by mouth once a day  . hydrocortisone (ANUSOL-HC) 2.5 % rectal cream Apply 1 application topically 2 (two) times daily as needed for hemorrhoids or  anal itching.  . labetalol (NORMODYNE) 200 MG tablet Take 200 mg by mouth 2 (two) times daily. For HTN  . NON FORMULARY Diet Type:  Mechanical soft Diet  . Nutritional Supplements (ENSURE ENLIVE PO) Take 1 Bottle by mouth 3 (three) times daily with meals.  . sodium polystyrene (KAYEXALATE) 15 GM/60ML suspension Take 15 g by mouth once. Once a day on Sun,Tue,Thu   No facility-administered encounter medications on file as of 01/10/2019.      SIGNIFICANT DIAGNOSTIC EXAMS  LABS REVIEWED PREVIOUS:   09-15-18: wbc 4.5; hgb 10.5; hct 33.5; mcv 105.0; plt 219; glucose 83; bun 52; creat 1.40 ;k+ 4.9; na++ 138; ca 9.3; liver normal albumin 3.9  tsh 2.495; urine culture: proteus mirabilis 09-18-18: glucose 87; bun 47; creat 1.39; k+ 4.3; na++ 135; ca 8.8   NO NEW LABS.    Review of Systems  Unable to perform ROS: Dementia (unable to participate )    Physical Exam Constitutional:      General: She is not in acute  distress.    Appearance: She is underweight. She is not diaphoretic.  Neck:     Musculoskeletal: Neck supple.     Thyroid: No thyromegaly.  Cardiovascular:     Rate and Rhythm: Normal rate and regular rhythm.     Pulses: Normal pulses.     Heart sounds: Normal heart sounds.  Pulmonary:     Effort: Pulmonary effort is normal. No respiratory distress.     Breath sounds: Normal breath sounds.  Abdominal:     General: Bowel sounds are normal. There is no distension.     Palpations: Abdomen is soft.     Tenderness: There is no abdominal tenderness.  Musculoskeletal:     Right lower leg: No edema.     Left lower leg: No edema.     Comments: Is able to move all extremities Uses wheelchair  Lymphadenopathy:     Cervical: No cervical adenopathy.  Skin:    General: Skin is warm and dry.  Neurological:     Mental Status: She is alert. Mental status is at baseline.  Psychiatric:        Mood and Affect: Mood normal.     ASSESSMENT/ PLAN:  TODAY:   1. Essential hypertension: 2. GERD  3. Unspecified type dementia without behavioral disturbance:  Will continue her current medications;  Will continue her current plan of care Will continue to monitor her status.      MD is aware of resident's narcotic use and is in agreement with current plan of care. We will attempt to wean resident as apropriate   [name] [facility]  Contact [phone] Monday through Friday 8am- 5pm  After hours call [phone]

## 2019-02-15 ENCOUNTER — Non-Acute Institutional Stay (SKILLED_NURSING_FACILITY): Payer: Medicare Other | Admitting: Adult Health

## 2019-02-15 ENCOUNTER — Encounter: Payer: Self-pay | Admitting: Adult Health

## 2019-02-15 DIAGNOSIS — N183 Chronic kidney disease, stage 3 unspecified: Secondary | ICD-10-CM

## 2019-02-15 DIAGNOSIS — F0390 Unspecified dementia without behavioral disturbance: Secondary | ICD-10-CM

## 2019-02-15 DIAGNOSIS — R634 Abnormal weight loss: Secondary | ICD-10-CM

## 2019-02-15 DIAGNOSIS — D631 Anemia in chronic kidney disease: Secondary | ICD-10-CM

## 2019-02-15 NOTE — Progress Notes (Signed)
Location:    [place] Room Number: 109/W Place of Service:  SNF (31)   CODE STATUS: Full Code  No Known Allergies  Chief Complaint  Patient presents with  . Medical Management of Chronic Issues       Anemia due to stage 3 chronic kidney disease:  Non-intentional weight loss: Unspecified type dementia without behavioral disturbance;    HPI:  Morgan [name] is a 85 year old long term resident of this facility being seen for the management of her chronic illnesses: anemia; weight loss and dementia. Her weight is stable. There are no reports of changes in her appetite; no reports of uncontrolled pain; no reports of anxiety or agitation.   Past Medical History:  Diagnosis Date  . Anemia   . Anxiety   . Dementia ([place])   . Dysphagia, oropharyngeal phase   . GERD (gastroesophageal reflux disease)   . Hyperpotassemia   . Hyperpotassemia   . Hypertension   . Other severe protein-calorie malnutrition     Past Surgical History:  Procedure Laterality Date  . ABDOMINAL HYSTERECTOMY    . APPENDECTOMY      Social History   Socioeconomic History  . Marital status: Widowed    Spouse name: Not on file  . Number of children: Not on file  . Years of education: Not on file  . Highest education level: Not on file  Occupational History  . Not on file  Social Needs  . Financial resource strain: Not hard at all  . Food insecurity    Worry: Never true    Inability: Never true  . Transportation needs    Medical: No    Non-medical: No  Tobacco Use  . Smoking status: Never Smoker  . Smokeless tobacco: Never Used  Substance and Sexual Activity  . Alcohol use: No  . Drug use: No  . Sexual activity: Never  Lifestyle  . Physical activity    Days per week: 0 days    Minutes per session: 0 min  . Stress: Not at all  Relationships  . Social Herbalist on phone: Never    Gets together: Once a week    Attends religious service: Never    Active member of club  or organization: No    Attends meetings of clubs or organizations: Never    Relationship status: Widowed  . Intimate partner violence    Fear of current or ex partner: No    Emotionally abused: No    Physically abused: No    Forced sexual activity: No  Other Topics Concern  . Not on file  Social History Narrative  . Not on file   History reviewed. No pertinent family history.    VITAL SIGNS BP 132/69   Pulse 87   Temp (!) 97 F (36.1 C) (Oral)   Resp 18   Ht 5' 5.5" (1.664 m)   Wt 102 lb 3.2 oz (46.4 kg)   SpO2 97%   BMI 16.75 kg/m   Outpatient Encounter Medications as of 02/15/2019  Medication Sig  . calcium carbonate (TUMS - DOSED IN MG ELEMENTAL CALCIUM) 500 MG chewable tablet Chew 1 tablet by mouth 3 (three) times daily with meals.  [place] denosumab (PROLIA) 60 MG/ML SOSY injection Inject 60 mg into the skin every 6 (six) months. Give on the 26th of every 6th month  . donepezil (ARICEPT) 5 MG tablet Take 5 mg by mouth daily.   . Ferrous Sulfate (IRON)  325 (65 Fe) MG TABS Take 1 tablet by mouth once a day  . hydrocortisone (ANUSOL-HC) 2.5 % rectal cream Apply 1 application topically 2 (two) times daily as needed for hemorrhoids or anal itching.  . labetalol (NORMODYNE) 200 MG tablet Take 200 mg by mouth 2 (two) times daily. For HTN  . NON FORMULARY Diet Type:  Mechanical soft Diet  . Nutritional Supplements (ENSURE ENLIVE PO) Take 1 Bottle by mouth 3 (three) times daily with meals.  . sodium polystyrene (KAYEXALATE) 15 GM/60ML suspension Take 15 g by mouth once. Once a day on Sun,Tue,Thu  . [DISCONTINUED] famotidine (PEPCID) 20 MG tablet Take 20 mg by mouth daily.   No facility-administered encounter medications on file as of 02/15/2019.      SIGNIFICANT DIAGNOSTIC EXAMS   LABS REVIEWED PREVIOUS:   09-15-18: wbc 4.5; hgb 10.5; hct 33.5; mcv 105.0; plt 219; glucose 83; bun 52; creat 1.40 ;k+ 4.9; na++ 138; ca 9.3; liver normal albumin 3.9  tsh 2.495; urine culture: proteus  mirabilis 09-18-18: glucose 87; bun 47; creat 1.39; k+ 4.3; na++ 135; ca 8.8   NO NEW LABS.   Review of Systems  Unable to perform ROS: Dementia (unable to participate )    Physical Exam Constitutional:      General: Morgan [name] is not in acute distress.    Appearance: Morgan [name] is underweight. Morgan [name] is not diaphoretic.  Neck:     Musculoskeletal: Neck supple.     Thyroid: No thyromegaly.  Cardiovascular:     Rate and Rhythm: Normal rate and regular rhythm.     Pulses: Normal pulses.     Heart sounds: Normal heart sounds.  Pulmonary:     Effort: Pulmonary effort is normal. No respiratory distress.     Breath sounds: Normal breath sounds.  Abdominal:     General: Bowel sounds are normal. There is no distension.     Palpations: Abdomen is soft.     Tenderness: There is no abdominal tenderness.  Musculoskeletal:     Right lower leg: No edema.     Left lower leg: No edema.     Comments: Is able to move all extremities   Lymphadenopathy:     Cervical: No cervical adenopathy.  Skin:    General: Skin is warm and dry.  Neurological:     Mental Status: Morgan [name] is alert. Mental status is at baseline.  Psychiatric:        Mood and Affect: Mood normal.      ASSESSMENT/ PLAN:  TODAY:   1. Anemia due to stage 3 chronic kidney disease: is stable will continue hgb 10.5; will continue iron daily   2. Non-intentional weight loss: is stable current weight 102 stable (previous 103) pounds; will continue ensure three times daily   3. Unspecified type dementia without behavioral disturbance; is without change: weight is 102 pounds and stable (previous 103) will continue aricept 5 mg daily    PREVIOUS   4. Stage 3 chronic kidney disease: is without change bun 47; creat 1.39;   5. Chronic hyperkalemia: is stable k+ 4.3; will continue kayexalate 15 gm three times weekly   6. Essential hypertension: is stable b/p 132/69 will continue labetolol 200 mg twice daily is off norvasc  7. GERD without  esophagitis: is stable will continue tums 500 mg three times daily   8. Age related osteoporosis without current pathological fracture is stable will continue prolia 60 mg every 6 months.   Will check cbc; cmp; tsh  MD is aware of resident's narcotic use and is in agreement with current plan of care. We will attempt to wean resident as apropriate   [name] [facility]  Contact [phone] Monday through Friday 8am- 5pm  After hours call [phone]

## 2019-02-19 ENCOUNTER — Non-Acute Institutional Stay (SKILLED_NURSING_FACILITY): Payer: Medicare Other | Admitting: Adult Health

## 2019-02-19 ENCOUNTER — Encounter (HOSPITAL_COMMUNITY)
Admission: AD | Admit: 2019-02-19 | Discharge: 2019-02-19 | Disposition: A | Discharge status: Home / Self Care | Payer: Medicare Other | Source: Ambulatory Visit | Attending: Adult Health | Admitting: Adult Health

## 2019-02-19 ENCOUNTER — Encounter: Payer: Self-pay | Admitting: Adult Health

## 2019-02-19 DIAGNOSIS — N183 Chronic kidney disease, stage 3 (moderate): Secondary | ICD-10-CM | POA: Diagnosis not present

## 2019-02-19 DIAGNOSIS — I129 Hypertensive chronic kidney disease with stage 1 through stage 4 chronic kidney disease, or unspecified chronic kidney disease: Secondary | ICD-10-CM | POA: Diagnosis present

## 2019-02-19 DIAGNOSIS — N179 Acute kidney failure, unspecified: Secondary | ICD-10-CM

## 2019-02-19 DIAGNOSIS — E875 Hyperkalemia: Secondary | ICD-10-CM

## 2019-02-19 LAB — COMPREHENSIVE METABOLIC PANEL
ALT: 14 U/L (ref 0–44)
AST: 20 U/L (ref 15–41)
Albumin: 3.4 g/dL — ABNORMAL LOW (ref 3.5–5.0)
Alkaline Phosphatase: 51 U/L (ref 38–126)
Anion gap: 8 (ref 5–15)
BUN: 88 mg/dL — ABNORMAL HIGH (ref 8–23)
CO2: 27 mmol/L (ref 22–32)
Calcium: 11 mg/dL — ABNORMAL HIGH (ref 8.9–10.3)
Chloride: 103 mmol/L (ref 98–111)
Creatinine, Ser: 1.76 mg/dL — ABNORMAL HIGH (ref 0.44–1.00)
GFR calc Af Amer: 28 mL/min — ABNORMAL LOW (ref >60)
GFR calc non Af Amer: 24 mL/min — ABNORMAL LOW (ref >60)
Glucose, Bld: 91 mg/dL (ref 70–99)
Potassium: 5.4 mmol/L — ABNORMAL HIGH (ref 3.5–5.1)
Sodium: 138 mmol/L (ref 135–145)
Total Bilirubin: 0.8 mg/dL (ref 0.3–1.2)
Total Protein: 6.8 g/dL (ref 6.5–8.1)

## 2019-02-19 LAB — CBC
HCT: 31.6 % — ABNORMAL LOW (ref 36.0–46.0)
Hemoglobin: 10 g/dL — ABNORMAL LOW (ref 12.0–15.0)
MCH: 33.3 pg (ref 26.0–34.0)
MCHC: 31.6 g/dL (ref 30.0–36.0)
MCV: 105.3 fL — ABNORMAL HIGH (ref 80.0–100.0)
Platelets: 177 10*3/uL (ref 150–400)
RBC: 3 MIL/uL — ABNORMAL LOW (ref 3.87–5.11)
RDW: 12.9 % (ref 11.5–15.5)
WBC: 5 10*3/uL (ref 4.0–10.5)
nRBC: 0 % (ref 0.0–0.2)

## 2019-02-19 LAB — TSH: TSH: 2.703 u[IU]/mL (ref 0.350–4.500)

## 2019-02-19 NOTE — Progress Notes (Signed)
Location:   [place] Room Number: [place] of Service:  SNF (31)   CODE STATUS: Full Code  No Known Allergies  Chief Complaint  Patient presents with  . Acute Visit    Lab follow up    HPI:  She had routine labs drawn which demonstrate worsening renal function. I have spoken with laura regarding her status. We have discussed her code status at great length. She wants Canda to be a DNR. She does want all other care to be given. She did verbalize that her status may not improve. She denies any pain; no changes in her appetite. Her weight is stable. She denies any insomnia or anxiety.   Past Medical History:  Diagnosis Date  . Anemia   . Anxiety   . Dementia ([place])   . Dysphagia, oropharyngeal phase   . GERD (gastroesophageal reflux disease)   . Hyperpotassemia   . Hyperpotassemia   . Hypertension   . Other severe protein-calorie malnutrition     Past Surgical History:  Procedure Laterality Date  . ABDOMINAL HYSTERECTOMY    . APPENDECTOMY      Social History   Socioeconomic History  . Marital status: Widowed    Spouse name: Not on file  . Number of children: Not on file  . Years of education: Not on file  . Highest education level: Not on file  Occupational History  . Not on file  Social Needs  . Financial resource strain: Not hard at all  . Food insecurity    Worry: Never true    Inability: Never true  . Transportation needs    Medical: No    Non-medical: No  Tobacco Use  . Smoking status: Never Smoker  . Smokeless tobacco: Never Used  Substance and Sexual Activity  . Alcohol use: No  . Drug use: No  . Sexual activity: Never  Lifestyle  . Physical activity    Days per week: 0 days    Minutes per session: 0 min  . Stress: Not at all  Relationships  . Social Herbalist on phone: Never    Gets together: Once a week    Attends religious service: Never    Active member of club or organization: No    Attends  meetings of clubs or organizations: Never    Relationship status: Widowed  . Intimate partner violence    Fear of current or ex partner: No    Emotionally abused: No    Physically abused: No    Forced sexual activity: No  Other Topics Concern  . Not on file  Social History Narrative  . Not on file   History reviewed. No pertinent family history.    VITAL SIGNS BP 114/65   Pulse 68   Temp 98.3 F (36.8 C)   Resp 18   Ht 5' 5.5" (1.664 m)   Wt 102 lb 3.2 oz (46.4 kg)   SpO2 97%   BMI 16.75 kg/m   Outpatient Encounter Medications as of 02/19/2019  Medication Sig  . calcium carbonate (TUMS - DOSED IN MG ELEMENTAL CALCIUM) 500 MG chewable tablet Chew 1 tablet by mouth 3 (three) times daily with meals.  Marland Kitchen denosumab (PROLIA) 60 MG/ML SOSY injection Inject 60 mg into the skin every 6 (six) months. Give on the 26th of every 6th month  . donepezil (ARICEPT) 5 MG tablet Take 5 mg by mouth daily.   . Ferrous Sulfate (IRON) 325 (  65 Fe) MG TABS Take 1 tablet by mouth once a day  . hydrocortisone (ANUSOL-HC) 2.5 % rectal cream Apply 1 application topically 2 (two) times daily as needed for hemorrhoids or anal itching.  . labetalol (NORMODYNE) 200 MG tablet Take 200 mg by mouth 2 (two) times daily. For HTN  . NON FORMULARY Diet Type:  Mechanical soft Diet  . Nutritional Supplements (ENSURE ENLIVE PO) Take 1 Bottle by mouth 3 (three) times daily with meals.  . sodium polystyrene (KAYEXALATE) 15 GM/60ML suspension Take 15 g by mouth once. Once a day on Sun,Tue,Thu   No facility-administered encounter medications on file as of 02/19/2019.      SIGNIFICANT DIAGNOSTIC EXAMS  LABS REVIEWED PREVIOUS:   09-15-18: wbc 4.5; hgb 10.5; hct 33.5; mcv 105.0; plt 219; glucose 83; bun 52; creat 1.40 ;k+ 4.9; na++ 138; ca 9.3; liver normal albumin 3.9  tsh 2.495; urine culture: proteus mirabilis 09-18-18: glucose 87; bun 47; creat 1.39; k+ 4.3; na++ 135; ca 8.8   TODAY  02-19-19: wbc 5.0; hgb 10.0; hct  31.6; mcv 105.3; plt 177; glucose 91; bun 88; creat 1.76; k+ 5.4; na++ 138; ca 11.0; liver normal albumin 3.4; tsh 2.703   Review of Systems  Unable to perform ROS: Dementia (unable to participate )   Physical Exam Constitutional:      General: She is not in acute distress.    Appearance: She is underweight. She is not diaphoretic.  Neck:     Musculoskeletal: Neck supple.     Thyroid: No thyromegaly.  Cardiovascular:     Rate and Rhythm: Normal rate and regular rhythm.     Pulses: Normal pulses.     Heart sounds: Normal heart sounds.  Pulmonary:     Effort: Pulmonary effort is normal. No respiratory distress.     Breath sounds: Normal breath sounds.  Abdominal:     General: Bowel sounds are normal. There is no distension.     Palpations: Abdomen is soft.     Tenderness: There is no abdominal tenderness.  Musculoskeletal:     Right lower leg: No edema.     Left lower leg: No edema.     Comments: Is able to move all extremities   Lymphadenopathy:     Cervical: No cervical adenopathy.  Skin:    General: Skin is warm and dry.  Neurological:     Mental Status: She is alert. Mental status is at baseline.  Psychiatric:        Mood and Affect: Mood normal.       ASSESSMENT/ PLAN:  TODAY:   1. Acute on chronic stage 3 renal disease 2. Hyperkalemia 3. Hypercalcemia  DNR NS at 75 cc per hour for 1 liter Will stop aricept due to her renal function Will check BMP on 02-22-19  Will monitor her status.    MD is aware of resident's narcotic use and is in agreement with current plan of care. We will attempt to wean resident as apropriate   [name] [facility]  Contact [phone] Monday through Friday 8am- 5pm  After hours call [phone]

## 2019-02-20 ENCOUNTER — Encounter: Payer: Self-pay | Admitting: Adult Health

## 2019-02-20 ENCOUNTER — Non-Acute Institutional Stay (SKILLED_NURSING_FACILITY): Payer: Medicare Other | Admitting: Adult Health

## 2019-02-20 DIAGNOSIS — E875 Hyperkalemia: Secondary | ICD-10-CM | POA: Diagnosis not present

## 2019-02-20 DIAGNOSIS — I129 Hypertensive chronic kidney disease with stage 1 through stage 4 chronic kidney disease, or unspecified chronic kidney disease: Secondary | ICD-10-CM | POA: Diagnosis not present

## 2019-02-20 DIAGNOSIS — N183 Chronic kidney disease, stage 3 unspecified: Secondary | ICD-10-CM

## 2019-02-20 NOTE — Progress Notes (Signed)
Location:   [place] Room Number: [place] of Service:  SNF (31)   CODE STATUS: DNR  No Known Allergies  Chief Complaint  Patient presents with  . Medical Management of Chronic Issues       Stage 3 chronic kidney disease: is worse: Chronic hyperkalemia:Benign hypertension with chronic kidney disease stage 3:     HPI:  She is a 85 year old long term resident of this facility being seen for the management of her chronic illnesses: stage 3 chronic kidney disease; hypertension; hyperkalemia. Her blood work did demonstrate worsening renal function; after speaking with her family; we attempted one liter of saline. She pulled her iv out last night; stating "I dont want this". Fluids are being pushed. There are no reports of uncontrolled pain; no changes in her appetite. No reports of agitation or anxiety.    Past Medical History:  Diagnosis Date  . Anemia   . Anxiety   . Dementia ([place])   . Dysphagia, oropharyngeal phase   . GERD (gastroesophageal reflux disease)   . Hyperpotassemia   . Hyperpotassemia   . Hypertension   . Other severe protein-calorie malnutrition     Past Surgical History:  Procedure Laterality Date  . ABDOMINAL HYSTERECTOMY    . APPENDECTOMY      Social History   Socioeconomic History  . Marital status: Widowed    Spouse name: Not on file  . Number of children: Not on file  . Years of education: Not on file  . Highest education level: Not on file  Occupational History  . Not on file  Social Needs  . Financial resource strain: Not hard at all  . Food insecurity    Worry: Never true    Inability: Never true  . Transportation needs    Medical: No    Non-medical: No  Tobacco Use  . Smoking status: Never Smoker  . Smokeless tobacco: Never Used  Substance and Sexual Activity  . Alcohol use: No  . Drug use: No  . Sexual activity: Never  Lifestyle  . Physical activity    Days per week: 0 days    Minutes per  session: 0 min  . Stress: Not at all  Relationships  . Social Herbalist on phone: Never    Gets together: Once a week    Attends religious service: Never    Active member of club or organization: No    Attends meetings of clubs or organizations: Never    Relationship status: Widowed  . Intimate partner violence    Fear of current or ex partner: No    Emotionally abused: No    Physically abused: No    Forced sexual activity: No  Other Topics Concern  . Not on file  Social History Narrative  . Not on file   History reviewed. No pertinent family history.    VITAL SIGNS BP 114/65   Pulse 68   Temp 98.3 F (36.8 C)   Resp 18   Ht 5' 5.5" (1.664 m)   Wt 102 lb 3.2 oz (46.4 kg)   SpO2 97%   BMI 16.75 kg/m   Outpatient Encounter Medications as of 02/20/2019  Medication Sig  . calcium carbonate (TUMS - DOSED IN MG ELEMENTAL CALCIUM) 500 MG chewable tablet Chew 1 tablet by mouth 3 (three) times daily with meals.  Marland Kitchen denosumab (PROLIA) 60 MG/ML SOSY injection Inject 60 mg into the skin every 6 (  six) months. Give on the 26th of every 6th month  . Ferrous Sulfate (IRON) 325 (65 Fe) MG TABS Take 1 tablet by mouth once a day  . hydrocortisone (ANUSOL-HC) 2.5 % rectal cream Apply 1 application topically 2 (two) times daily as needed for hemorrhoids or anal itching.  . labetalol (NORMODYNE) 200 MG tablet Take 200 mg by mouth 2 (two) times daily. For HTN  . NON FORMULARY Diet Type:  Mechanical soft Diet  . Nutritional Supplements (ENSURE ENLIVE PO) Take 1 Bottle by mouth 3 (three) times daily with meals.  . sodium polystyrene (KAYEXALATE) 15 GM/60ML suspension Take 15 g by mouth once. Once a day on Sun,Tue,Thu  . [DISCONTINUED] donepezil (ARICEPT) 5 MG tablet Take 5 mg by mouth daily.    No facility-administered encounter medications on file as of 02/20/2019.      SIGNIFICANT DIAGNOSTIC EXAMS  LABS REVIEWED PREVIOUS:   09-15-18: wbc 4.5; hgb 10.5; hct 33.5; mcv 105.0; plt  219; glucose 83; bun 52; creat 1.40 ;k+ 4.9; na++ 138; ca 9.3; liver normal albumin 3.9  tsh 2.495; urine culture: proteus mirabilis 09-18-18: glucose 87; bun 47; creat 1.39; k+ 4.3; na++ 135; ca 8.8  02-19-19: wbc 5.0; hgb 10.0; hct 31.6; mcv 105.3; plt 177; glucose 91; bun 88; creat 1.76; k+ 5.4; na++ 138; ca 11.0; liver normal albumin 3.4; tsh 2.703  NO NEW LABS    Review of Systems  Unable to perform ROS: Dementia (unable to participate )      Physical Exam Constitutional:      General: She is not in acute distress.    Appearance: She is well-developed. She is not diaphoretic.  Neck:     Musculoskeletal: Neck supple.     Thyroid: No thyromegaly.  Cardiovascular:     Rate and Rhythm: Normal rate and regular rhythm.     Pulses: Normal pulses.     Heart sounds: Normal heart sounds.  Pulmonary:     Effort: Pulmonary effort is normal. No respiratory distress.     Breath sounds: Normal breath sounds.  Abdominal:     General: Bowel sounds are normal. There is no distension.     Palpations: Abdomen is soft.     Tenderness: There is no abdominal tenderness.  Musculoskeletal:     Right lower leg: No edema.     Left lower leg: No edema.     Comments: Is able to move all extremities   Lymphadenopathy:     Cervical: No cervical adenopathy.  Skin:    General: Skin is warm and dry.  Neurological:     Mental Status: She is alert. Mental status is at baseline.  Psychiatric:        Mood and Affect: Mood normal.     ASSESSMENT/ PLAN:   TODAY:   1. Stage 3 chronic kidney disease: is worse: bun 88; creat 1.76; staff is pushing fluids; she pulled out her IV site; is due for follow up labs.   2. Chronic hyperkalemia: is without change: k+ 5.4; will continue kayexalate 15 gm three times weekly   3. Benign hypertension with chronic kidney disease stage 3: is stable b/p 114/65 will continue labetolol 200 mg twice daily the norvasc has been stopped.   PREVIOUS   4. GERD without  esophagitis: is stable will continue tums 500 mg three times daily   5. Age related osteoporosis without current pathological fracture is stable will continue prolia 60 mg every 6 months.   6. Anemia due to  stage 3 chronic kidney disease: is stable will continue hgb 10.0; will continue iron daily   7. Non-intentional weight loss: is stable current weight 102 stable (previous 103) pounds; will continue ensure three times daily   8. Unspecified type dementia without behavioral disturbance; is without change: weight is 102 pounds and stable (previous 103) aricept stopped due to worsening renal failure   Due for bmp 02-22-19.     MD is aware of resident's narcotic use and is in agreement with current plan of care. We will attempt to wean resident as apropriate   [name] [facility]  Contact [phone] Monday through Friday 8am- 5pm  After hours call [phone]

## 2019-02-22 ENCOUNTER — Non-Acute Institutional Stay (SKILLED_NURSING_FACILITY): Payer: Medicare Other | Admitting: Adult Health

## 2019-02-22 ENCOUNTER — Other Ambulatory Visit (HOSPITAL_COMMUNITY)
Admission: RE | Admit: 2019-02-22 | Discharge: 2019-02-22 | Disposition: A | Discharge status: Home / Self Care | Payer: Medicare Other | Source: Ambulatory Visit | Attending: Adult Health | Admitting: Adult Health

## 2019-02-22 ENCOUNTER — Encounter: Payer: Self-pay | Admitting: Adult Health

## 2019-02-22 DIAGNOSIS — M810 Age-related osteoporosis without current pathological fracture: Secondary | ICD-10-CM

## 2019-02-22 DIAGNOSIS — I129 Hypertensive chronic kidney disease with stage 1 through stage 4 chronic kidney disease, or unspecified chronic kidney disease: Secondary | ICD-10-CM | POA: Insufficient documentation

## 2019-02-22 DIAGNOSIS — N184 Chronic kidney disease, stage 4 (severe): Secondary | ICD-10-CM

## 2019-02-22 DIAGNOSIS — F0390 Unspecified dementia without behavioral disturbance: Secondary | ICD-10-CM

## 2019-02-22 LAB — BASIC METABOLIC PANEL
Anion gap: 10 (ref 5–15)
BUN: 81 mg/dL — ABNORMAL HIGH (ref 8–23)
CO2: 28 mmol/L (ref 22–32)
Calcium: 10.6 mg/dL — ABNORMAL HIGH (ref 8.9–10.3)
Chloride: 101 mmol/L (ref 98–111)
Creatinine, Ser: 1.57 mg/dL — ABNORMAL HIGH (ref 0.44–1.00)
GFR calc Af Amer: 32 mL/min — ABNORMAL LOW (ref >60)
GFR calc non Af Amer: 28 mL/min — ABNORMAL LOW (ref >60)
Glucose, Bld: 88 mg/dL (ref 70–99)
Potassium: 4.8 mmol/L (ref 3.5–5.1)
Sodium: 139 mmol/L (ref 135–145)

## 2019-02-22 NOTE — Progress Notes (Signed)
Location:   [place] Room Number: [place] of Service:  SNF (31)   CODE STATUS: DNR  No Known Allergies  Chief Complaint  Patient presents with  . Acute Visit    Lab Follow up    HPI:  Her lab work has not improved over the past couple of days. She had pulled her IV out; the staff has been pushing fluids since that time. I have spoken with her niece; regarding her overall status; and prognosis. She has verbalized understanding. She does not want aggressive care done. She does not want a feeding tube placed. She does not want her sent to the hospital except for fractures. She would like to focus upon comfort.    Past Medical History:  Diagnosis Date  . Anemia   . Anxiety   . Dementia ([place])   . Dysphagia, oropharyngeal phase   . GERD (gastroesophageal reflux disease)   . Hyperpotassemia   . Hyperpotassemia   . Hypertension   . Other severe protein-calorie malnutrition     Past Surgical History:  Procedure Laterality Date  . ABDOMINAL HYSTERECTOMY    . APPENDECTOMY      Social History   Socioeconomic History  . Marital status: Widowed    Spouse name: Not on file  . Number of children: Not on file  . Years of education: Not on file  . Highest education level: Not on file  Occupational History  . Not on file  Social Needs  . Financial resource strain: Not hard at all  . Food insecurity    Worry: Never true    Inability: Never true  . Transportation needs    Medical: No    Non-medical: No  Tobacco Use  . Smoking status: Never Smoker  . Smokeless tobacco: Never Used  Substance and Sexual Activity  . Alcohol use: No  . Drug use: No  . Sexual activity: Never  Lifestyle  . Physical activity    Days per week: 0 days    Minutes per session: 0 min  . Stress: Not at all  Relationships  . Social Herbalist on phone: Never    Gets together: Once a week    Attends religious service: Never    Active member of club or  organization: No    Attends meetings of clubs or organizations: Never    Relationship status: Widowed  . Intimate partner violence    Fear of current or ex partner: No    Emotionally abused: No    Physically abused: No    Forced sexual activity: No  Other Topics Concern  . Not on file  Social History Narrative  . Not on file   History reviewed. No pertinent family history.    VITAL SIGNS BP 114/65   Pulse 68   Temp (!) 96.6 F (35.9 C)   Resp 18   Ht 5' 5.5" (1.664 m)   Wt 102 lb 3.2 oz (46.4 kg)   SpO2 97%   BMI 16.75 kg/m   Outpatient Encounter Medications as of 02/22/2019  Medication Sig  . calcium carbonate (TUMS - DOSED IN MG ELEMENTAL CALCIUM) 500 MG chewable tablet Chew 1 tablet by mouth 3 (three) times daily with meals.  Marland Kitchen denosumab (PROLIA) 60 MG/ML SOSY injection Inject 60 mg into the skin every 6 (six) months. Give on the 26th of every 6th month  . Ferrous Sulfate (IRON) 325 (65 Fe) MG TABS Take 1 tablet  by mouth once a day  . hydrocortisone (ANUSOL-HC) 2.5 % rectal cream Apply 1 application topically 2 (two) times daily as needed for hemorrhoids or anal itching.  . labetalol (NORMODYNE) 200 MG tablet Take 200 mg by mouth 2 (two) times daily. For HTN  . NON FORMULARY Diet Type:  Mechanical soft Diet  . Nutritional Supplements (ENSURE ENLIVE PO) Take 1 Bottle by mouth 3 (three) times daily with meals.  . sodium polystyrene (KAYEXALATE) 15 GM/60ML suspension Take 15 g by mouth once. Once a day on Sun,Tue,Thu   No facility-administered encounter medications on file as of 02/22/2019.      SIGNIFICANT DIAGNOSTIC EXAMS  LABS REVIEWED PREVIOUS:   09-15-18: wbc 4.5; hgb 10.5; hct 33.5; mcv 105.0; plt 219; glucose 83; bun 52; creat 1.40 ;k+ 4.9; na++ 138; ca 9.3; liver normal albumin 3.9  tsh 2.495; urine culture: proteus mirabilis 09-18-18: glucose 87; bun 47; creat 1.39; k+ 4.3; na++ 135; ca 8.8  02-19-19: wbc 5.0; hgb 10.0; hct 31.6; mcv 105.3; plt 177; glucose 91; bun  88; creat 1.76; k+ 5.4; na++ 138; ca 11.0; liver normal albumin 3.4; tsh 2.703  TODAY  02-22-19: glucose 88; bun 81; creat 1.57; k+ 4.8; na++ 139 ca 10.6  Review of Systems  Unable to perform ROS: Dementia (unable to participate )    Physical Exam Constitutional:      General: She is not in acute distress.    Appearance: She is well-developed. She is not diaphoretic.  Neck:     Musculoskeletal: Neck supple.     Thyroid: No thyromegaly.  Cardiovascular:     Rate and Rhythm: Normal rate and regular rhythm.     Pulses: Normal pulses.     Heart sounds: Normal heart sounds.  Pulmonary:     Effort: Pulmonary effort is normal. No respiratory distress.     Breath sounds: Normal breath sounds.  Abdominal:     General: Bowel sounds are normal. There is no distension.     Palpations: Abdomen is soft.     Tenderness: There is no abdominal tenderness.  Musculoskeletal:     Right lower leg: No edema.     Left lower leg: No edema.     Comments: Is able to move all extremities   Lymphadenopathy:     Cervical: No cervical adenopathy.  Skin:    General: Skin is warm and dry.  Neurological:     Mental Status: She is alert. Mental status is at baseline.  Psychiatric:        Mood and Affect: Mood normal.       ASSESSMENT/ PLAN:   TODAY:   1. Chronic renal disease stage 4 severely decreased glomerular filtration rate between 15-29.   2. Age related osteoporosis  3.Unspecified type dementia without behavioral disturbance;   No hospitalization except for fracture No feeding tube Focus upon comfort care.      MD is aware of resident's narcotic use and is in agreement with current plan of care. We will attempt to wean resident as apropriate   [name] [facility]  Contact [phone] Monday through Friday 8am- 5pm  After hours call [phone]

## 2019-03-28 ENCOUNTER — Non-Acute Institutional Stay (SKILLED_NURSING_FACILITY): Payer: Medicare Other | Admitting: Adult Health

## 2019-03-28 ENCOUNTER — Encounter: Payer: Self-pay | Admitting: Adult Health

## 2019-03-28 DIAGNOSIS — M810 Age-related osteoporosis without current pathological fracture: Secondary | ICD-10-CM | POA: Diagnosis not present

## 2019-03-28 DIAGNOSIS — D631 Anemia in chronic kidney disease: Secondary | ICD-10-CM | POA: Diagnosis not present

## 2019-03-28 DIAGNOSIS — N184 Chronic kidney disease, stage 4 (severe): Secondary | ICD-10-CM | POA: Diagnosis not present

## 2019-03-28 DIAGNOSIS — K219 Gastro-esophageal reflux disease without esophagitis: Secondary | ICD-10-CM

## 2019-03-28 NOTE — Progress Notes (Signed)
Location:   [place] Room Number: [place] of Service:  SNF (31)   CODE STATUS: DNR  No Known Allergies  Chief Complaint  Patient presents with  . Medical Management of Chronic Issues        GERD without esophagitis: Age related osteoporosis without current pathological fracture: Anemia due to stage 4 chronic kidney disease:     HPI:  She is a 85 year old long term resident of this facility being seen for the management of her chronic illnesses; gerd; osteoporosis; anemia. There are no reports of uncontrolled pain; no reports of agitation or anxiety. There are no reports of heart burn.   Past Medical History:  Diagnosis Date  . Anemia   . Anxiety   . Dementia ([place])   . Dysphagia, oropharyngeal phase   . GERD (gastroesophageal reflux disease)   . Hyperpotassemia   . Hyperpotassemia   . Hypertension   . Other severe protein-calorie malnutrition     Past Surgical History:  Procedure Laterality Date  . ABDOMINAL HYSTERECTOMY    . APPENDECTOMY      Social History   Socioeconomic History  . Marital status: Widowed    Spouse name: Not on file  . Number of children: Not on file  . Years of education: Not on file  . Highest education level: Not on file  Occupational History  . Not on file  Social Needs  . Financial resource strain: Not hard at all  . Food insecurity    Worry: Never true    Inability: Never true  . Transportation needs    Medical: No    Non-medical: No  Tobacco Use  . Smoking status: Never Smoker  . Smokeless tobacco: Never Used  Substance and Sexual Activity  . Alcohol use: No  . Drug use: No  . Sexual activity: Never  Lifestyle  . Physical activity    Days per week: 0 days    Minutes per session: 0 min  . Stress: Not at all  Relationships  . Social Herbalist on phone: Never    Gets together: Once a week    Attends religious service: Never    Active member of club or organization: No   Attends meetings of clubs or organizations: Never    Relationship status: Widowed  . Intimate partner violence    Fear of current or ex partner: No    Emotionally abused: No    Physically abused: No    Forced sexual activity: No  Other Topics Concern  . Not on file  Social History Narrative  . Not on file   History reviewed. No pertinent family history.    VITAL SIGNS BP 114/71   Pulse 74   Temp 97.9 F (36.6 C)   Resp 17   Ht 5' 5.5" (1.664 m)   Wt 100 lb 6.4 oz (45.5 kg)   BMI 16.45 kg/m   Outpatient Encounter Medications as of 03/28/2019  Medication Sig  . denosumab (PROLIA) 60 MG/ML SOSY injection Inject 60 mg into the skin every 6 (six) months. Give on the 26th of every 6th month  . Ferrous Sulfate (IRON) 325 (65 Fe) MG TABS Take 1 tablet by mouth once a day  . hydrocortisone (ANUSOL-HC) 2.5 % rectal cream Apply 1 application topically 2 (two) times daily as needed for hemorrhoids or anal itching.  . labetalol (NORMODYNE) 200 MG tablet Take 200 mg by mouth 2 (two) times daily.  For HTN  . NON FORMULARY Diet Type:  Mechanical soft Diet  . Nutritional Supplements (ENSURE ENLIVE PO) Take 1 Bottle by mouth 3 (three) times daily with meals.  . sodium polystyrene (KAYEXALATE) 15 GM/60ML suspension Take 15 g by mouth once. Once a day on Sun,Tue,Thu  . [DISCONTINUED] calcium carbonate (TUMS - DOSED IN MG ELEMENTAL CALCIUM) 500 MG chewable tablet Chew 1 tablet by mouth 3 (three) times daily with meals.   No facility-administered encounter medications on file as of 03/28/2019.      SIGNIFICANT DIAGNOSTIC EXAMS  LABS REVIEWED PREVIOUS:   09-15-18: wbc 4.5; hgb 10.5; hct 33.5; mcv 105.0; plt 219; glucose 83; bun 52; creat 1.40 ;k+ 4.9; na++ 138; ca 9.3; liver normal albumin 3.9  tsh 2.495; urine culture: proteus mirabilis 09-18-18: glucose 87; bun 47; creat 1.39; k+ 4.3; na++ 135; ca 8.8  02-19-19: wbc 5.0; hgb 10.0; hct 31.6; mcv 105.3; plt 177; glucose 91; bun 88; creat 1.76; k+  5.4; na++ 138; ca 11.0; liver normal albumin 3.4; tsh 2.703 02-22-19: glucose 88; bun 81; creat 1.57; k+ 4.8; na++ 139 ca 10.6  NO NEW LABS.    Review of Systems  Unable to perform ROS: Dementia (unable to participate )     Physical Exam Constitutional:      General: She is not in acute distress.    Appearance: She is well-developed. She is not diaphoretic.     Comments: thin  Neck:     Thyroid: No thyromegaly.  Cardiovascular:     Rate and Rhythm: Normal rate and regular rhythm.     Heart sounds: Normal heart sounds.  Pulmonary:     Effort: Pulmonary effort is normal. No respiratory distress.     Breath sounds: Normal breath sounds.  Abdominal:     General: Bowel sounds are normal. There is no distension.     Palpations: Abdomen is soft.     Tenderness: There is no abdominal tenderness.  Musculoskeletal:     Right lower leg: No edema.     Left lower leg: No edema.     Comments: Is able to move all extremities   Lymphadenopathy:     Cervical: No cervical adenopathy.  Skin:    General: Skin is warm and dry.  Neurological:     Mental Status: She is alert. Mental status is at baseline.  Psychiatric:        Mood and Affect: Mood normal.        ASSESSMENT/ PLAN:  TODAY:   1. GERD without esophagitis: is stable will continue tums 500 mg three times daily   2. Age related osteoporosis without current pathological fracture: is stable will continue prolia 60 mg every 6 months  3. Anemia due to stage 4 chronic kidney disease: is stable hgb 10.0 will continue iron daily    PREVIOUS   4. Non-intentional weight loss: is stable current weight 100 stable (previous 102, 103) pounds; will continue ensure three times daily   5. Unspecified type dementia without behavioral disturbance; is without change: weight is 100 pounds and stable (previous 102, 103) aricept stopped due to worsening renal failure   6. Stage 4 chronic kidney disease: is without change : bun 81; creat  1.57;  Will continue to monitor   7. Chronic hyperkalemia: is without change: k+ 5.4; will continue kayexalate 15 gm three times weekly   8. Benign hypertension with chronic kidney disease stage 3: is stable b/p 114/71 will continue labetolol 200 mg twice daily  the norvasc has been stopped.      MD is aware of resident's narcotic use and is in agreement with current plan of care. We will attempt to wean resident as apropriate   [name] [facility]  Contact [phone] Monday through Friday 8am- 5pm  After hours call [phone]

## 2019-03-29 DIAGNOSIS — D631 Anemia in chronic kidney disease: Secondary | ICD-10-CM | POA: Insufficient documentation

## 2019-04-11 ENCOUNTER — Non-Acute Institutional Stay (SKILLED_NURSING_FACILITY): Payer: Medicare Other | Admitting: Adult Health

## 2019-04-11 ENCOUNTER — Encounter: Payer: Self-pay | Admitting: Adult Health

## 2019-04-11 DIAGNOSIS — D631 Anemia in chronic kidney disease: Secondary | ICD-10-CM

## 2019-04-11 DIAGNOSIS — N184 Chronic kidney disease, stage 4 (severe): Secondary | ICD-10-CM

## 2019-04-11 DIAGNOSIS — F0390 Unspecified dementia without behavioral disturbance: Secondary | ICD-10-CM

## 2019-04-11 NOTE — Progress Notes (Signed)
Location:    [place] Room Number: 109/W Place of Service:  SNF (31)   CODE STATUS: DNR  No Known Allergies  Chief Complaint  Patient presents with  . Acute Visit    Care Plan Meeting    HPI:  We have come together for her care plan meeting. BIMS 3/15 mood 4/30. Her weight has been stable at 100 pounds. She will decline to drink ensure at times. There are no report of changes in her appetite. There are no reports of falls. She denies any uncontrolled pain. She does get out of bed daily   Past Medical History:  Diagnosis Date  . Anemia   . Anxiety   . Dementia ([place])   . Dysphagia, oropharyngeal phase   . GERD (gastroesophageal reflux disease)   . Hyperpotassemia   . Hyperpotassemia   . Hypertension   . Other severe protein-calorie malnutrition     Past Surgical History:  Procedure Laterality Date  . ABDOMINAL HYSTERECTOMY    . APPENDECTOMY      Social History   Socioeconomic History  . Marital status: Widowed    Spouse name: Not on file  . Number of children: Not on file  . Years of education: Not on file  . Highest education level: Not on file  Occupational History  . Not on file  Social Needs  . Financial resource strain: Not hard at all  . Food insecurity    Worry: Never true    Inability: Never true  . Transportation needs    Medical: No    Non-medical: No  Tobacco Use  . Smoking status: Never Smoker  . Smokeless tobacco: Never Used  Substance and Sexual Activity  . Alcohol use: No  . Drug use: No  . Sexual activity: Never  Lifestyle  . Physical activity    Days per week: 0 days    Minutes per session: 0 min  . Stress: Not at all  Relationships  . Social Herbalist on phone: Never    Gets together: Once a week    Attends religious service: Never    Active member of club or organization: No    Attends meetings of clubs or organizations: Never    Relationship status: Widowed  . Intimate partner violence     Fear of current or ex partner: No    Emotionally abused: No    Physically abused: No    Forced sexual activity: No  Other Topics Concern  . Not on file  Social History Narrative  . Not on file   History reviewed. No pertinent family history.    VITAL SIGNS BP 129/83   Pulse 66   Temp (!) 97 F (36.1 C) (Oral)   Resp 17   Ht 5' 5.5" (1.664 m)   Wt 100 lb 6.4 oz (45.5 kg)   SpO2 97%   BMI 16.45 kg/m   Outpatient Encounter Medications as of 04/11/2019  Medication Sig  . denosumab (PROLIA) 60 MG/ML SOSY injection Inject 60 mg into the skin every 6 (six) months. Give on the 26th of every 6th month  . Ferrous Sulfate (IRON) 325 (65 Fe) MG TABS Take 1 tablet by mouth once a day  . hydrocortisone (ANUSOL-HC) 2.5 % rectal cream Apply 1 application topically 2 (two) times daily as needed for hemorrhoids or anal itching.  . labetalol (NORMODYNE) 200 MG tablet Take 200 mg by mouth 2 (two) times daily. For HTN  .  NON FORMULARY Diet Type:  Mechanical soft Diet  . Nutritional Supplements (ENSURE ENLIVE PO) Take 1 Bottle by mouth 3 (three) times daily with meals.  . sodium polystyrene (KAYEXALATE) 15 GM/60ML suspension Take 15 g by mouth once. Once a day on Sun,Tue,Thu   No facility-administered encounter medications on file as of 04/11/2019.      SIGNIFICANT DIAGNOSTIC EXAMS   LABS REVIEWED PREVIOUS:   09-15-18: wbc 4.5; hgb 10.5; hct 33.5; mcv 105.0; plt 219; glucose 83; bun 52; creat 1.40 ;k+ 4.9; na++ 138; ca 9.3; liver normal albumin 3.9  tsh 2.495; urine culture: proteus mirabilis 09-18-18: glucose 87; bun 47; creat 1.39; k+ 4.3; na++ 135; ca 8.8  02-19-19: wbc 5.0; hgb 10.0; hct 31.6; mcv 105.3; plt 177; glucose 91; bun 88; creat 1.76; k+ 5.4; na++ 138; ca 11.0; liver normal albumin 3.4; tsh 2.703 02-22-19: glucose 88; bun 81; creat 1.57; k+ 4.8; na++ 139 ca 10.6  NO NEW LABS.   Review of Systems  Unable to perform ROS: Dementia (unable to participate )    Physical Exam  Constitutional:      General: She is not in acute distress.    Appearance: She is well-developed. She is not diaphoretic.     Comments: thin  Neck:     Musculoskeletal: Neck supple.     Thyroid: No thyromegaly.  Cardiovascular:     Rate and Rhythm: Normal rate and regular rhythm.     Pulses: Normal pulses.     Heart sounds: Normal heart sounds.  Pulmonary:     Effort: Pulmonary effort is normal. No respiratory distress.     Breath sounds: Normal breath sounds.  Abdominal:     General: Bowel sounds are normal. There is no distension.     Palpations: Abdomen is soft.     Tenderness: There is no abdominal tenderness.  Musculoskeletal:     Right lower leg: No edema.     Left lower leg: No edema.     Comments: Able to move all extremities   Lymphadenopathy:     Cervical: No cervical adenopathy.  Skin:    General: Skin is warm and dry.  Neurological:     Mental Status: She is alert. Mental status is at baseline.  Psychiatric:        Mood and Affect: Mood normal.     ASSESSMENT/ PLAN:  TODAY:   1.  Dementia without behavioral disturbance unspecified dementia type 2. Chronic renal disease stage 4 severely decreased glomerular rate 3. Anemia due to stage 2 chronic kidney disease.   Will continue current medications Will continue current plan of care Will continue to monitor her status.   MD is aware of resident's narcotic use and is in agreement with current plan of care. We will attempt to wean resident as apropriate   [name] [facility]  Contact [phone] Monday through Friday 8am- 5pm  After hours call [phone]

## 2019-04-30 ENCOUNTER — Encounter: Payer: Self-pay | Admitting: Adult Health

## 2019-04-30 ENCOUNTER — Non-Acute Institutional Stay (SKILLED_NURSING_FACILITY): Payer: Medicare Other | Admitting: Adult Health

## 2019-04-30 DIAGNOSIS — D631 Anemia in chronic kidney disease: Secondary | ICD-10-CM

## 2019-04-30 DIAGNOSIS — K219 Gastro-esophageal reflux disease without esophagitis: Secondary | ICD-10-CM

## 2019-04-30 DIAGNOSIS — E875 Hyperkalemia: Secondary | ICD-10-CM

## 2019-04-30 DIAGNOSIS — N184 Chronic kidney disease, stage 4 (severe): Secondary | ICD-10-CM

## 2019-04-30 DIAGNOSIS — R634 Abnormal weight loss: Secondary | ICD-10-CM

## 2019-04-30 DIAGNOSIS — I129 Hypertensive chronic kidney disease with stage 1 through stage 4 chronic kidney disease, or unspecified chronic kidney disease: Secondary | ICD-10-CM | POA: Diagnosis not present

## 2019-04-30 DIAGNOSIS — M810 Age-related osteoporosis without current pathological fracture: Secondary | ICD-10-CM

## 2019-04-30 DIAGNOSIS — N183 Chronic kidney disease, stage 3 unspecified: Secondary | ICD-10-CM

## 2019-04-30 DIAGNOSIS — F0390 Unspecified dementia without behavioral disturbance: Secondary | ICD-10-CM | POA: Diagnosis not present

## 2019-04-30 NOTE — Progress Notes (Signed)
Location:    [place] Room Number: 109/W Place of Service:  SNF (31)   CODE STATUS: DNR  No Known Allergies  Chief Complaint  Patient presents with  . Annual Exam     HPI:  She is a 85 year old long term resident of this facility being seen for her annual comprehensive exam. She has had one fall over the past year without injury. Her weight has been stable overall. She has not had any hospitalizations. There are no reports of uncontrolled pain; no changes in her appetite; no reports of agitation or anxiety. She continues to be followed for her chronic illnesses including: dementia; hypertension; hyperkalemia   Past Medical History:  Diagnosis Date  . Anemia   . Anxiety   . Dementia ([place])   . Dysphagia, oropharyngeal phase   . GERD (gastroesophageal reflux disease)   . Hyperpotassemia   . Hyperpotassemia   . Hypertension   . Other severe protein-calorie malnutrition     Past Surgical History:  Procedure Laterality Date  . ABDOMINAL HYSTERECTOMY    . APPENDECTOMY      Social History   Socioeconomic History  . Marital status: Widowed    Spouse name: Not on file  . Number of children: Not on file  . Years of education: Not on file  . Highest education level: Not on file  Occupational History  . Not on file  Social Needs  . Financial resource strain: Not hard at all  . Food insecurity    Worry: Never true    Inability: Never true  . Transportation needs    Medical: No    Non-medical: No  Tobacco Use  . Smoking status: Never Smoker  . Smokeless tobacco: Never Used  Substance and Sexual Activity  . Alcohol use: No  . Drug use: No  . Sexual activity: Never  Lifestyle  . Physical activity    Days per week: 0 days    Minutes per session: 0 min  . Stress: Not at all  Relationships  . Social Herbalist on phone: Never    Gets together: Once a week    Attends religious service: Never    Active member of club or  organization: No    Attends meetings of clubs or organizations: Never    Relationship status: Widowed  . Intimate partner violence    Fear of current or ex partner: No    Emotionally abused: No    Physically abused: No    Forced sexual activity: No  Other Topics Concern  . Not on file  Social History Narrative  . Not on file   No family history on file.    VITAL SIGNS BP 99/60   Pulse 67   Temp 98.7 F (37.1 C) (Oral)   Resp 16   Ht 5' 5.5" (1.664 m)   Wt 104 lb (47.2 kg)   SpO2 97%   BMI 17.04 kg/m   Outpatient Encounter Medications as of 04/30/2019  Medication Sig  . denosumab (PROLIA) 60 MG/ML SOSY injection Inject 60 mg into the skin every 6 (six) months. Give on the 26th of every 6th month  . Ferrous Sulfate (IRON) 325 (65 Fe) MG TABS Take 1 tablet by mouth once a day  . hydrocortisone (ANUSOL-HC) 2.5 % rectal cream Apply 1 application topically 2 (two) times daily as needed for hemorrhoids or anal itching.  . labetalol (NORMODYNE) 200 MG tablet Take 200 mg by mouth  2 (two) times daily. For HTN  . NON FORMULARY Diet Type:  Mechanical soft Diet  . Nutritional Supplements (ENSURE ENLIVE PO) Take 1 Bottle by mouth 3 (three) times daily with meals.  . sodium polystyrene (KAYEXALATE) 15 GM/60ML suspension Take 15 g by mouth once. Once a day on Sun,Tue,Thu   No facility-administered encounter medications on file as of 04/30/2019.      SIGNIFICANT DIAGNOSTIC EXAMS   LABS REVIEWED PREVIOUS:   09-15-18: wbc 4.5; hgb 10.5; hct 33.5; mcv 105.0; plt 219; glucose 83; bun 52; creat 1.40 ;k+ 4.9; na++ 138; ca 9.3; liver normal albumin 3.9  tsh 2.495; urine culture: proteus mirabilis 09-18-18: glucose 87; bun 47; creat 1.39; k+ 4.3; na++ 135; ca 8.8  02-19-19: wbc 5.0; hgb 10.0; hct 31.6; mcv 105.3; plt 177; glucose 91; bun 88; creat 1.76; k+ 5.4; na++ 138; ca 11.0; liver normal albumin 3.4; tsh 2.703 02-22-19: glucose 88; bun 81; creat 1.57; k+ 4.8; na++ 139 ca 10.6  NO NEW LABS.    Review of Systems  Unable to perform ROS: Dementia (unable to participate )    Physical Exam Constitutional:      General: She is not in acute distress.    Appearance: She is well-developed. She is not diaphoretic.     Comments: thin  HENT:     Right Ear: External ear normal.     Left Ear: External ear normal.     Nose: Nose normal.     Mouth/Throat:     Mouth: Mucous membranes are moist.     Pharynx: Oropharynx is clear.  Neck:     Musculoskeletal: Neck supple.     Thyroid: No thyromegaly.  Cardiovascular:     Rate and Rhythm: Normal rate and regular rhythm.     Pulses: Normal pulses.     Heart sounds: Normal heart sounds.  Pulmonary:     Effort: Pulmonary effort is normal. No respiratory distress.     Breath sounds: Normal breath sounds.  Abdominal:     General: Bowel sounds are normal. There is no distension.     Palpations: Abdomen is soft.     Tenderness: There is no abdominal tenderness.  Musculoskeletal:     Right lower leg: No edema.     Left lower leg: No edema.     Comments: Is able to move all extremities   Lymphadenopathy:     Cervical: No cervical adenopathy.  Skin:    General: Skin is warm and dry.  Neurological:     Mental Status: She is alert. Mental status is at baseline.  Psychiatric:        Mood and Affect: Mood normal.     ASSESSMENT/ PLAN:  TODAY:   1. Non-intentional weight loss: is stable current weight is 104 and stable (previous 102, 103) pounds; will continue ensure three times daily   2. Unspecified type dementia without behavioral disturbance; is without change weight is 104 pounds is stable aricept was stopped due to renal failure  3. Stage 4 chronic kidney disease: is without change: bun 81; creat 1.57 will monitor   4. Chronic hyperkalemia: is stable k+ 5.4 will continue kayexalate 15 gm three times weekly   5. Benign hyertension with chronic kidney disease stage 3: is stable b/p 99/60 will continue labetolol 200 mg twice daily  and will monitor her status is off norvasc  6. GERD without esophagitis: is stable will continue tums 500 mg three times daily  7. Age related osteoporosis without  current pathological fracture: is stable will continue prolia every 6 months  8. Anemia due to stage 4 chronic kidney disease: is stable hgb 10.0 will continue iron daily   Her health maintenance is up to date.    MD is aware of resident's narcotic use and is in agreement with current plan of care. We will attempt to wean resident as appropriate.  [name] [facility]  Contact [phone] Monday through Friday 8am- 5pm  After hours call [phone]

## 2019-05-28 ENCOUNTER — Other Ambulatory Visit: Payer: Self-pay | Admitting: Adult Health

## 2019-05-30 ENCOUNTER — Non-Acute Institutional Stay (SKILLED_NURSING_FACILITY): Payer: Medicare Other | Admitting: Internal Medicine

## 2019-05-30 ENCOUNTER — Encounter: Payer: Self-pay | Admitting: Internal Medicine

## 2019-05-30 DIAGNOSIS — N184 Chronic kidney disease, stage 4 (severe): Secondary | ICD-10-CM

## 2019-05-30 DIAGNOSIS — N183 Chronic kidney disease, stage 3 unspecified: Secondary | ICD-10-CM

## 2019-05-30 DIAGNOSIS — I129 Hypertensive chronic kidney disease with stage 1 through stage 4 chronic kidney disease, or unspecified chronic kidney disease: Secondary | ICD-10-CM | POA: Diagnosis not present

## 2019-05-30 DIAGNOSIS — M810 Age-related osteoporosis without current pathological fracture: Secondary | ICD-10-CM | POA: Diagnosis not present

## 2019-05-30 NOTE — Progress Notes (Signed)
Location:  [place] Room Number: 109-W Place of Service:  SNF (31)  Hennie Duos, MD  Patient Care Team: Hennie Duos, MD as PCP - General (Internal Medicine) Nyoka Cowden Phylis Bougie, NP as Nurse Practitioner ([place]) Center, [place] ([place])  Extended Emergency Contact Information Primary Emergency Contact: [name] Address: [address]          [place], [place] [address] [place] Phone: [phone] Relation: Other Secondary Emergency Contact: [name], [place] [address] [name] of [place] Phone: [phone] Mobile Phone: [phone] Relation: Niece    Allergies: Patient has no known allergies.  Chief Complaint  Patient presents with  . Medical Management of Chronic Issues    Routine [place] visit    HPI: Patient is a 85 y.o. female who is being seen for routine issues of hypertension, osteoporosis, and chronic kidney disease stage III/IV.  Past Medical History:  Diagnosis Date  . Anemia   . Anxiety   . Dementia ([place])   . Dysphagia, oropharyngeal phase   . GERD (gastroesophageal reflux disease)   . Hyperpotassemia   . Hyperpotassemia   . Hypertension   . Other severe protein-calorie malnutrition     Past Surgical History:  Procedure Laterality Date  . ABDOMINAL HYSTERECTOMY    . APPENDECTOMY      Allergies as of 05/30/2019   No Known Allergies     Medication List    Notice   This visit is during an admission. Changes to the med list made in this visit will be reflected in the After Visit Summary of the admission.     No orders of the defined types were placed in this encounter.   Immunization History  Administered Date(s) Administered  . Influenza-Unspecified 05/23/2014, 05/18/2016, 05/20/2017, 05/18/2018  . Pneumococcal Conjugate-13 11/07/2015  . Pneumococcal-Unspecified 05/25/2016  . Tdap 04/28/2017    Social  History   Tobacco Use  . Smoking status: Never Smoker  . Smokeless tobacco: Never Used  Substance Use Topics  . Alcohol use: No    Review of Systems     unable to obtain secondary to dementia; nursing-no acute concerns     Vitals:   05/30/19 1118  BP: 122/65  Pulse: 70  Resp: 16  Temp: 98.1 F (36.7 C)  SpO2: 97%   Body mass index is 16.91 kg/m. Physical Exam  GENERAL APPEARANCE: Alert,  No acute distress  SKIN: No diaphoresis rash HEENT: Unremarkable RESPIRATORY: Breathing is even, unlabored. Lung sounds are clear   CARDIOVASCULAR: Heart RRR no murmurs, rubs or gallops. No peripheral edema  GASTROINTESTINAL: Abdomen is soft, non-tender, not distended w/ normal bowel sounds.  GENITOURINARY: Bladder non tender, not distended  MUSCULOSKELETAL: No abnormal joints or musculature except very thin NEUROLOGIC: Cranial nerves 2-12 grossly intact. Moves all extremities PSYCHIATRIC: Dementia, no behavioral issues  Patient Active Problem List   Diagnosis Date Noted  . Anemia due to stage 4 chronic kidney disease ([place]) 03/29/2019  . Hypercalcemia 02/19/2019  . Weight loss, non-intentional 10/19/2018  . Osteoporosis 05/10/2017  . Hyperkalemia 08/02/2016  . Chronic renal disease, stage 4, severely decreased glomerular filtration rate (GFR) between 15-29 mL/min/1.73 square meter (HCC) 08/02/2016  . GERD (gastroesophageal reflux disease) 05/25/2016  . Allergic rhinitis 05/01/2014  . Dementia ([place]) 05/10/2013  . Benign hypertension with chronic kidney disease, stage III 02/13/2013    CMP     Component  Value Date/Time   NA 139 02/22/2019 0800   K 4.8 02/22/2019 0800   CL 101 02/22/2019 0800   CO2 28 02/22/2019 0800   GLUCOSE 88 02/22/2019 0800   BUN 81 (H) 02/22/2019 0800   CREATININE 1.57 (H) 02/22/2019 0800   CALCIUM 10.6 (H) 02/22/2019 0800   PROT 6.8 02/19/2019 0348   ALBUMIN 3.4 (L) 02/19/2019 0348   AST 20 02/19/2019 0348   ALT 14 02/19/2019 0348   ALKPHOS 51  02/19/2019 0348   BILITOT 0.8 02/19/2019 0348   GFRNONAA 28 (L) 02/22/2019 0800   GFRAA 32 (L) 02/22/2019 0800   Recent Labs    09/18/18 0130 02/19/19 0348 02/22/19 0800  NA 135 138 139  K 4.3 5.4* 4.8  CL 107 103 101  CO2 21* 27 28  GLUCOSE 87 91 88  BUN 47* 88* 81*  CREATININE 1.39* 1.76* 1.57*  CALCIUM 8.8* 11.0* 10.6*   Recent Labs    09/15/18 1230 02/19/19 0348  AST 22 20  ALT 16 14  ALKPHOS 54 51  BILITOT 0.8 0.8  PROT 7.0 6.8  ALBUMIN 3.9 3.4*   Recent Labs    08/28/18 0730 09/15/18 1230 02/19/19 0348  WBC 4.8 4.5 5.0  NEUTROABS 2.6 2.5  --   HGB 9.1* 10.5* 10.0*  HCT 28.9* 33.5* 31.6*  MCV 103.6* 105.0* 105.3*  PLT 186 219 177   No results for input(s): CHOL, LDLCALC, TRIG in the last 8760 hours.  Invalid input(s): HCL No results found for: MICROALBUR Lab Results  Component Value Date   TSH 2.703 02/19/2019   No results found for: HGBA1C No results found for: CHOL, HDL, LDLCALC, LDLDIRECT, TRIG, CHOLHDL  Significant Diagnostic Results in last 30 days:  No results found.  Assessment and Plan  Benign hypertension with chronic kidney disease, stage III (HCC) Controlled; continue labetalol 200 mg twice daily  Osteoporosis Stable without any fracture; continue Prolia 60 mg subcu every 6 months  Chronic renal disease, stage 4, severely decreased glomerular filtration rate (GFR) between 15-29 mL/min/1.73 square meter (HCC) GFR is 32 which is improved from prior with a creatinine of 1.57 which is also improved from prior; patient is on the borderline of chronic kidney disease stage III and stage IV; monitor intervals     Hennie Duos, MD

## 2019-06-03 ENCOUNTER — Encounter: Payer: Self-pay | Admitting: Internal Medicine

## 2019-06-03 NOTE — Assessment & Plan Note (Signed)
GFR is 32 which is improved from prior with a creatinine of 1.57 which is also improved from prior; patient is on the borderline of chronic kidney disease stage III and stage IV; monitor intervals

## 2019-06-03 NOTE — Assessment & Plan Note (Signed)
Controlled; continue labetalol 200 mg twice daily

## 2019-06-03 NOTE — Assessment & Plan Note (Signed)
Stable without any fracture; continue Prolia 60 mg subcu every 6 months

## 2019-06-06 ENCOUNTER — Other Ambulatory Visit: Payer: Self-pay | Admitting: Adult Health

## 2019-07-03 ENCOUNTER — Encounter: Payer: Self-pay | Admitting: Adult Health

## 2019-07-03 ENCOUNTER — Non-Acute Institutional Stay (SKILLED_NURSING_FACILITY): Payer: Medicare Other | Admitting: Adult Health

## 2019-07-03 DIAGNOSIS — I129 Hypertensive chronic kidney disease with stage 1 through stage 4 chronic kidney disease, or unspecified chronic kidney disease: Secondary | ICD-10-CM

## 2019-07-03 DIAGNOSIS — E875 Hyperkalemia: Secondary | ICD-10-CM

## 2019-07-03 DIAGNOSIS — N184 Chronic kidney disease, stage 4 (severe): Secondary | ICD-10-CM

## 2019-07-03 NOTE — Progress Notes (Signed)
Location:    [place] Room Number: 109/W Place of Service:  SNF (31)   CODE STATUS: DNR  No Known Allergies  Chief Complaint  Patient presents with  . Medical Management of Chronic Issues          Stage 4 chronic kidney disease:  . Chronic hyperkalemia: Benign hypertension with chronic renal disease stage IV:     HPI   She is a 85 year old long term resident of this facility being seen for the management of her chronic illnesses; ckd; hyperkalemia; hypertension. There are no reports of uncontrolled pain;no reports of agitation or anxiety. Her weight is stable.    Past Medical History:  Diagnosis Date  . Anemia   . Anxiety   . Dementia ([place])   . Dysphagia, oropharyngeal phase   . GERD (gastroesophageal reflux disease)   . Hyperpotassemia   . Hyperpotassemia   . Hypertension   . Other severe protein-calorie malnutrition     Past Surgical History:  Procedure Laterality Date  . ABDOMINAL HYSTERECTOMY    . APPENDECTOMY      Social History   Socioeconomic History  . Marital status: Widowed    Spouse name: Not on file  . Number of children: Not on file  . Years of education: Not on file  . Highest education level: Not on file  Occupational History  . Not on file  Social Needs  . Financial resource strain: Not hard at all  . Food insecurity    Worry: Never true    Inability: Never true  . Transportation needs    Medical: No    Non-medical: No  Tobacco Use  . Smoking status: Never Smoker  . Smokeless tobacco: Never Used  Substance and Sexual Activity  . Alcohol use: No  . Drug use: No  . Sexual activity: Never  Lifestyle  . Physical activity    Days per week: 0 days    Minutes per session: 0 min  . Stress: Not at all  Relationships  . Social Herbalist on phone: Never    Gets together: Once a week    Attends religious service: Never    Active member of club or organization: No    Attends meetings of clubs or  organizations: Never    Relationship status: Widowed  . Intimate partner violence    Fear of current or ex partner: No    Emotionally abused: No    Physically abused: No    Forced sexual activity: No  Other Topics Concern  . Not on file  Social History Narrative  . Not on file   No family history on file.    VITAL SIGNS BP 132/72   Pulse 62   Temp 98.1 F (36.7 C) (Oral)   Resp 16   Ht 5' 5.5" (1.664 m)   Wt 104 lb 6.4 oz (47.4 kg)   SpO2 97%   BMI 17.11 kg/m   Outpatient Encounter Medications as of 07/03/2019  Medication Sig  . denosumab (PROLIA) 60 MG/ML SOSY injection Inject 60 mg into the skin every 6 (six) months. Give on the 26th of every 6th month  . Ferrous Sulfate (IRON) 325 (65 Fe) MG TABS Take 1 tablet by mouth daily.   . hydrocortisone (ANUSOL-HC) 2.5 % rectal cream Apply 1 application topically 2 (two) times daily as needed for hemorrhoids or anal itching.  . labetalol (NORMODYNE) 200 MG tablet Take 200 mg by mouth 2 (  two) times daily. For HTN  . NON FORMULARY Diet Type:  Mechanical soft Diet  . Nutritional Supplements (ENSURE ENLIVE PO) Take 1 Bottle by mouth 3 (three) times daily with meals.   . sodium polystyrene (KAYEXALATE) 15 GM/60ML suspension Take 15 g by mouth See admin instructions. Once a day on Sun,Tue,Thu   No facility-administered encounter medications on file as of 07/03/2019.      SIGNIFICANT DIAGNOSTIC EXAMS   LABS REVIEWED PREVIOUS:   09-15-18: wbc 4.5; hgb 10.5; hct 33.5; mcv 105.0; plt 219; glucose 83; bun 52; creat 1.40 ;k+ 4.9; na++ 138; ca 9.3; liver normal albumin 3.9  tsh 2.495; urine culture: proteus mirabilis 09-18-18: glucose 87; bun 47; creat 1.39; k+ 4.3; na++ 135; ca 8.8  02-19-19: wbc 5.0; hgb 10.0; hct 31.6; mcv 105.3; plt 177; glucose 91; bun 88; creat 1.76; k+ 5.4; na++ 138; ca 11.0; liver normal albumin 3.4; tsh 2.703 02-22-19: glucose 88; bun 81; creat 1.57; k+ 4.8; na++ 139 ca 10.6  NO NEW LABS.   Review of Systems   Unable to perform ROS: Dementia (unable to participate )   Physical Exam Constitutional:      General: She is not in acute distress.    Appearance: She is well-developed. She is not diaphoretic.     Comments: thin  Neck:     Musculoskeletal: Neck supple.     Thyroid: No thyromegaly.  Cardiovascular:     Rate and Rhythm: Normal rate and regular rhythm.     Pulses: Normal pulses.     Heart sounds: Normal heart sounds.  Pulmonary:     Effort: Pulmonary effort is normal. No respiratory distress.     Breath sounds: Normal breath sounds.  Abdominal:     General: Bowel sounds are normal. There is no distension.     Palpations: Abdomen is soft.     Tenderness: There is no abdominal tenderness.  Musculoskeletal:     Right lower leg: No edema.     Left lower leg: No edema.     Comments: Is able to move all extremities   Lymphadenopathy:     Cervical: No cervical adenopathy.  Skin:    General: Skin is warm and dry.  Neurological:     Mental Status: She is alert. Mental status is at baseline.  Psychiatric:        Mood and Affect: Mood normal.       ASSESSMENT/ PLAN:  TODAY:   1. Stage 4 chronic kidney disease: is stable bun 81; creat 1.57; will monitor  2. Chronic hyperkalemia: is stable k+ 5.4 will continue kayexalate 15 gm three times weekly   3. Benign hypertension with chronic renal disease stage IV: is stable b/p 132/72 will continue labetolol 200 mg twice daily and will monitor her status is off norvasc   PREVIOUS   4. GERD without esophagitis: is stable will continue tums 500 mg three times daily  5. Age related osteoporosis without current pathological fracture: is stable will continue prolia every 6 months  6. Anemia due to stage 4 chronic kidney disease: is stable hgb 10.0 will continue iron daily   7. Non-intentional weight loss: is stable current weight is 104 and stable (previous 102, 103) pounds; will continue ensure three times daily   8. Unspecified type  dementia without behavioral disturbance; is without change weight is 104 pounds is stable aricept was stopped due to renal failure     MD is aware of resident's narcotic use and is in  agreement with current plan of care. We will attempt to wean resident as appropriate.  [name] [facility]  Contact [phone] Monday through Friday 8am- 5pm  After hours call [phone]

## 2019-07-10 ENCOUNTER — Non-Acute Institutional Stay (SKILLED_NURSING_FACILITY): Payer: Medicare Other | Admitting: Adult Health

## 2019-07-10 ENCOUNTER — Encounter: Payer: Self-pay | Admitting: Adult Health

## 2019-07-10 DIAGNOSIS — I129 Hypertensive chronic kidney disease with stage 1 through stage 4 chronic kidney disease, or unspecified chronic kidney disease: Secondary | ICD-10-CM | POA: Diagnosis not present

## 2019-07-10 DIAGNOSIS — N184 Chronic kidney disease, stage 4 (severe): Secondary | ICD-10-CM

## 2019-07-10 DIAGNOSIS — F0390 Unspecified dementia without behavioral disturbance: Secondary | ICD-10-CM

## 2019-07-10 DIAGNOSIS — D631 Anemia in chronic kidney disease: Secondary | ICD-10-CM | POA: Diagnosis not present

## 2019-07-10 NOTE — Progress Notes (Signed)
Location:    [place] Room Number: 109/W Place of Service:  SNF (31)   CODE STATUS: DNR  No Known Allergies  Chief Complaint  Patient presents with  . Acute Visit    Care Plan Meeting     HPI:  We have come together for her care plan meeting. BIMS 5/15 mood 3/30. She has not had any recent falls. Her weight is stable; her appetite is poor; however; she tends to snack all day long. There are no repots of uncontrolled pain; no reports of anxiety or agitation. She continues to be followed for her chronic illnesses including: dementia; ckd; anemia.   Past Medical History:  Diagnosis Date  . Anemia   . Anxiety   . Dementia ([place])   . Dysphagia, oropharyngeal phase   . GERD (gastroesophageal reflux disease)   . Hyperpotassemia   . Hyperpotassemia   . Hypertension   . Other severe protein-calorie malnutrition     Past Surgical History:  Procedure Laterality Date  . ABDOMINAL HYSTERECTOMY    . APPENDECTOMY      Social History   Socioeconomic History  . Marital status: Widowed    Spouse name: Not on file  . Number of children: Not on file  . Years of education: Not on file  . Highest education level: Not on file  Occupational History  . Not on file  Social Needs  . Financial resource strain: Not hard at all  . Food insecurity    Worry: Never true    Inability: Never true  . Transportation needs    Medical: No    Non-medical: No  Tobacco Use  . Smoking status: Never Smoker  . Smokeless tobacco: Never Used  Substance and Sexual Activity  . Alcohol use: No  . Drug use: No  . Sexual activity: Never  Lifestyle  . Physical activity    Days per week: 0 days    Minutes per session: 0 min  . Stress: Not at all  Relationships  . Social Herbalist on phone: Never    Gets together: Once a week    Attends religious service: Never    Active member of club or organization: No    Attends meetings of clubs or organizations: Never   Relationship status: Widowed  . Intimate partner violence    Fear of current or ex partner: No    Emotionally abused: No    Physically abused: No    Forced sexual activity: No  Other Topics Concern  . Not on file  Social History Narrative  . Not on file   No family history on file.    VITAL SIGNS BP 124/76   Pulse 62   Temp (!) 96.9 F (36.1 C) (Oral)   Resp 16   Ht 5' 5.5" (1.664 m)   Wt 104 lb 6.4 oz (47.4 kg)   SpO2 97%   BMI 17.11 kg/m   Outpatient Encounter Medications as of 07/10/2019  Medication Sig  . denosumab (PROLIA) 60 MG/ML SOSY injection Inject 60 mg into the skin every 6 (six) months. Give on the 26th of every 6th month  . Ferrous Sulfate (IRON) 325 (65 Fe) MG TABS Take 1 tablet by mouth daily.   . hydrocortisone (ANUSOL-HC) 2.5 % rectal cream Apply 1 application topically 2 (two) times daily as needed for hemorrhoids or anal itching.  . labetalol (NORMODYNE) 200 MG tablet Take 200 mg by mouth 2 (two) times daily. For  HTN  . NON FORMULARY Diet Type:  Mechanical soft Diet  . Nutritional Supplements (ENSURE ENLIVE PO) Take 1 Bottle by mouth 3 (three) times daily with meals.   . sodium polystyrene (KAYEXALATE) 15 GM/60ML suspension Take 15 g by mouth See admin instructions. Once a day on Sun,Tue,Thu   No facility-administered encounter medications on file as of 07/10/2019.      SIGNIFICANT DIAGNOSTIC EXAMS   LABS REVIEWED PREVIOUS:   09-15-18: wbc 4.5; hgb 10.5; hct 33.5; mcv 105.0; plt 219; glucose 83; bun 52; creat 1.40 ;k+ 4.9; na++ 138; ca 9.3; liver normal albumin 3.9  tsh 2.495; urine culture: proteus mirabilis 09-18-18: glucose 87; bun 47; creat 1.39; k+ 4.3; na++ 135; ca 8.8  02-19-19: wbc 5.0; hgb 10.0; hct 31.6; mcv 105.3; plt 177; glucose 91; bun 88; creat 1.76; k+ 5.4; na++ 138; ca 11.0; liver normal albumin 3.4; tsh 2.703 02-22-19: glucose 88; bun 81; creat 1.57; k+ 4.8; na++ 139 ca 10.6  NO NEW LABS.   Review of Systems  Unable to perform ROS:  Dementia (unable to participate )   Physical Exam Constitutional:      General: She is not in acute distress.    Appearance: She is well-developed. She is not diaphoretic.  Neck:     Musculoskeletal: Neck supple.     Thyroid: No thyromegaly.  Cardiovascular:     Rate and Rhythm: Normal rate and regular rhythm.     Pulses: Normal pulses.     Heart sounds: Normal heart sounds.  Pulmonary:     Effort: Pulmonary effort is normal. No respiratory distress.     Breath sounds: Normal breath sounds.  Abdominal:     General: Bowel sounds are normal. There is no distension.     Palpations: Abdomen is soft.     Tenderness: There is no abdominal tenderness.  Musculoskeletal: Normal range of motion.     Right lower leg: No edema.     Left lower leg: No edema.  Lymphadenopathy:     Cervical: No cervical adenopathy.  Skin:    General: Skin is warm and dry.  Neurological:     Mental Status: She is alert. Mental status is at baseline.  Psychiatric:        Mood and Affect: Mood normal.       ASSESSMENT/ PLAN:  TODAY;   1. Dementia without behavioral disturbance unspecified dementia type 2. Benign hypertension with stage 4 chronic kidney disease 3. Anemia due to stage 4 chronic kidney disease.  Will continue current medications Will continue current plan of care Will continue to monitor her status     MD is aware of resident's narcotic use and is in agreement with current plan of care. We will attempt to wean resident as appropriate.  [name] [facility]  Contact [phone] Monday through Friday 8am- 5pm  After hours call [phone]

## 2019-07-22 ENCOUNTER — Other Ambulatory Visit: Payer: Self-pay | Admitting: Internal Medicine

## 2019-07-22 ENCOUNTER — Other Ambulatory Visit (HOSPITAL_COMMUNITY)
Admission: RE | Admit: 2019-07-22 | Discharge: 2019-07-22 | Disposition: A | Discharge status: Home / Self Care | Payer: Medicare Other | Source: Ambulatory Visit | Attending: Internal Medicine | Admitting: Internal Medicine

## 2019-07-22 DIAGNOSIS — Z9189 Other specified personal risk factors, not elsewhere classified: Secondary | ICD-10-CM

## 2019-07-22 DIAGNOSIS — Z20828 Contact with and (suspected) exposure to other viral communicable diseases: Secondary | ICD-10-CM | POA: Insufficient documentation

## 2019-07-24 LAB — SARS CORONAVIRUS 2 (TAT 6-24 HRS): SARS Coronavirus 2: NEGATIVE

## 2019-07-28 ENCOUNTER — Other Ambulatory Visit: Payer: Self-pay | Admitting: Internal Medicine

## 2019-07-28 ENCOUNTER — Other Ambulatory Visit (HOSPITAL_COMMUNITY)
Admission: RE | Admit: 2019-07-28 | Discharge: 2019-07-28 | Disposition: A | Discharge status: Home / Self Care | Payer: Medicare Other | Source: Ambulatory Visit | Attending: Internal Medicine | Admitting: Internal Medicine

## 2019-07-28 DIAGNOSIS — Z20828 Contact with and (suspected) exposure to other viral communicable diseases: Secondary | ICD-10-CM | POA: Diagnosis present

## 2019-07-28 DIAGNOSIS — Z9189 Other specified personal risk factors, not elsewhere classified: Secondary | ICD-10-CM

## 2019-07-28 LAB — SARS CORONAVIRUS 2 (TAT 6-24 HRS): SARS Coronavirus 2: NEGATIVE

## 2019-08-01 ENCOUNTER — Other Ambulatory Visit (HOSPITAL_COMMUNITY)
Admission: RE | Admit: 2019-08-01 | Discharge: 2019-08-01 | Disposition: A | Discharge status: Home / Self Care | Payer: Medicare Other | Source: Ambulatory Visit | Attending: Internal Medicine | Admitting: Internal Medicine

## 2019-08-01 DIAGNOSIS — Z20828 Contact with and (suspected) exposure to other viral communicable diseases: Secondary | ICD-10-CM | POA: Insufficient documentation

## 2019-08-02 LAB — NOVEL CORONAVIRUS, NAA (HOSP ORDER, SEND-OUT TO REF LAB; TAT 18-24 HRS): SARS-CoV-2, NAA: NOT DETECTED

## 2019-08-06 ENCOUNTER — Other Ambulatory Visit (HOSPITAL_COMMUNITY)
Admission: RE | Admit: 2019-08-06 | Discharge: 2019-08-06 | Disposition: A | Discharge status: Home / Self Care | Payer: Medicare Other | Source: Ambulatory Visit | Attending: Internal Medicine | Admitting: Internal Medicine

## 2019-08-06 ENCOUNTER — Non-Acute Institutional Stay (SKILLED_NURSING_FACILITY): Payer: Medicare Other | Admitting: Adult Health

## 2019-08-06 ENCOUNTER — Encounter: Payer: Self-pay | Admitting: Adult Health

## 2019-08-06 DIAGNOSIS — N184 Chronic kidney disease, stage 4 (severe): Secondary | ICD-10-CM

## 2019-08-06 DIAGNOSIS — D631 Anemia in chronic kidney disease: Secondary | ICD-10-CM | POA: Diagnosis not present

## 2019-08-06 DIAGNOSIS — Z20828 Contact with and (suspected) exposure to other viral communicable diseases: Secondary | ICD-10-CM | POA: Diagnosis present

## 2019-08-06 DIAGNOSIS — R634 Abnormal weight loss: Secondary | ICD-10-CM | POA: Diagnosis not present

## 2019-08-06 DIAGNOSIS — M810 Age-related osteoporosis without current pathological fracture: Secondary | ICD-10-CM | POA: Diagnosis not present

## 2019-08-06 NOTE — Progress Notes (Signed)
Location:    [place] Room Number: 109/W Place of Service:  SNF (31)   CODE STATUS: DNR  No Known Allergies  Chief Complaint  Patient presents with  . Medical Management of Chronic Issues       Age related osteoporosis without current pathological fracture:  Anemia due to stage 4 chronic kidney disease:  Non-intentional weight loss:    HPI:  She is a 85 year old long term resident of this facility being seen for the management of her chronic illnesses: osteoporosis; anemia; weight loss. There are no reports of uncontrolled pain. There are no report of poor appetite; she is gaining weight at this time. There are no reports of agitation or anxiety.   Past Medical History:  Diagnosis Date  . Anemia   . Anxiety   . Dementia ([place])   . Dysphagia, oropharyngeal phase   . GERD (gastroesophageal reflux disease)   . Hyperpotassemia   . Hyperpotassemia   . Hypertension   . Other severe protein-calorie malnutrition     Past Surgical History:  Procedure Laterality Date  . ABDOMINAL HYSTERECTOMY    . APPENDECTOMY      Social History   Socioeconomic History  . Marital status: Widowed    Spouse name: Not on file  . Number of children: Not on file  . Years of education: Not on file  . Highest education level: Not on file  Occupational History  . Not on file  Tobacco Use  . Smoking status: Never Smoker  . Smokeless tobacco: Never Used  Substance and Sexual Activity  . Alcohol use: No  . Drug use: No  . Sexual activity: Never  Other Topics Concern  . Not on file  Social History Narrative  . Not on file   Social Determinants of Health   Financial Resource Strain:   . Difficulty of Paying Living Expenses: Not on file  Food Insecurity:   . Worried About Charity fundraiser in the Last Year: Not on file  . Ran Out of Food in the Last Year: Not on file  Transportation Needs:   . Lack of Transportation (Medical): Not on file  . Lack of  Transportation (Non-Medical): Not on file  Physical Activity:   . Days of Exercise per Week: Not on file  . Minutes of Exercise per Session: Not on file  Stress:   . Feeling of Stress : Not on file  Social Connections:   . Frequency of Communication with Friends and Family: Not on file  . Frequency of Social Gatherings with Friends and Family: Not on file  . Attends Religious Services: Not on file  . Active Member of Clubs or Organizations: Not on file  . Attends Archivist Meetings: Not on file  . Marital Status: Not on file  Intimate Partner Violence:   . Fear of Current or Ex-Partner: Not on file  . Emotionally Abused: Not on file  . Physically Abused: Not on file  . Sexually Abused: Not on file   No family history on file.    VITAL SIGNS BP (!) 127/47   Pulse 74   Temp (!) 97.4 F (36.3 C) (Oral)   Resp 20   Ht 5' 5.5" (1.664 m)   Wt 106 lb 12.8 oz (48.4 kg)   SpO2 97%   BMI 17.50 kg/m   Outpatient Encounter Medications as of 08/06/2019  Medication Sig  . ascorbic acid (VITAMIN C) 500 MG tablet Take 500  mg by mouth daily.  . Cholecalciferol 1.25 MG (50000 UT) capsule Take 50,000 Units by mouth. Once a day on Wednesday  . denosumab (PROLIA) 60 MG/ML SOSY injection Inject 60 mg into the skin every 6 (six) months. Give on the 26th of every 6th month  . ferrous sulfate 220 (44 Fe) MG/5ML solution Give 7.4 cc by month once daily  . hydrocortisone (ANUSOL-HC) 2.5 % rectal cream Apply 1 application topically 2 (two) times daily as needed for hemorrhoids or anal itching.  . labetalol (NORMODYNE) 200 MG tablet Take 200 mg by mouth 2 (two) times daily. For HTN  . NON FORMULARY Diet Type:  Mechanical soft Diet  . Nutritional Supplements (ENSURE ENLIVE PO) Take 1 Bottle by mouth 3 (three) times daily with meals.   . sodium polystyrene (KAYEXALATE) 15 GM/60ML suspension 87ml; oral  Special Instructions: Mix with 148ml of water Provided, shake vigorously and immediately  prior to administration.MEDICINE IN REFRIGERATOR. Dx: Renal insufficiency with hyperkalemia Once A Day on Sun, Tue, Thu 12:00 PM  . zinc sulfate 220 (50 Zn) MG capsule Take 220 mg by mouth daily.  . [DISCONTINUED] Ferrous Sulfate (IRON) 325 (65 Fe) MG TABS Take 1 tablet by mouth daily.    No facility-administered encounter medications on file as of 08/06/2019.     SIGNIFICANT DIAGNOSTIC EXAMS    LABS REVIEWED PREVIOUS:   09-15-18: wbc 4.5; hgb 10.5; hct 33.5; mcv 105.0; plt 219; glucose 83; bun 52; creat 1.40 ;k+ 4.9; na++ 138; ca 9.3; liver normal albumin 3.9  tsh 2.495; urine culture: proteus mirabilis 09-18-18: glucose 87; bun 47; creat 1.39; k+ 4.3; na++ 135; ca 8.8  02-19-19: wbc 5.0; hgb 10.0; hct 31.6; mcv 105.3; plt 177; glucose 91; bun 88; creat 1.76; k+ 5.4; na++ 138; ca 11.0; liver normal albumin 3.4; tsh 2.703 02-22-19: glucose 88; bun 81; creat 1.57; k+ 4.8; na++ 139 ca 10.6  NO NEW LABS.   Review of Systems  Unable to perform ROS: Dementia (unable to participate )    Physical Exam Constitutional:      General: She is not in acute distress.    Appearance: She is underweight. She is not diaphoretic.  Neck:     Thyroid: No thyromegaly.  Cardiovascular:     Rate and Rhythm: Normal rate and regular rhythm.     Pulses: Normal pulses.     Heart sounds: Normal heart sounds.  Pulmonary:     Effort: Pulmonary effort is normal. No respiratory distress.     Breath sounds: Normal breath sounds.  Abdominal:     General: Bowel sounds are normal. There is no distension.     Palpations: Abdomen is soft.     Tenderness: There is no abdominal tenderness.  Musculoskeletal:        General: Normal range of motion.     Cervical back: Neck supple.     Right lower leg: No edema.     Left lower leg: No edema.  Lymphadenopathy:     Cervical: No cervical adenopathy.  Skin:    General: Skin is warm and dry.  Neurological:     Mental Status: She is alert. Mental status is at baseline.    Psychiatric:        Mood and Affect: Mood normal.     ASSESSMENT/ PLAN:  TODAY:   1. Age related osteoporosis without current pathological fracture: is stable will continue prolia 60 mg every 6 months.   2. Anemia due to stage 4 chronic kidney  disease: is stable hgb 10.0; will continue iron daily   3. Non-intentional weight loss: weight is stable: weight is 106 pounds: will continue ensure three times daily   PREVIOUS   4. GERD without esophagitis: is stable will monitor   5. Unspecified type dementia without behavioral disturbance; is without change weight is 106 pounds is stable aricept was stopped due to renal failure  6. Stage 4 chronic kidney disease: is stable bun 81; creat 1.57; will monitor  7. Chronic hyperkalemia: is stable k+ 5.4 will continue kayexalate 15 gm three times weekly   8. Benign hypertension with chronic renal disease stage IV: is stable b/p 127/47 will continue labetolol 200 mg twice daily and will monitor her status is off norvasc   Will check cbc; cmp   MD is aware of resident's narcotic use and is in agreement with current plan of care. We will attempt to wean resident as appropriate.  [name] [facility]  Contact [phone] Monday through Friday 8am- 5pm  After hours call [phone]

## 2019-08-07 ENCOUNTER — Encounter: Payer: Self-pay | Admitting: Adult Health

## 2019-08-07 ENCOUNTER — Non-Acute Institutional Stay (SKILLED_NURSING_FACILITY): Payer: Medicare Other | Admitting: Adult Health

## 2019-08-07 DIAGNOSIS — I129 Hypertensive chronic kidney disease with stage 1 through stage 4 chronic kidney disease, or unspecified chronic kidney disease: Secondary | ICD-10-CM | POA: Diagnosis not present

## 2019-08-07 DIAGNOSIS — N184 Chronic kidney disease, stage 4 (severe): Secondary | ICD-10-CM

## 2019-08-07 DIAGNOSIS — F0390 Unspecified dementia without behavioral disturbance: Secondary | ICD-10-CM

## 2019-08-07 NOTE — Progress Notes (Signed)
This encounter was created in error - please disregard.

## 2019-08-07 NOTE — Progress Notes (Signed)
Location:  [place] Room Number: 109-W Place of Service:  SNF (31)   CODE STATUS:  DNR  No Known Allergies  Chief Complaint  Patient presents with  . Acute Visit    Patient seen for COVID vaccine    HPI:  We have the mederna vaccine available. She is high risk for covid due her living in snf; advanced age; hypertension; dementia. I have spoken with her family regarding this vaccine 2 injection process side effects and possible severe adverse reactions. They have agreed to the vaccine. There are no reports of uncontrolled pain; no changes in appetite; no anxiety no agitation.    Past Medical History:  Diagnosis Date  . Anemia   . Anxiety   . Dementia ([place])   . Dysphagia, oropharyngeal phase   . GERD (gastroesophageal reflux disease)   . Hyperpotassemia   . Hyperpotassemia   . Hypertension   . Other severe protein-calorie malnutrition     Past Surgical History:  Procedure Laterality Date  . ABDOMINAL HYSTERECTOMY    . APPENDECTOMY      Social History   Socioeconomic History  . Marital status: Widowed    Spouse name: Not on file  . Number of children: Not on file  . Years of education: Not on file  . Highest education level: Not on file  Occupational History  . Not on file  Tobacco Use  . Smoking status: Never Smoker  . Smokeless tobacco: Never Used  Substance and Sexual Activity  . Alcohol use: No  . Drug use: No  . Sexual activity: Never  Other Topics Concern  . Not on file  Social History Narrative  . Not on file   Social Determinants of Health   Financial Resource Strain:   . Difficulty of Paying Living Expenses: Not on file  Food Insecurity:   . Worried About Charity fundraiser in the Last Year: Not on file  . Ran Out of Food in the Last Year: Not on file  Transportation Needs:   . Lack of Transportation (Medical): Not on file  . Lack of Transportation (Non-Medical): Not on file  Physical Activity:   . Days of  Exercise per Week: Not on file  . Minutes of Exercise per Session: Not on file  Stress:   . Feeling of Stress : Not on file  Social Connections:   . Frequency of Communication with Friends and Family: Not on file  . Frequency of Social Gatherings with Friends and Family: Not on file  . Attends Religious Services: Not on file  . Active Member of Clubs or Organizations: Not on file  . Attends Archivist Meetings: Not on file  . Marital Status: Not on file  Intimate Partner Violence:   . Fear of Current or Ex-Partner: Not on file  . Emotionally Abused: Not on file  . Physically Abused: Not on file  . Sexually Abused: Not on file   History reviewed. No pertinent family history.    VITAL SIGNS BP (!) 127/47   Pulse 74   Temp (!) 97.4 F (36.3 C) (Oral)   Resp 20   Ht 5' 5.5" (1.664 m)   Wt 106 lb 12.8 oz (48.4 kg)   SpO2 97%   BMI 17.50 kg/m   Outpatient Encounter Medications as of 08/07/2019  Medication Sig  . Cholecalciferol 1.25 MG (50000 UT) capsule Take 50,000 Units by mouth. Once a day on Wednesday  . denosumab (PROLIA) 60 MG/ML  SOSY injection Inject 60 mg into the skin every 6 (six) months. Give on the 26th of every 6th month  . ferrous sulfate 220 (44 Fe) MG/5ML solution Give 7.4 cc by month once daily  . hydrocortisone (ANUSOL-HC) 2.5 % rectal cream Apply 1 application topically 2 (two) times daily as needed for hemorrhoids or anal itching.  . labetalol (NORMODYNE) 200 MG tablet Take 200 mg by mouth 2 (two) times daily. For HTN  . NON FORMULARY Diet Type:  Mechanical soft Diet  . Nutritional Supplements (ENSURE ENLIVE PO) Take 1 Bottle by mouth 3 (three) times daily with meals.   . sodium polystyrene (KAYEXALATE) 15 GM/60ML suspension 31ml; oral  Special Instructions: Mix with 188ml of water Provided, shake vigorously and immediately prior to administration.MEDICINE IN REFRIGERATOR. Dx: Renal insufficiency with hyperkalemia Once A Day on Sun, Tue, Thu 12:00  PM  . [DISCONTINUED] ascorbic acid (VITAMIN C) 500 MG tablet Take 500 mg by mouth daily.  . [DISCONTINUED] zinc sulfate 220 (50 Zn) MG capsule Take 220 mg by mouth daily.   No facility-administered encounter medications on file as of 08/07/2019.     SIGNIFICANT DIAGNOSTIC EXAMS   LABS REVIEWED PREVIOUS:   09-15-18: wbc 4.5; hgb 10.5; hct 33.5; mcv 105.0; plt 219; glucose 83; bun 52; creat 1.40 ;k+ 4.9; na++ 138; ca 9.3; liver normal albumin 3.9  tsh 2.495; urine culture: proteus mirabilis 09-18-18: glucose 87; bun 47; creat 1.39; k+ 4.3; na++ 135; ca 8.8  02-19-19: wbc 5.0; hgb 10.0; hct 31.6; mcv 105.3; plt 177; glucose 91; bun 88; creat 1.76; k+ 5.4; na++ 138; ca 11.0; liver normal albumin 3.4; tsh 2.703 02-22-19: glucose 88; bun 81; creat 1.57; k+ 4.8; na++ 139 ca 10.6  NO NEW LABS.   Review of Systems  Unable to perform ROS: Dementia (unable to participate )    Physical Exam Constitutional:      General: She is not in acute distress.    Appearance: She is underweight. She is not diaphoretic.  Neck:     Thyroid: No thyromegaly.  Cardiovascular:     Rate and Rhythm: Normal rate and regular rhythm.     Pulses: Normal pulses.     Heart sounds: Normal heart sounds.  Pulmonary:     Effort: Pulmonary effort is normal. No respiratory distress.     Breath sounds: Normal breath sounds.  Abdominal:     General: Bowel sounds are normal. There is no distension.     Palpations: Abdomen is soft.     Tenderness: There is no abdominal tenderness.  Musculoskeletal:        General: Normal range of motion.     Cervical back: Neck supple.     Right lower leg: No edema.     Left lower leg: No edema.  Lymphadenopathy:     Cervical: No cervical adenopathy.  Skin:    General: Skin is warm and dry.  Neurological:     Mental Status: She is alert. Mental status is at baseline.  Psychiatric:        Mood and Affect: Mood normal.      ASSESSMENT/ PLAN:  TODAY  1. Benign hypertension with  chronic kidney disease stage 4 2. Dementia without behavioral disturbance unspecified dementia type  Will setup for the first injection on 08-14-19.      MD is aware of resident's narcotic use and is in agreement with current plan of care. We will attempt to wean resident as appropriate.  [name]  [place] [phone] Monday through Friday 8am- 5pm  After hours call [phone]

## 2019-08-08 LAB — NOVEL CORONAVIRUS, NAA (HOSP ORDER, SEND-OUT TO REF LAB; TAT 18-24 HRS): SARS-CoV-2, NAA: NOT DETECTED

## 2019-08-12 ENCOUNTER — Other Ambulatory Visit (HOSPITAL_COMMUNITY)
Admission: RE | Admit: 2019-08-12 | Discharge: 2019-08-12 | Disposition: A | Discharge status: Home / Self Care | Payer: Medicare Other | Source: Ambulatory Visit | Attending: Adult Health | Admitting: Adult Health

## 2019-08-12 DIAGNOSIS — Z20828 Contact with and (suspected) exposure to other viral communicable diseases: Secondary | ICD-10-CM | POA: Insufficient documentation

## 2019-08-13 ENCOUNTER — Other Ambulatory Visit (HOSPITAL_COMMUNITY)
Admission: RE | Admit: 2019-08-13 | Discharge: 2019-08-13 | Disposition: A | Discharge status: Home / Self Care | Payer: Medicare Other | Source: Skilled Nursing Facility | Attending: Adult Health | Admitting: Adult Health

## 2019-08-13 DIAGNOSIS — D631 Anemia in chronic kidney disease: Secondary | ICD-10-CM | POA: Insufficient documentation

## 2019-08-13 DIAGNOSIS — I129 Hypertensive chronic kidney disease with stage 1 through stage 4 chronic kidney disease, or unspecified chronic kidney disease: Secondary | ICD-10-CM | POA: Insufficient documentation

## 2019-08-13 DIAGNOSIS — N184 Chronic kidney disease, stage 4 (severe): Secondary | ICD-10-CM | POA: Diagnosis not present

## 2019-08-13 LAB — COMPREHENSIVE METABOLIC PANEL
ALT: 12 U/L (ref 0–44)
AST: 19 U/L (ref 15–41)
Albumin: 3.4 g/dL — ABNORMAL LOW (ref 3.5–5.0)
Alkaline Phosphatase: 47 U/L (ref 38–126)
Anion gap: 13 (ref 5–15)
BUN: 52 mg/dL — ABNORMAL HIGH (ref 8–23)
CO2: 24 mmol/L (ref 22–32)
Calcium: 9.5 mg/dL (ref 8.9–10.3)
Chloride: 103 mmol/L (ref 98–111)
Creatinine, Ser: 1.4 mg/dL — ABNORMAL HIGH (ref 0.44–1.00)
GFR calc Af Amer: 37 mL/min — ABNORMAL LOW (ref >60)
GFR calc non Af Amer: 32 mL/min — ABNORMAL LOW (ref >60)
Glucose, Bld: 73 mg/dL (ref 70–99)
Potassium: 4.1 mmol/L (ref 3.5–5.1)
Sodium: 140 mmol/L (ref 135–145)
Total Bilirubin: 0.9 mg/dL (ref 0.3–1.2)
Total Protein: 6.4 g/dL — ABNORMAL LOW (ref 6.5–8.1)

## 2019-08-13 LAB — CBC
HCT: 30.2 % — ABNORMAL LOW (ref 36.0–46.0)
Hemoglobin: 9.8 g/dL — ABNORMAL LOW (ref 12.0–15.0)
MCH: 33.9 pg (ref 26.0–34.0)
MCHC: 32.5 g/dL (ref 30.0–36.0)
MCV: 104.5 fL — ABNORMAL HIGH (ref 80.0–100.0)
Platelets: 185 10*3/uL (ref 150–400)
RBC: 2.89 MIL/uL — ABNORMAL LOW (ref 3.87–5.11)
RDW: 12.4 % (ref 11.5–15.5)
WBC: 5 10*3/uL (ref 4.0–10.5)
nRBC: 0 % (ref 0.0–0.2)

## 2019-08-13 LAB — IRON AND TIBC
Iron: 55 ug/dL (ref 28–170)
Saturation Ratios: 24 % (ref 10.4–31.8)
TIBC: 230 ug/dL — ABNORMAL LOW (ref 250–450)
UIBC: 175 ug/dL

## 2019-08-13 LAB — NOVEL CORONAVIRUS, NAA (HOSP ORDER, SEND-OUT TO REF LAB; TAT 18-24 HRS): SARS-CoV-2, NAA: NOT DETECTED

## 2019-09-17 ENCOUNTER — Encounter: Payer: Self-pay | Admitting: Adult Health

## 2019-09-17 ENCOUNTER — Non-Acute Institutional Stay (SKILLED_NURSING_FACILITY): Payer: Medicare PPO | Admitting: Adult Health

## 2019-09-17 DIAGNOSIS — N184 Chronic kidney disease, stage 4 (severe): Secondary | ICD-10-CM

## 2019-09-17 DIAGNOSIS — E875 Hyperkalemia: Secondary | ICD-10-CM

## 2019-09-17 DIAGNOSIS — F0390 Unspecified dementia without behavioral disturbance: Secondary | ICD-10-CM | POA: Diagnosis not present

## 2019-09-17 NOTE — Progress Notes (Signed)
Location:    [place] Room Number: [id number] Place of Service:  SNF (31) Phillips Grout NP    CODE STATUS: DNR  No Known Allergies  Chief Complaint  Patient presents with  . Medical Management of Chronic Issues        Unspecified type dementia without behavorial disturbance:  Stage 4 chronic kidney disease:   Chronic hyperkalemia:    HPI:  She is a 86 year old long term resident of this facility being seen for the management of her chronic illnesses: dementia; ckd stage 4; hyperkalemia. There are no reports of uncontrolled pain. No reports of agitation or insomnia. She is slowly losing weight.   Past Medical History:  Diagnosis Date  . Anemia   . Anxiety   . Dementia ([place])   . Dysphagia, oropharyngeal phase   . GERD (gastroesophageal reflux disease)   . Hyperpotassemia   . Hyperpotassemia   . Hypertension   . Other severe protein-calorie malnutrition     Past Surgical History:  Procedure Laterality Date  . ABDOMINAL HYSTERECTOMY    . APPENDECTOMY      Social History   Socioeconomic History  . Marital status: Widowed    Spouse name: Not on file  . Number of children: Not on file  . Years of education: Not on file  . Highest education level: Not on file  Occupational History  . Not on file  Tobacco Use  . Smoking status: Never Smoker  . Smokeless tobacco: Never Used  Substance and Sexual Activity  . Alcohol use: No  . Drug use: No  . Sexual activity: Never  Other Topics Concern  . Not on file  Social History Narrative  . Not on file   Social Determinants of Health   Financial Resource Strain:   . Difficulty of Paying Living Expenses: Not on file  Food Insecurity:   . Worried About Charity fundraiser in the Last Year: Not on file  . Ran Out of Food in the Last Year: Not on file  Transportation Needs:   . Lack of Transportation (Medical): Not on file  . Lack of Transportation (Non-Medical): Not on file  Physical Activity:   .  Days of Exercise per Week: Not on file  . Minutes of Exercise per Session: Not on file  Stress:   . Feeling of Stress : Not on file  Social Connections:   . Frequency of Communication with Friends and Family: Not on file  . Frequency of Social Gatherings with Friends and Family: Not on file  . Attends Religious Services: Not on file  . Active Member of Clubs or Organizations: Not on file  . Attends Archivist Meetings: Not on file  . Marital Status: Not on file  Intimate Partner Violence:   . Fear of Current or Ex-Partner: Not on file  . Emotionally Abused: Not on file  . Physically Abused: Not on file  . Sexually Abused: Not on file   History reviewed. No pertinent family history.    VITAL SIGNS BP (!) 134/55   Pulse 62   Temp 98.2 F (36.8 C) (Oral)   Resp 18   Ht 5' 5.5" (1.664 m)   Wt 103 lb 6.4 oz (46.9 kg)   SpO2 97%   BMI 16.94 kg/m   Outpatient Encounter Medications as of 09/17/2019  Medication Sig  . Cholecalciferol 1.25 MG (50000 UT) capsule Take 50,000 Units by mouth. Once a day on Wednesday  .  denosumab (PROLIA) 60 MG/ML SOSY injection Inject 60 mg into the skin every 6 (six) months. Give on the 26th of every 6th month  . ferrous sulfate 220 (44 Fe) MG/5ML solution Give 7.4 cc by month once daily  . hydrocortisone (ANUSOL-HC) 2.5 % rectal cream Apply 1 application topically 2 (two) times daily as needed for hemorrhoids or anal itching.  . labetalol (NORMODYNE) 200 MG tablet Take 200 mg by mouth 2 (two) times daily. For HTN  . NON FORMULARY Diet Type:  Mechanical soft Diet  . Nutritional Supplements (ENSURE ENLIVE PO) Take 1 Bottle by mouth 3 (three) times daily with meals.   . sodium polystyrene (KAYEXALATE) 15 GM/60ML suspension 27ml; oral  Special Instructions: Mix with 164ml of water Provided, shake vigorously and immediately prior to administration.MEDICINE IN REFRIGERATOR. Dx: Renal insufficiency with hyperkalemia Once A Day on Sun, Tue, Thu 12:00  PM   No facility-administered encounter medications on file as of 09/17/2019.     SIGNIFICANT DIAGNOSTIC EXAMS   LABS REVIEWED PREVIOUS:   09-15-18: wbc 4.5; hgb 10.5; hct 33.5; mcv 105.0; plt 219; glucose 83; bun 52; creat 1.40 ;k+ 4.9; na++ 138; ca 9.3; liver normal albumin 3.9  tsh 2.495; urine culture: proteus mirabilis 09-18-18: glucose 87; bun 47; creat 1.39; k+ 4.3; na++ 135; ca 8.8  02-19-19: wbc 5.0; hgb 10.0; hct 31.6; mcv 105.3; plt 177; glucose 91; bun 88; creat 1.76; k+ 5.4; na++ 138; ca 11.0; liver normal albumin 3.4; tsh 2.703 02-22-19: glucose 88; bun 81; creat 1.57; k+ 4.8; na++ 139 ca 10.6  TODAY  08-13-19: wbc 5.0; hgb 9.8; hct 30.2; mcv 104.5 plt 185; glucose 73; bun 52; creat 1.40 k+ 4.1; na++ 140; ca 9.5; liver normal albumin 3.4 iron 55; tibc 230   Review of Systems  Unable to perform ROS: Dementia (unable to participate )   Physical Exam Constitutional:      General: She is not in acute distress.    Appearance: She is well-developed. She is not diaphoretic.  Neck:     Thyroid: No thyromegaly.  Cardiovascular:     Rate and Rhythm: Normal rate and regular rhythm.     Heart sounds: Normal heart sounds.  Pulmonary:     Effort: Pulmonary effort is normal. No respiratory distress.     Breath sounds: Normal breath sounds.  Abdominal:     General: Bowel sounds are normal. There is no distension.     Palpations: Abdomen is soft.     Tenderness: There is no abdominal tenderness.  Musculoskeletal:        General: Normal range of motion.  Lymphadenopathy:     Cervical: No cervical adenopathy.  Skin:    General: Skin is warm and dry.  Neurological:     Mental Status: She is alert. Mental status is at baseline.  Psychiatric:        Mood and Affect: Mood normal.       ASSESSMENT/ PLAN:   TODAY:   1. Unspecified type dementia without behavorial disturbance: is without change weight is 103 pounds; is off aricept due to weight and renal failure. Weight loss is  an unfortunate outcome in the lat stages of this disease state.   2. Stage 4 chronic kidney disease: is stable bun 52; creat 1.40 will monitor   3. Chronic hyperkalemia: is stable k+ 5.2 will continue kayexalate 15 gm three times weekly    PREVIOUS   4. GERD without esophagitis: is stable will monitor   5. Benign  hypertension with chronic renal disease stage IV: is stable b/p 127/47 will continue labetolol 200 mg twice daily and will monitor her status is off norvasc   6. Age related osteoporosis without current pathological fracture: is stable will continue prolia 60 mg every 6 months.   7. Anemia due to stage 4 chronic kidney disease: is stable hgb 9.8 will continue iron daily   8. Non-intentional weight loss: is slowly losing weight  weight is 103 pounds: will continue ensure three times daily   9. Vit D deficiency is stable will continue vitamin d 50,000 units weekly   MD is aware of resident's narcotic use and is in agreement with current plan of care. We will attempt to wean resident as appropriate.  [name] [facility]  Contact [phone] Monday through Friday 8am- 5pm  After hours call [phone]

## 2019-10-12 ENCOUNTER — Other Ambulatory Visit (HOSPITAL_COMMUNITY)
Admission: RE | Admit: 2019-10-12 | Discharge: 2019-10-12 | Disposition: A | Discharge status: Home / Self Care | Payer: Medicare PPO | Source: Skilled Nursing Facility | Attending: Adult Health | Admitting: Adult Health

## 2019-10-12 DIAGNOSIS — E559 Vitamin D deficiency, unspecified: Secondary | ICD-10-CM | POA: Diagnosis present

## 2019-10-12 LAB — VITAMIN D 25 HYDROXY (VIT D DEFICIENCY, FRACTURES): Vit D, 25-Hydroxy: 72.53 ng/mL (ref 30–100)

## 2019-10-16 ENCOUNTER — Non-Acute Institutional Stay (SKILLED_NURSING_FACILITY): Payer: Medicare PPO | Admitting: Adult Health

## 2019-10-16 ENCOUNTER — Encounter: Payer: Self-pay | Admitting: Adult Health

## 2019-10-16 DIAGNOSIS — N184 Chronic kidney disease, stage 4 (severe): Secondary | ICD-10-CM | POA: Diagnosis not present

## 2019-10-16 DIAGNOSIS — D631 Anemia in chronic kidney disease: Secondary | ICD-10-CM

## 2019-10-16 DIAGNOSIS — I129 Hypertensive chronic kidney disease with stage 1 through stage 4 chronic kidney disease, or unspecified chronic kidney disease: Secondary | ICD-10-CM | POA: Diagnosis not present

## 2019-10-16 DIAGNOSIS — M810 Age-related osteoporosis without current pathological fracture: Secondary | ICD-10-CM | POA: Diagnosis not present

## 2019-10-16 NOTE — Progress Notes (Signed)
Location:    [place] Room Number: 109/W Place of Service:  SNF (31)   CODE STATUS: DNR  No Known Allergies  Chief Complaint  Patient presents with  . Medical Management of Chronic Issues       Benign hypertension with chronic renal disease stage IV . Age related osteoporosis: without current pathological fracture: Marland Kitchen Anemia due to stage 4 chronic kidney disease:    HPI:  She is a 86 year old long term resident of this facility being seen for the management of her chronic illnesses: hypertension; osteoporosis; anemia. There are no reports of uncontrolled pain; no changes in appetite; weight is stable. No reports of insomnia oir anxiety.   Past Medical History:  Diagnosis Date  . Anemia   . Anxiety   . Dementia ([place])   . Dysphagia, oropharyngeal phase   . GERD (gastroesophageal reflux disease)   . Hyperpotassemia   . Hyperpotassemia   . Hypertension   . Other severe protein-calorie malnutrition     Past Surgical History:  Procedure Laterality Date  . ABDOMINAL HYSTERECTOMY    . APPENDECTOMY      Social History   Socioeconomic History  . Marital status: Widowed    Spouse name: Not on file  . Number of children: Not on file  . Years of education: Not on file  . Highest education level: Not on file  Occupational History  . Not on file  Tobacco Use  . Smoking status: Never Smoker  . Smokeless tobacco: Never Used  Substance and Sexual Activity  . Alcohol use: No  . Drug use: No  . Sexual activity: Never  Other Topics Concern  . Not on file  Social History Narrative  . Not on file   Social Determinants of Health   Financial Resource Strain:   . Difficulty of Paying Living Expenses: Not on file  Food Insecurity:   . Worried About Charity fundraiser in the Last Year: Not on file  . Ran Out of Food in the Last Year: Not on file  Transportation Needs:   . Lack of Transportation (Medical): Not on file  . Lack of Transportation  (Non-Medical): Not on file  Physical Activity:   . Days of Exercise per Week: Not on file  . Minutes of Exercise per Session: Not on file  Stress:   . Feeling of Stress : Not on file  Social Connections:   . Frequency of Communication with Friends and Family: Not on file  . Frequency of Social Gatherings with Friends and Family: Not on file  . Attends Religious Services: Not on file  . Active Member of Clubs or Organizations: Not on file  . Attends Archivist Meetings: Not on file  . Marital Status: Not on file  Intimate Partner Violence:   . Fear of Current or Ex-Partner: Not on file  . Emotionally Abused: Not on file  . Physically Abused: Not on file  . Sexually Abused: Not on file   No family history on file.    VITAL SIGNS BP (!) 115/51   Pulse 65   Temp 98.1 F (36.7 C) (Oral)   Resp 18   Ht 5' 5.5" (1.664 m)   Wt 106 lb (48.1 kg)   SpO2 97%   BMI 17.37 kg/m   Outpatient Encounter Medications as of 10/16/2019  Medication Sig  . alendronate (FOSAMAX) 70 MG/75ML solution Take 70 mg by mouth every 7 (seven) days. Once a day  on Thursday  . ferrous sulfate 220 (44 Fe) MG/5ML solution Give 7.4 cc by month once daily  . hydrocortisone (ANUSOL-HC) 2.5 % rectal cream Apply 1 application topically 2 (two) times daily as needed for hemorrhoids or anal itching.  . labetalol (NORMODYNE) 200 MG tablet Take 200 mg by mouth 2 (two) times daily. For HTN  . NON FORMULARY Diet Type:  Mechanical soft Diet  . Nutritional Supplements (ENSURE ENLIVE PO) Take 1 Bottle by mouth 3 (three) times daily with meals.   . sodium polystyrene (KAYEXALATE) 15 GM/60ML suspension 54ml; oral  Special Instructions: Mix with 117ml of water Provided, shake vigorously and immediately prior to administration.MEDICINE IN REFRIGERATOR. Dx: Renal insufficiency with hyperkalemia Once A Day on Sun, Tue, Thu 12:00 PM  . [DISCONTINUED] Cholecalciferol 1.25 MG (50000 UT) capsule Take 50,000 Units by mouth.  Once a day on Wednesday  . [DISCONTINUED] denosumab (PROLIA) 60 MG/ML SOSY injection Inject 60 mg into the skin every 6 (six) months. Give on the 26th of every 6th month   No facility-administered encounter medications on file as of 10/16/2019.     SIGNIFICANT DIAGNOSTIC EXAMS    LABS REVIEWED PREVIOUS:   02-19-19: wbc 5.0; hgb 10.0; hct 31.6; mcv 105.3; plt 177; glucose 91; bun 88; creat 1.76; k+ 5.4; na++ 138; ca 11.0; liver normal albumin 3.4; tsh 2.703 02-22-19: glucose 88; bun 81; creat 1.57; k+ 4.8; na++ 139 ca 10.6 08-13-19: wbc 5.0; hgb 9.8; hct 30.2; mcv 104.5 plt 185; glucose 73; bun 52; creat 1.40 k+ 4.1; na++ 140; ca 9.5; liver normal albumin 3.4 iron 55; tibc 230  NO NEW LABS.    Review of Systems  Unable to perform ROS: Dementia (unable to participate )   Physical Exam Constitutional:      General: She is not in acute distress.    Appearance: She is well-developed. She is not diaphoretic.  Neck:     Thyroid: No thyromegaly.  Cardiovascular:     Rate and Rhythm: Normal rate and regular rhythm.     Pulses: Normal pulses.     Heart sounds: Normal heart sounds.  Pulmonary:     Effort: Pulmonary effort is normal. No respiratory distress.     Breath sounds: Normal breath sounds.  Abdominal:     General: Bowel sounds are normal. There is no distension.     Palpations: Abdomen is soft.     Tenderness: There is no abdominal tenderness.  Musculoskeletal:        General: Normal range of motion.     Cervical back: Neck supple.     Right lower leg: No edema.     Left lower leg: No edema.  Lymphadenopathy:     Cervical: No cervical adenopathy.  Skin:    General: Skin is warm and dry.  Neurological:     Mental Status: She is alert. Mental status is at baseline.  Psychiatric:        Mood and Affect: Mood normal.      ASSESSMENT/ PLAN:  TODAY:   1. Benign hypertension with chronic renal disease stage IV: is stable b/p 115/51 will continue labetolol 200 mg twice  daily and will monitor  2. Age related osteoporosis: without current pathological fracture: is stable will continue foasamax 70 mg weekly   3. Anemia due to stage 4 chronic kidney disease: is stable hgb 9.8 will monitor    PREVIOUS   4. GERD without esophagitis: is stable will monitor   5. Non-intentional weight loss: is  slowly losing weight  weight is 106 pounds: will continue ensure three times daily   6. Vit D deficiency is stable will continue vitamin d is off supplement   7. Unspecified type dementia without behavorial disturbance: is without change weight is 106 pounds; is off aricept due to weight and renal failure. Weight loss is an unfortunate outcome in the lat stages of this disease state.   8. Stage 4 chronic kidney disease: is stable bun 52; creat 1.40 will monitor   9. Chronic hyperkalemia: is stable k+ 5.2 will continue kayexalate 15 gm three times weekly      MD is aware of resident's narcotic use and is in agreement with current plan of care. We will attempt to wean resident as appropriate.  [name] [facility]  Contact [phone] Monday through Friday 8am- 5pm  After hours call [phone]

## 2019-11-08 ENCOUNTER — Encounter: Payer: Self-pay | Admitting: Adult Health

## 2019-11-08 ENCOUNTER — Non-Acute Institutional Stay (SKILLED_NURSING_FACILITY): Payer: Medicare PPO | Admitting: Adult Health

## 2019-11-08 DIAGNOSIS — I129 Hypertensive chronic kidney disease with stage 1 through stage 4 chronic kidney disease, or unspecified chronic kidney disease: Secondary | ICD-10-CM

## 2019-11-08 DIAGNOSIS — N184 Chronic kidney disease, stage 4 (severe): Secondary | ICD-10-CM

## 2019-11-08 DIAGNOSIS — D631 Anemia in chronic kidney disease: Secondary | ICD-10-CM

## 2019-11-08 DIAGNOSIS — Z0000 Encounter for general adult medical examination without abnormal findings: Secondary | ICD-10-CM

## 2019-11-08 DIAGNOSIS — F0390 Unspecified dementia without behavioral disturbance: Secondary | ICD-10-CM

## 2019-11-08 NOTE — Progress Notes (Signed)
Subjective:   Morgan [name] is a 86 y.o. female who presents for Medicare Annual (Subsequent) preventive examination.long term resident of [facility]   Review of Systems:  Review of Systems  Unable to perform ROS: Dementia (unable to participate )    Cardiac Risk Factors include: advanced age (>16men, >62 women);sedentary lifestyle     Objective:     Vitals: BP 126/69   Pulse 69   Temp 97.8 F (36.6 C) (Oral)   Resp 18   Ht 5' 5.5" (1.664 m)   Wt 106 lb (48.1 kg)   SpO2 97%   BMI 17.37 kg/m   Body mass index is 17.37 kg/m.  Advanced Directives 11/08/2019 10/16/2019 09/17/2019 08/06/2019 07/10/2019 07/03/2019 04/30/2019  Does Patient Have a Medical Advance Directive? Yes Yes Yes Yes Yes Yes Yes  Type of Advance Directive Out of facility DNR (pink MOST or yellow form) Out of facility DNR (pink MOST or yellow form) Out of facility DNR (pink MOST or yellow form) Out of facility DNR (pink MOST or yellow form) Out of facility DNR (pink MOST or yellow form) Out of facility DNR (pink MOST or yellow form) Out of facility DNR (pink MOST or yellow form)  Does patient want to make changes to medical advance directive? No - Patient declined No - Patient declined No - Patient declined No - Patient declined No - Patient declined No - Patient declined No - Patient declined  Copy of [place] in [place]  Would patient like information on creating a medical advance directive? No - Patient declined - - - - - No - Patient declined  Pre-existing out of facility DNR order (yellow form or pink MOST form) Yellow form placed in chart (order not valid for inpatient use) Yellow form placed in chart (order not valid for inpatient use) Yellow form placed in chart (order not valid for inpatient use) Yellow form placed in chart (order not valid for inpatient use) Yellow form placed in chart (order not valid for inpatient use) Yellow form placed in chart (order not valid for inpatient use) Yellow  form placed in chart (order not valid for inpatient use)    Tobacco Social History   Tobacco Use  Smoking Status Never Smoker  Smokeless Tobacco Never Used     Counseling given: Not Answered   Clinical Intake:  Pre-visit preparation completed: Yes  Pain : No/denies pain     BMI - recorded: 17.37 Nutritional Status: BMI <19  Underweight Nutritional Risks: Failure to thrive Diabetes: No  How often do you need to have someone help you when you read instructions, pamphlets, or other written materials from your doctor or pharmacy?: 5 - Always  Interpreter Needed?: No     Past Medical History:  Diagnosis Date  . Anemia   . Anxiety   . Dementia ([place])   . Dysphagia, oropharyngeal phase   . GERD (gastroesophageal reflux disease)   . Hyperpotassemia   . Hyperpotassemia   . Hypertension   . Other severe protein-calorie malnutrition    Past Surgical History:  Procedure Laterality Date  . ABDOMINAL HYSTERECTOMY    . APPENDECTOMY     No family history on file. Social History   Socioeconomic History  . Marital status: Widowed    Spouse name: Not on file  . Number of children: Not on file  . Years of education: Not on file  . Highest education level: Not on file  Occupational History  .  Not on file  Tobacco Use  . Smoking status: Never Smoker  . Smokeless tobacco: Never Used  Substance and Sexual Activity  . Alcohol use: No  . Drug use: No  . Sexual activity: Never  Other Topics Concern  . Not on file  Social History Narrative  . Not on file   Social Determinants of Health   Financial Resource Strain:   . Difficulty of Paying Living Expenses:   Food Insecurity:   . Worried About Charity fundraiser in the Last Year:   . Arboriculturist in the Last Year:   Transportation Needs:   . Film/video editor (Medical):   Marland Kitchen Lack of Transportation (Non-Medical):   Physical Activity:   . Days of Exercise per Week:   . Minutes of Exercise per Session:    Stress:   . Feeling of Stress :   Social Connections:   . Frequency of Communication with Friends and Family:   . Frequency of Social Gatherings with Friends and Family:   . Attends Religious Services:   . Active Member of Clubs or Organizations:   . Attends Archivist Meetings:   Marland Kitchen Marital Status:     Outpatient Encounter Medications as of 11/08/2019  Medication Sig  . ferrous sulfate 220 (44 Fe) MG/5ML solution Give 7.4 cc by month once daily  . hydrocortisone (ANUSOL-HC) 2.5 % rectal cream Apply 1 application topically 2 (two) times daily as needed for hemorrhoids or anal itching.  . labetalol (NORMODYNE) 200 MG tablet Take 200 mg by mouth 2 (two) times daily. For HTN  . NON FORMULARY Diet Type:  Mechanical soft Diet  . Nutritional Supplements (ENSURE ENLIVE PO) Take 1 Bottle by mouth 3 (three) times daily with meals.   . sodium polystyrene (KAYEXALATE) 15 GM/60ML suspension 22ml; oral  Special Instructions: Mix with 134ml of water Provided, shake vigorously and immediately prior to administration.MEDICINE IN REFRIGERATOR. Dx: Renal insufficiency with hyperkalemia Once A Day on Sun, Tue, Thu 12:00 PM  . [DISCONTINUED] alendronate (FOSAMAX) 70 MG/75ML solution Take 70 mg by mouth every 7 (seven) days. Once a day on Thursday   No facility-administered encounter medications on file as of 11/08/2019.    Activities of Daily Living In your present state of health, do you have any difficulty performing the following activities: 11/08/2019  Hearing? N  Vision? N  Difficulty concentrating or making decisions? Y  Walking or climbing stairs? Y  Dressing or bathing? Y  Doing errands, shopping? Y  Preparing Food and eating ? Y  Using the Toilet? Y  In the past six months, have you accidently leaked urine? Y  Do you have problems with loss of bowel control? Y  Managing your Medications? Y  Managing your Finances? Y  Housekeeping or managing your Housekeeping? Y  Some recent  data might be hidden    Patient Care Team: Hennie Duos, MD as PCP - General (Internal Medicine) Nyoka Cowden Phylis Bougie, NP as Nurse Practitioner ([place] [place] ([place])    Assessment:   This is a routine wellness examination for [facility].  Exercise Activities and Dietary recommendations Current Exercise Habits: The patient does not participate in regular exercise at present  Goals    . Follow up with Primary Care Provider       Fall Risk Fall Risk  11/08/2019 05/16/2018 04/25/2017  Falls in the past year? 0 No No  Risk for fall due to : Impaired balance/gait;Impaired mobility - -  Is the patient's home free of loose throw rugs in walkways, pet beds, electrical cords, etc?   yes      Grab bars in the bathroom? yes      Handrails on the stairs?   n/a      Adequate lighting?   yes  Timed Get Up and Go performed: unable to perform   Depression Screen PHQ 2/9 Scores 11/08/2019 05/16/2018 04/25/2017  PHQ - 2 Score - 0 0  Exception Documentation (No Data) - -     Cognitive Function     6CIT Screen 11/08/2019 05/16/2018 04/25/2017  What Year? (No Data) 4 points 4 points  What month? - 3 points 0 points  What time? - 0 points 0 points  Count back from 20 - 0 points 0 points  Months in reverse - 4 points 0 points  Repeat phrase - 6 points 10 points  Total Score - 17 14    Immunization History  Administered Date(s) Administered  . Influenza-Unspecified 05/23/2014, 05/18/2016, 05/20/2017, 05/18/2018, 05/21/2019  . Moderna SARS-COVID-2 Vaccination 08/22/2019, 09/19/2019  . Pneumococcal Conjugate-13 11/07/2015  . Pneumococcal-Unspecified 05/25/2016  . Tdap 04/28/2017    Qualifies for Shingles Vaccine? Long term resident of snf   Screening Tests Health Maintenance  Topic Date Due  . TETANUS/TDAP  04/29/2027  . INFLUENZA VACCINE  Completed  . DEXA SCAN  Completed  . PNA vac Low Risk Adult  Completed    Cancer Screenings: Lung:  Low Dose CT Chest recommended if Age 48-80 years, 30 pack-year currently smoking OR have quit w/in 15years. Patient does not  qualify. Breast:  Up to date on Mammogram? n/a   Up to date of Bone Density/Dexa? N/a Colorectal: n/a  Additional Screenings: n/a Hepatitis C Screening:      Plan:     I have personally reviewed and noted the following in the patient's chart:   . Medical and social history . Use of alcohol, tobacco or illicit drugs  . Current medications and supplements . Functional ability and status . Nutritional status . Physical activity . Advanced directives . List of other physicians . Hospitalizations, surgeries, and ER visits in previous 12 months . Vitals . Screenings to include cognitive, depression, and falls . Referrals and appointments  In addition, I have reviewed and discussed with patient certain preventive protocols, quality metrics, and best practice recommendations. A written personalized care plan for preventive services as well as general preventive health recommendations were provided to patient.     Gerlene Fee, NP  11/08/2019

## 2019-11-08 NOTE — Patient Instructions (Signed)
  Ms. Freundlich , Thank you for taking time to come for your Medicare Wellness Visit. I appreciate your ongoing commitment to your health goals. Please review the following plan we discussed and let me know if I can assist you in the future.   These are the goals we discussed: Goals    . Follow up with Primary Care Provider       This is a list of the screening recommended for you and due dates:  Health Maintenance  Topic Date Due  . Tetanus Vaccine  04/29/2027  . Flu Shot  Completed  . DEXA scan (bone density measurement)  Completed  . Pneumonia vaccines  Completed

## 2019-11-21 ENCOUNTER — Encounter: Payer: Self-pay | Admitting: Internal Medicine

## 2019-11-21 ENCOUNTER — Non-Acute Institutional Stay (SKILLED_NURSING_FACILITY): Payer: Medicare PPO | Admitting: Internal Medicine

## 2019-11-21 DIAGNOSIS — I129 Hypertensive chronic kidney disease with stage 1 through stage 4 chronic kidney disease, or unspecified chronic kidney disease: Secondary | ICD-10-CM | POA: Diagnosis not present

## 2019-11-21 DIAGNOSIS — D631 Anemia in chronic kidney disease: Secondary | ICD-10-CM | POA: Diagnosis not present

## 2019-11-21 DIAGNOSIS — E875 Hyperkalemia: Secondary | ICD-10-CM

## 2019-11-21 DIAGNOSIS — N184 Chronic kidney disease, stage 4 (severe): Secondary | ICD-10-CM | POA: Diagnosis not present

## 2019-11-21 NOTE — Progress Notes (Signed)
Location:  [facility] Room Number: 109-W Place of Service:  SNF (31)  Hennie Duos, MD  Patient Care Team: Hennie Duos, MD as PCP - General (Internal Medicine) Nyoka Cowden Phylis Bougie, NP as Nurse Practitioner ([facility]) [facility], [place] ([place])  Extended Emergency Contact Information Primary Emergency Contact: [name] Address: [address]          [place], [place] [address] [place] Phone: [phone] Relation: Other Secondary Emergency Contact: [name], [place] [address] [name] of [place] Phone: [phone] Mobile Phone: [phone] Relation: Niece    Allergies: Patient has no known allergies.  Chief Complaint  Patient presents with  . Medical Management of Chronic Issues    Routine [place] visit    HPI: Patient is a 86 y.o. female who is being seen for routine issues of [name], Morgan [name], Morgan hypertension.  Past Medical History:  Diagnosis Date  . Morgan [name]   . Anxiety   . Dementia ([place])   . Dysphagia, oropharyngeal phase   . GERD (gastroesophageal reflux [name])   . Hyperpotassemia   . Hyperpotassemia   . Hypertension   . Other severe protein-calorie malnutrition     Past Surgical History:  Procedure Laterality Date  . ABDOMINAL HYSTERECTOMY    . APPENDECTOMY      Allergies as of 11/21/2019   No Known Allergies     Medication List    Notice   This visit is during an admission. Changes to the med list made in this visit will be reflected in the After Visit Summary of the admission.    Current Outpatient Medications on File Prior to Visit  Medication Sig Dispense Refill  . ferrous sulfate 220 (44 Fe) MG/5ML solution Give 7.4 cc by month once daily    . hydrocortisone (ANUSOL-HC) 2.5 % rectal cream Apply 1 application topically 2 (two) times daily as needed for hemorrhoids or anal itching.    . labetalol (NORMODYNE) 200  MG tablet Take 200 mg by mouth 2 (two) times daily. For HTN    . NON FORMULARY Diet Type:  Mechanical soft Diet    . Nutritional Supplements (ENSURE ENLIVE PO) Take 1 Bottle by mouth 3 (three) times daily with meals.     . sodium polystyrene (KAYEXALATE) 15 GM/60ML suspension 57ml; oral  Special Instructions: Mix with 156ml of water Provided, shake vigorously Morgan immediately prior to administration.MEDICINE IN REFRIGERATOR. Dx: Renal insufficiency with [name] Once A Day on Sun, Tue, Thu 12:00 PM     No current facility-administered medications on file prior to visit.     No orders of the defined types were placed in this encounter.   Immunization History  Administered Date(s) Administered  . Influenza-Unspecified 05/23/2014, 05/18/2016, 05/20/2017, 05/18/2018, 05/21/2019  . Moderna SARS-COVID-2 Vaccination 08/22/2019, 09/19/2019  . Pneumococcal Conjugate-13 11/07/2015  . Pneumococcal-Unspecified 05/25/2016  . Tdap 04/28/2017    Social History   Tobacco Use  . Smoking status: Never Smoker  . Smokeless tobacco: Never Used  Substance Use Topics  . Alcohol use: No    Review of Systems   unable to obtain secondary to dementia    Vitals:   11/21/19 1550  BP: 126/69  Pulse: 69  Resp: 18  Temp: 98.1 F (36.7 C)  SpO2: 97%   Body mass index is 17.24 kg/m. Physical Exam  GENERAL APPEARANCE: Alert, conversant, No acute distress  SKIN: No diaphoresis  rash HEENT: Unremarkable RESPIRATORY: Breathing is even, unlabored. Lung sounds are clear   CARDIOVASCULAR: Heart RRR no murmurs, rubs or gallops. No peripheral edema  GASTROINTESTINAL: Abdomen is soft, non-tender, not distended w/ normal bowel sounds.  GENITOURINARY: Bladder non tender, not distended  MUSCULOSKELETAL: No abnormal joints or musculature NEUROLOGIC: Cranial nerves 2-12 grossly intact. Moves all extremities PSYCHIATRIC: Mood Morgan affect appropriate to situation with dementia, no behavioral  issues  Patient Active Problem List   Diagnosis Date Noted  . Morgan [name] due to Morgan 4 chronic kidney [name] ([place]) 03/29/2019  . Hypercalcemia 02/19/2019  . Weight loss, non-intentional 10/19/2018  . Osteoporosis 05/10/2017  . [name] 08/02/2016  . Chronic renal [name], Morgan 4, severely decreased glomerular filtration rate (GFR) between 15-29 mL/min/1.73 square meter (HCC) 08/02/2016  . GERD (gastroesophageal reflux [name]) 05/25/2016  . Allergic rhinitis 05/01/2014  . Dementia ([place]) 05/10/2013  . Morgan [name], Morgan IV (HCC) 02/13/2013    CMP     Component Value Date/Time   NA 140 08/13/2019 0538   K 4.1 08/13/2019 0538   CL 103 08/13/2019 0538   CO2 24 08/13/2019 0538   GLUCOSE 73 08/13/2019 0538   BUN 52 (H) 08/13/2019 0538   CREATININE 1.40 (H) 08/13/2019 0538   CALCIUM 9.5 08/13/2019 0538   PROT 6.4 (L) 08/13/2019 0538   ALBUMIN 3.4 (L) 08/13/2019 0538   AST 19 08/13/2019 0538   ALT 12 08/13/2019 0538   ALKPHOS 47 08/13/2019 0538   BILITOT 0.9 08/13/2019 0538   GFRNONAA 32 (L) 08/13/2019 0538   GFRAA 37 (L) 08/13/2019 0538   Recent Labs    02/19/19 0348 02/22/19 0800 08/13/19 0538  NA 138 139 140  K 5.4* 4.8 4.1  CL 103 101 103  CO2 27 28 24   GLUCOSE 91 88 73  BUN 88* 81* 52*  CREATININE 1.76* 1.57* 1.40*  CALCIUM 11.0* 10.6* 9.5   Recent Labs    02/19/19 0348 08/13/19 0538  AST 20 19  ALT 14 12  ALKPHOS 51 47  BILITOT 0.8 0.9  PROT 6.8 6.4*  ALBUMIN 3.4* 3.4*   Recent Labs    02/19/19 0348 08/13/19 0538  WBC 5.0 5.0  HGB 10.0* 9.8*  HCT 31.6* 30.2*  MCV 105.3* 104.5*  PLT 177 185   No results for input(s): CHOL, LDLCALC, TRIG in the last 8760 hours.  Invalid input(s): HCL No results found for: MICROALBUR Lab Results  Component Value Date   TSH 2.703 02/19/2019   No results found for: HGBA1C No results found for: CHOL, HDL, LDLCALC, LDLDIRECT, TRIG, CHOLHDL  Significant Diagnostic Results  in last 30 days:  No results found.  Assessment Morgan Plan  [name] Chronic Morgan stable; continue Kayexalate 15 g 3 times weekly  Morgan [name] due to Morgan 4 chronic kidney [name] (HCC) Most recent hemoglobin 9.8 which is stable; continue iron 325 mg daily   Morgan [name], Morgan IV (HCC) Stable; continue Normodyne 200 mg twice daily     Hennie Duos, MD

## 2019-11-27 ENCOUNTER — Encounter: Payer: Self-pay | Admitting: Internal Medicine

## 2019-11-27 NOTE — Assessment & Plan Note (Signed)
Stable; continue Normodyne 200 mg twice daily

## 2019-11-27 NOTE — Assessment & Plan Note (Signed)
Chronic and stable; continue Kayexalate 15 g 3 times weekly

## 2019-11-27 NOTE — Assessment & Plan Note (Signed)
Most recent hemoglobin 9.8 which is stable; continue iron 325 mg daily

## 2019-12-01 ENCOUNTER — Encounter: Payer: Self-pay | Admitting: Internal Medicine

## 2019-12-21 ENCOUNTER — Encounter: Payer: Self-pay | Admitting: Adult Health

## 2019-12-21 ENCOUNTER — Non-Acute Institutional Stay (SKILLED_NURSING_FACILITY): Payer: Medicare PPO | Admitting: Adult Health

## 2019-12-21 DIAGNOSIS — F0390 Unspecified dementia without behavioral disturbance: Secondary | ICD-10-CM | POA: Diagnosis not present

## 2019-12-21 DIAGNOSIS — N184 Chronic kidney disease, stage 4 (severe): Secondary | ICD-10-CM

## 2019-12-21 DIAGNOSIS — R634 Abnormal weight loss: Secondary | ICD-10-CM

## 2019-12-21 NOTE — Progress Notes (Signed)
Location:    [facility] Room Number: 109/W Place of Service:  SNF (31)   CODE STATUS: DNR  No Known Allergies  Chief Complaint  Patient presents with  . Medical Management of Chronic Issues         Non-intentional weight loss:   Unspecified type dementia without behavioral disturbance:  Stage 4 chronic kidney disease:    HPI:  She is a 86 year old long term resident of this facility being seen for the management of her chronic illnesses: weight loss; dementia; stage 4 ckd. There are no reports of agitation; anxiety; no reports of uncontrolled pain.   Past Medical History:  Diagnosis Date  . Anemia   . Anxiety   . Dementia ([place])   . Dysphagia, oropharyngeal phase   . GERD (gastroesophageal reflux disease)   . Hyperpotassemia   . Hyperpotassemia   . Hypertension   . Other severe protein-calorie malnutrition     Past Surgical History:  Procedure Laterality Date  . ABDOMINAL HYSTERECTOMY    . APPENDECTOMY      Social History   Socioeconomic History  . Marital status: Widowed    Spouse name: Not on file  . Number of children: Not on file  . Years of education: Not on file  . Highest education level: Not on file  Occupational History  . Not on file  Tobacco Use  . Smoking status: Never Smoker  . Smokeless tobacco: Never Used  Substance and Sexual Activity  . Alcohol use: No  . Drug use: No  . Sexual activity: Never  Other Topics Concern  . Not on file  Social History Narrative  . Not on file   Social Determinants of Health   Financial Resource Strain:   . Difficulty of Paying Living Expenses:   Food Insecurity:   . Worried About Charity fundraiser in the Last Year:   . Arboriculturist in the Last Year:   Transportation Needs:   . Film/video editor (Medical):   Marland Kitchen Lack of Transportation (Non-Medical):   Physical Activity:   . Days of Exercise per Week:   . Minutes of Exercise per Session:   Stress:   . Feeling of Stress :    Social Connections:   . Frequency of Communication with Friends and Family:   . Frequency of Social Gatherings with Friends and Family:   . Attends Religious Services:   . Active Member of Clubs or Organizations:   . Attends Archivist Meetings:   Marland Kitchen Marital Status:   Intimate Partner Violence:   . Fear of Current or Ex-Partner:   . Emotionally Abused:   Marland Kitchen Physically Abused:   . Sexually Abused:    No family history on file.    VITAL SIGNS BP 103/65   Pulse 68   Temp (!) 97 F (36.1 C) (Oral)   Resp 18   Ht 5' 5.5" (1.664 m)   Wt 105 lb 12.8 oz (48 kg)   SpO2 97%   BMI 17.34 kg/m   Outpatient Encounter Medications as of 12/21/2019  Medication Sig  . ferrous sulfate 220 (44 Fe) MG/5ML solution Give 7.4 cc by month once daily  . hydrocortisone (ANUSOL-HC) 2.5 % rectal cream Apply 1 application topically 2 (two) times daily as needed for hemorrhoids or anal itching.  . labetalol (NORMODYNE) 200 MG tablet Take 200 mg by mouth 2 (two) times daily. For HTN  . NON FORMULARY Diet Type:  Mechanical soft Diet  . Nutritional Supplements (ENSURE ENLIVE PO) Take 1 Bottle by mouth 3 (three) times daily with meals.   . sodium polystyrene (KAYEXALATE) 15 GM/60ML suspension 13ml; oral  Special Instructions: Mix with 159ml of water Provided, shake vigorously and immediately prior to administration.MEDICINE IN REFRIGERATOR. Dx: Renal insufficiency with hyperkalemia Once A Day on Sun, Tue, Thu 12:00 PM   No facility-administered encounter medications on file as of 12/21/2019.     SIGNIFICANT DIAGNOSTIC EXAMS   LABS REVIEWED PREVIOUS:   02-19-19: wbc 5.0; hgb 10.0; hct 31.6; mcv 105.3; plt 177; glucose 91; bun 88; creat 1.76; k+ 5.4; na++ 138; ca 11.0; liver normal albumin 3.4; tsh 2.703 02-22-19: glucose 88; bun 81; creat 1.57; k+ 4.8; na++ 139 ca 10.6 08-13-19: wbc 5.0; hgb 9.8; hct 30.2; mcv 104.5 plt 185; glucose 73; bun 52; creat 1.40 k+ 4.1; na++ 140; ca 9.5; liver normal  albumin 3.4 iron 55; tibc 230  TODAY  10-12-19: vit D 72.53    Review of Systems  Unable to perform ROS: Dementia (unable to participate )   Physical Exam Constitutional:      General: She is not in acute distress.    Appearance: She is well-developed. She is not diaphoretic.  Neck:     Thyroid: No thyromegaly.  Cardiovascular:     Rate and Rhythm: Normal rate and regular rhythm.     Pulses: Normal pulses.     Heart sounds: Normal heart sounds.  Pulmonary:     Effort: Pulmonary effort is normal. No respiratory distress.     Breath sounds: Normal breath sounds.  Abdominal:     General: Bowel sounds are normal. There is no distension.     Palpations: Abdomen is soft.     Tenderness: There is no abdominal tenderness.  Musculoskeletal:        General: Normal range of motion.     Cervical back: Neck supple.     Right lower leg: No edema.     Left lower leg: No edema.  Lymphadenopathy:     Cervical: No cervical adenopathy.  Skin:    General: Skin is warm and dry.  Neurological:     Mental Status: She is alert. Mental status is at baseline.  Psychiatric:        Mood and Affect: Mood normal.      ASSESSMENT/ PLAN:  TODAY:   1. Non-intentional weight loss: is slowly losing weight; which is an unfortunate outcome in the late stages of dementia. Her current weight is 105 pounds.   2. Unspecified type dementia without behavioral disturbance: is without significant change weight is 105 pounds; will not make changes will monitor   3. Stage 4 chronic kidney disease: is stable bun 52; creat 1.40 will monitor   PREVIOUS   4. GERD without esophagitis: is stable will monitor   5. Vit D deficiency is stable will continue vitamin d is off supplement   6. Chronic hyperkalemia: is stable k+ 5.2 will continue kayexalate 15 gm three times weekly   7. Benign hypertension with chronic renal disease stage IV: is stable b/p 103/65 will continue labetolol 200 mg twice daily and will  monitor  8. Age related osteoporosis: without current pathological fracture: is stable will continue foasamax 70 mg weekly   9. Anemia due to stage 4 chronic kidney disease: is stable hgb 9.8 will monitor         MD is aware of resident's narcotic use and is in agreement with  current plan of care. We will attempt to wean resident as appropriate.  [name] [facility]  Contact [phone] Monday through Friday 8am- 5pm  After hours call [phone]

## 2019-12-27 ENCOUNTER — Encounter: Payer: Self-pay | Admitting: Adult Health

## 2019-12-27 ENCOUNTER — Non-Acute Institutional Stay (SKILLED_NURSING_FACILITY): Payer: Medicare PPO | Admitting: Adult Health

## 2019-12-27 DIAGNOSIS — F0390 Unspecified dementia without behavioral disturbance: Secondary | ICD-10-CM

## 2019-12-27 DIAGNOSIS — I129 Hypertensive chronic kidney disease with stage 1 through stage 4 chronic kidney disease, or unspecified chronic kidney disease: Secondary | ICD-10-CM

## 2019-12-27 DIAGNOSIS — N184 Chronic kidney disease, stage 4 (severe): Secondary | ICD-10-CM

## 2019-12-27 DIAGNOSIS — D631 Anemia in chronic kidney disease: Secondary | ICD-10-CM | POA: Diagnosis not present

## 2019-12-27 NOTE — Progress Notes (Signed)
Location:    [facility] Room Number: 109/W Place of Service:  SNF (31)   CODE STATUS: DNR  No Known Allergies  Chief Complaint  Patient presents with  . Acute Visit    Care Plan Meeting    HPI:  We have come together for her care plan meeting. BIMS 6/15 mood 0/30. There are no recent falls. Her weight is stable at 105 pounds. There are no reports of pain; no reports of anxiety or agitation. She requires assistance with adls. She continues to be followed for her chronic illnesses including: dementia hypertension; anemia.  Past Medical History:  Diagnosis Date  . Anemia   . Anxiety   . Dementia ([place])   . Dysphagia, oropharyngeal phase   . GERD (gastroesophageal reflux disease)   . Hyperpotassemia   . Hyperpotassemia   . Hypertension   . Other severe protein-calorie malnutrition     Past Surgical History:  Procedure Laterality Date  . ABDOMINAL HYSTERECTOMY    . APPENDECTOMY      Social History   Socioeconomic History  . Marital status: Widowed    Spouse name: Not on file  . Number of children: Not on file  . Years of education: Not on file  . Highest education level: Not on file  Occupational History  . Not on file  Tobacco Use  . Smoking status: Never Smoker  . Smokeless tobacco: Never Used  Substance and Sexual Activity  . Alcohol use: No  . Drug use: No  . Sexual activity: Never  Other Topics Concern  . Not on file  Social History Narrative  . Not on file   Social Determinants of Health   Financial Resource Strain:   . Difficulty of Paying Living Expenses:   Food Insecurity:   . Worried About Charity fundraiser in the Last Year:   . Arboriculturist in the Last Year:   Transportation Needs:   . Film/video editor (Medical):   Marland Kitchen Lack of Transportation (Non-Medical):   Physical Activity:   . Days of Exercise per Week:   . Minutes of Exercise per Session:   Stress:   . Feeling of Stress :   Social Connections:   .  Frequency of Communication with Friends and Family:   . Frequency of Social Gatherings with Friends and Family:   . Attends Religious Services:   . Active Member of Clubs or Organizations:   . Attends Archivist Meetings:   Marland Kitchen Marital Status:   Intimate Partner Violence:   . Fear of Current or Ex-Partner:   . Emotionally Abused:   Marland Kitchen Physically Abused:   . Sexually Abused:    No family history on file.    VITAL SIGNS BP 125/73   Pulse 84   Temp 97.8 F (36.6 C) (Oral)   Resp 18   Ht 5' 5.5" (1.664 m)   Wt 105 lb 12.8 oz (48 kg)   SpO2 97%   BMI 17.34 kg/m   Outpatient Encounter Medications as of 12/27/2019  Medication Sig  . ferrous sulfate 220 (44 Fe) MG/5ML solution Take by mouth. Give 7.4 cc twice a day PRN  . hydrocortisone (ANUSOL-HC) 2.5 % rectal cream Apply 1 application topically 2 (two) times daily as needed for hemorrhoids or anal itching.  . labetalol (NORMODYNE) 200 MG tablet Take 200 mg by mouth 2 (two) times daily. For HTN  . magnesium hydroxide (MILK OF MAGNESIA) 400 MG/5ML suspension Take 30  mLs by mouth daily as needed for mild constipation.  . NON FORMULARY Diet Type:  Mechanical soft Diet  . Nutritional Supplements (ENSURE ENLIVE PO) Take 1 Bottle by mouth 3 (three) times daily with meals.   . sodium polystyrene (KAYEXALATE) 15 GM/60ML suspension 68ml; oral  Special Instructions: Mix with 157ml of water Provided, shake vigorously and immediately prior to administration.MEDICINE IN REFRIGERATOR. Dx: Renal insufficiency with hyperkalemia Once A Day on Sun, Tue, Thu 12:00 PM   No facility-administered encounter medications on file as of 12/27/2019.     SIGNIFICANT DIAGNOSTIC EXAMS  LABS REVIEWED PREVIOUS:   02-19-19: wbc 5.0; hgb 10.0; hct 31.6; mcv 105.3; plt 177; glucose 91; bun 88; creat 1.76; k+ 5.4; na++ 138; ca 11.0; liver normal albumin 3.4; tsh 2.703 02-22-19: glucose 88; bun 81; creat 1.57; k+ 4.8; na++ 139 ca 10.6 08-13-19: wbc 5.0; hgb  9.8; hct 30.2; mcv 104.5 plt 185; glucose 73; bun 52; creat 1.40 k+ 4.1; na++ 140; ca 9.5; liver normal albumin 3.4 iron 55; tibc 230 10-12-19: vit D 72.53  NO NEW LABS.     Review of Systems  Unable to perform ROS: Dementia (unable to participate )   Physical Exam Constitutional:      General: She is not in acute distress.    Appearance: She is well-developed. She is not diaphoretic.  Neck:     Thyroid: No thyromegaly.  Cardiovascular:     Rate and Rhythm: Normal rate and regular rhythm.     Pulses: Normal pulses.     Heart sounds: Normal heart sounds.  Pulmonary:     Effort: Pulmonary effort is normal. No respiratory distress.     Breath sounds: Normal breath sounds.  Abdominal:     General: Bowel sounds are normal. There is no distension.     Palpations: Abdomen is soft.     Tenderness: There is no abdominal tenderness.  Musculoskeletal:        General: Normal range of motion.     Cervical back: Neck supple.     Right lower leg: No edema.     Left lower leg: No edema.  Lymphadenopathy:     Cervical: No cervical adenopathy.  Skin:    General: Skin is warm and dry.  Neurological:     Mental Status: She is alert. Mental status is at baseline.  Psychiatric:        Mood and Affect: Mood normal.       ASSESSMENT/ PLAN:  TODAY;  1. Dementia without behavioral disturbance unspecified dementia type 2. Benign hypertension with chronic kidney disease stage IV 3. Anemia due to chronic kidney disease stage IV  Will continue current medications Will continue current plan of care Will continue to monitor her status.   MD is aware of resident's narcotic use and is in agreement with current plan of care. We will attempt to wean resident as appropriate.  [name] [facility]  Contact [phone] Monday through Friday 8am- 5pm  After hours call [phone]

## 2020-01-16 ENCOUNTER — Encounter: Payer: Self-pay | Admitting: Adult Health

## 2020-01-16 ENCOUNTER — Non-Acute Institutional Stay (SKILLED_NURSING_FACILITY): Payer: Medicare PPO | Admitting: Adult Health

## 2020-01-16 DIAGNOSIS — E559 Vitamin D deficiency, unspecified: Secondary | ICD-10-CM | POA: Diagnosis not present

## 2020-01-16 DIAGNOSIS — E875 Hyperkalemia: Secondary | ICD-10-CM | POA: Diagnosis not present

## 2020-01-16 DIAGNOSIS — K219 Gastro-esophageal reflux disease without esophagitis: Secondary | ICD-10-CM | POA: Diagnosis not present

## 2020-01-16 NOTE — Progress Notes (Signed)
Location:    [place] Room Number: 109/W Place of Service:  SNF (31)   CODE STATUS: DNR  No Known Allergies  Chief Complaint  Patient presents with  . Medical Management of Chronic Issues           GERD without esophagitis:    Vit D deficiency:   Chronic hyperkalemia:     HPI:  She is a 86 year old long term resident of this facility being seen for the management of her chronic illnesses: gerd; vit d def; hyperkalemia. Her weight is stable no reports of agitation or anxiety; no reports of uncontrolled pain.   Past Medical History:  Diagnosis Date  . Anemia   . Anxiety   . Dementia ([place])   . Dysphagia, oropharyngeal phase   . GERD (gastroesophageal reflux disease)   . Hyperpotassemia   . Hyperpotassemia   . Hypertension   . Other severe protein-calorie malnutrition     Past Surgical History:  Procedure Laterality Date  . ABDOMINAL HYSTERECTOMY    . APPENDECTOMY      Social History   Socioeconomic History  . Marital status: Widowed    Spouse name: Not on file  . Number of children: Not on file  . Years of education: Not on file  . Highest education level: Not on file  Occupational History  . Not on file  Tobacco Use  . Smoking status: Never Smoker  . Smokeless tobacco: Never Used  Substance and Sexual Activity  . Alcohol use: No  . Drug use: No  . Sexual activity: Never  Other Topics Concern  . Not on file  Social History Narrative  . Not on file   Social Determinants of Health   Financial Resource Strain:   . Difficulty of Paying Living Expenses:   Food Insecurity:   . Worried About Charity fundraiser in the Last Year:   . Arboriculturist in the Last Year:   Transportation Needs:   . Film/video editor (Medical):   Marland Kitchen Lack of Transportation (Non-Medical):   Physical Activity:   . Days of Exercise per Week:   . Minutes of Exercise per Session:   Stress:   . Feeling of Stress :   Social Connections:   . Frequency  of Communication with Friends and Family:   . Frequency of Social Gatherings with Friends and Family:   . Attends Religious Services:   . Active Member of Clubs or Organizations:   . Attends Archivist Meetings:   Marland Kitchen Marital Status:   Intimate Partner Violence:   . Fear of Current or Ex-Partner:   . Emotionally Abused:   Marland Kitchen Physically Abused:   . Sexually Abused:    No family history on file.    VITAL SIGNS BP 123/70   Pulse 78   Temp (!) 97.3 F (36.3 C) (Oral)   Resp 18   Ht 5' 5.5" (1.664 m)   Wt 107 lb (48.5 kg)   SpO2 97%   BMI 17.53 kg/m   Outpatient Encounter Medications as of 01/16/2020  Medication Sig  . ferrous sulfate 220 (44 Fe) MG/5ML solution Take by mouth daily with breakfast. 7.4 cc  . hydrocortisone (ANUSOL-HC) 2.5 % rectal cream Apply 1 application topically 2 (two) times daily as needed for hemorrhoids or anal itching.  . labetalol (NORMODYNE) 200 MG tablet Take 200 mg by mouth 2 (two) times daily. For HTN  . magnesium hydroxide (MILK OF MAGNESIA)  400 MG/5ML suspension Take 30 mLs by mouth daily as needed for mild constipation.  . NON FORMULARY Diet Type:  Mechanical soft Diet  . Nutritional Supplements (ENSURE ENLIVE PO) Take 1 Bottle by mouth 3 (three) times daily with meals.   . sodium polystyrene (KAYEXALATE) 15 GM/60ML suspension 19ml; oral  Special Instructions: Mix with 117ml of water Provided, shake vigorously and immediately prior to administration.MEDICINE IN REFRIGERATOR. Dx: Renal insufficiency with hyperkalemia Once A Day on Sun, Tue, Thu 12:00 PM   No facility-administered encounter medications on file as of 01/16/2020.     SIGNIFICANT DIAGNOSTIC EXAMS   LABS REVIEWED PREVIOUS:   02-19-19: wbc 5.0; hgb 10.0; hct 31.6; mcv 105.3; plt 177; glucose 91; bun 88; creat 1.76; k+ 5.4; na++ 138; ca 11.0; liver normal albumin 3.4; tsh 2.703 02-22-19: glucose 88; bun 81; creat 1.57; k+ 4.8; na++ 139 ca 10.6 08-13-19: wbc 5.0; hgb 9.8; hct  30.2; mcv 104.5 plt 185; glucose 73; bun 52; creat 1.40 k+ 4.1; na++ 140; ca 9.5; liver normal albumin 3.4 iron 55; tibc 230 10-12-19: vit D 72.53   NO NEW LABS.    Review of Systems  Unable to perform ROS: Dementia (unable to participate )   Physical Exam Constitutional:      General: She is not in acute distress.    Appearance: She is well-developed. She is not diaphoretic.  Neck:     Thyroid: No thyromegaly.  Cardiovascular:     Rate and Rhythm: Normal rate and regular rhythm.     Pulses: Normal pulses.     Heart sounds: Normal heart sounds.  Pulmonary:     Effort: Pulmonary effort is normal. No respiratory distress.     Breath sounds: Normal breath sounds.  Abdominal:     General: Bowel sounds are normal. There is no distension.     Palpations: Abdomen is soft.     Tenderness: There is no abdominal tenderness.  Musculoskeletal:        General: Normal range of motion.     Cervical back: Neck supple.     Right lower leg: No edema.     Left lower leg: No edema.  Lymphadenopathy:     Cervical: No cervical adenopathy.  Skin:    General: Skin is warm and dry.  Neurological:     Mental Status: She is alert. Mental status is at baseline.  Psychiatric:        Mood and Affect: Mood normal.      ASSESSMENT/ PLAN:  TODAY:   1. GERD without esophagitis: is stable will monitor   2. Vit D deficiency: is stable will monitor   3. Chronic hyperkalemia: is stable k+ 5.2 will continue kayexalate 15 gm three times weekly    PREVIOUS   4. Benign hypertension with chronic renal disease stage IV: is stable b/p 103/65 will continue labetolol 200 mg twice daily and will monitor  5. Age related osteoporosis: without current pathological fracture: is stable will continue to monitor    6. Anemia due to stage 4 chronic kidney disease: is stable hgb 9.8 will monitor   7. Non-intentional weight loss: is slowly losing weight; which is an unfortunate outcome in the late stages of  dementia. Her current weight is 107 pounds.   8. Unspecified type dementia without behavioral disturbance: is without significant change weight is 107 pounds; will not make changes will monitor   9. Stage 4 chronic kidney disease: is stable bun 52; creat 1.40 will monitor  MD is aware of resident's narcotic use and is in agreement with current plan of care. We will attempt to wean resident as appropriate.  Kathlean Cinco NP [facility]  Contact [phone] Monday through Friday 8am- 5pm  After hours call [phone]   

## 2020-01-18 DIAGNOSIS — E559 Vitamin D deficiency, unspecified: Secondary | ICD-10-CM | POA: Insufficient documentation

## 2020-02-14 ENCOUNTER — Encounter: Payer: Self-pay | Admitting: Adult Health

## 2020-02-14 ENCOUNTER — Non-Acute Institutional Stay (SKILLED_NURSING_FACILITY): Payer: Medicare PPO | Admitting: Adult Health

## 2020-02-14 DIAGNOSIS — N184 Chronic kidney disease, stage 4 (severe): Secondary | ICD-10-CM

## 2020-02-14 DIAGNOSIS — M810 Age-related osteoporosis without current pathological fracture: Secondary | ICD-10-CM

## 2020-02-14 DIAGNOSIS — I129 Hypertensive chronic kidney disease with stage 1 through stage 4 chronic kidney disease, or unspecified chronic kidney disease: Secondary | ICD-10-CM | POA: Diagnosis not present

## 2020-02-14 DIAGNOSIS — D631 Anemia in chronic kidney disease: Secondary | ICD-10-CM

## 2020-02-14 NOTE — Progress Notes (Signed)
Location:    [place] Room Number: 109/W Place of Service:  SNF (31)   CODE STATUS: DNR  No Known Allergies  Chief Complaint  Patient presents with  . Medical Management of Chronic Issues        Benign hypertension with chronic renal disease stage IV:  Age related osteoporosis without current pathological fracture Anemia due to stage 4 chronic kidney disease:    HPI:  She is a 86 year old long term resident of this facility being seen for the management of her chronic illnesses: hypertension; osteoporosis anemia. There are no reports of uncontrolled pain; no changes in her appetite; her weight is stable. No reports agitation or anxiety.   Past Medical History:  Diagnosis Date  . Anemia   . Anxiety   . Dementia ([name])   . Dysphagia, oropharyngeal phase   . GERD (gastroesophageal reflux disease)   . Hyperpotassemia   . Hyperpotassemia   . Hypertension   . Other severe protein-calorie malnutrition     Past Surgical History:  Procedure Laterality Date  . ABDOMINAL HYSTERECTOMY    . APPENDECTOMY      Social History   Socioeconomic History  . Marital status: Widowed    Spouse name: Not on file  . Number of children: Not on file  . Years of education: Not on file  . Highest education level: Not on file  Occupational History  . Not on file  Tobacco Use  . Smoking status: Never Smoker  . Smokeless tobacco: Never Used  Substance and Sexual Activity  . Alcohol use: No  . Drug use: No  . Sexual activity: Never  Other Topics Concern  . Not on file  Social History Narrative  . Not on file   Social Determinants of Health   Financial Resource Strain:   . Difficulty of Paying Living Expenses:   Food Insecurity:   . Worried About Charity fundraiser in the Last Year:   . Arboriculturist in the Last Year:   Transportation Needs:   . Film/video editor (Medical):   Marland Kitchen Lack of Transportation (Non-Medical):   Physical Activity:   . Days of  Exercise per Week:   . Minutes of Exercise per Session:   Stress:   . Feeling of Stress :   Social Connections:   . Frequency of Communication with Friends and Family:   . Frequency of Social Gatherings with Friends and Family:   . Attends Religious Services:   . Active Member of Clubs or Organizations:   . Attends Archivist Meetings:   Marland Kitchen Marital Status:   Intimate Partner Violence:   . Fear of Current or Ex-Partner:   . Emotionally Abused:   Marland Kitchen Physically Abused:   . Sexually Abused:    No family history on file.    VITAL SIGNS BP 117/69   Pulse 80   Temp 98.1 F (36.7 C) (Oral)   Ht 5' 5.5" (1.664 m)   Wt 109 lb 6.4 oz (49.6 kg)   BMI 17.93 kg/m   Outpatient Encounter Medications as of 02/14/2020  Medication Sig  . ferrous sulfate 220 (44 Fe) MG/5ML solution Take by mouth daily with breakfast. 7.4 cc  . hydrocortisone (ANUSOL-HC) 2.5 % rectal cream Apply 1 application topically 2 (two) times daily as needed for hemorrhoids or anal itching.  . labetalol (NORMODYNE) 200 MG tablet Take 200 mg by mouth 2 (two) times daily. For HTN  . magnesium  hydroxide (MILK OF MAGNESIA) 400 MG/5ML suspension Take 30 mLs by mouth daily as needed for mild constipation.  . NON FORMULARY Diet Type:  Mechanical soft Diet  . Nutritional Supplements (ENSURE ENLIVE PO) Take 1 Bottle by mouth 3 (three) times daily with meals.   . sodium polystyrene (KAYEXALATE) 15 GM/60ML suspension 62ml; oral  Special Instructions: Mix with 170ml of water Provided, shake vigorously and immediately prior to administration.MEDICINE IN REFRIGERATOR. Dx: Renal insufficiency with hyperkalemia Once A Day on Sun, Tue, Thu 12:00 PM   No facility-administered encounter medications on file as of 02/14/2020.     SIGNIFICANT DIAGNOSTIC EXAMS   LABS REVIEWED PREVIOUS:   02-19-19: wbc 5.0; hgb 10.0; hct 31.6; mcv 105.3; plt 177; glucose 91; bun 88; creat 1.76; k+ 5.4; na++ 138; ca 11.0; liver normal albumin 3.4;  tsh 2.703 02-22-19: glucose 88; bun 81; creat 1.57; k+ 4.8; na++ 139 ca 10.6 08-13-19: wbc 5.0; hgb 9.8; hct 30.2; mcv 104.5 plt 185; glucose 73; bun 52; creat 1.40 k+ 4.1; na++ 140; ca 9.5; liver normal albumin 3.4 iron 55; tibc 230 10-12-19: vit D 72.53   NO NEW LABS.    Review of Systems  Unable to perform ROS: Dementia ([place] to participate )   Physical Exam Constitutional:      General: She is not in acute distress.    Appearance: She is well-developed. She is not diaphoretic.  Neck:     Thyroid: No thyromegaly.  Cardiovascular:     Rate and Rhythm: Normal rate and regular rhythm.     Pulses: Normal pulses.     Heart sounds: Normal heart sounds.  Pulmonary:     Effort: Pulmonary effort is normal. No respiratory distress.     Breath sounds: Normal breath sounds.  Abdominal:     General: Bowel sounds are normal. There is no distension.     Palpations: Abdomen is soft.     Tenderness: There is no abdominal tenderness.  Musculoskeletal:        General: Normal range of motion.     Cervical back: Neck supple.     Right lower leg: No edema.     Left lower leg: No edema.  Lymphadenopathy:     Cervical: No cervical adenopathy.  Skin:    General: Skin is warm and dry.  Neurological:     Mental Status: She is alert. Mental status is at baseline.  Psychiatric:        Mood and Affect: Mood normal.      ASSESSMENT/ PLAN:  TODAY:   1. Benign hypertension with chronic renal disease stage IV: is stable b/p 117/69 will continue labetalol 200 mg twice daily and will monitor   2. Age related osteoporosis without current pathological fracture is stable will monitor   3. Anemia due to stage 4 chronic kidney disease: is stable hgb 9.9 will monitor     PREVIOUS   4. Non-intentional weight loss: is slowly losing weight; which is an unfortunate outcome in the late stages of dementia. Her current weight is 109 pounds.   5. Unspecified type dementia without behavioral disturbance:  is without significant change weight is 109 pounds; will not make changes will monitor   6. Stage 4 chronic kidney disease: is stable bun 52; creat 1.40 will monitor   7. GERD without esophagitis: is stable will monitor   8. Vit D deficiency: is stable will monitor   9. Chronic hyperkalemia: is stable k+ 5.2 will continue kayexalate 15 gm three times  weekly   Will check cbc; cmp         MD is aware of resident's narcotic use and is in agreement with current plan of care. We will attempt to wean resident as appropriate.  [name] [facility]  Contact [phone] Monday through Friday 8am- 5pm  After hours call [phone]

## 2020-02-18 ENCOUNTER — Encounter (HOSPITAL_COMMUNITY)
Admission: RE | Admit: 2020-02-18 | Discharge: 2020-02-18 | Disposition: A | Discharge status: Home / Self Care | Payer: Medicare PPO | Source: Skilled Nursing Facility | Attending: Adult Health | Admitting: Adult Health

## 2020-02-18 DIAGNOSIS — I129 Hypertensive chronic kidney disease with stage 1 through stage 4 chronic kidney disease, or unspecified chronic kidney disease: Secondary | ICD-10-CM | POA: Diagnosis not present

## 2020-02-18 LAB — COMPREHENSIVE METABOLIC PANEL
ALT: 18 U/L (ref 0–44)
AST: 24 U/L (ref 15–41)
Albumin: 3.5 g/dL (ref 3.5–5.0)
Alkaline Phosphatase: 62 U/L (ref 38–126)
Anion gap: 13 (ref 5–15)
BUN: 74 mg/dL — ABNORMAL HIGH (ref 8–23)
CO2: 28 mmol/L (ref 22–32)
Calcium: 10.6 mg/dL — ABNORMAL HIGH (ref 8.9–10.3)
Chloride: 100 mmol/L (ref 98–111)
Creatinine, Ser: 1.3 mg/dL — ABNORMAL HIGH (ref 0.44–1.00)
GFR calc Af Amer: 40 mL/min — ABNORMAL LOW (ref >60)
GFR calc non Af Amer: 35 mL/min — ABNORMAL LOW (ref >60)
Glucose, Bld: 90 mg/dL (ref 70–99)
Potassium: 4.3 mmol/L (ref 3.5–5.1)
Sodium: 141 mmol/L (ref 135–145)
Total Bilirubin: 0.6 mg/dL (ref 0.3–1.2)
Total Protein: 6.9 g/dL (ref 6.5–8.1)

## 2020-02-18 LAB — CBC
HCT: 31.2 % — ABNORMAL LOW (ref 36.0–46.0)
Hemoglobin: 9.7 g/dL — ABNORMAL LOW (ref 12.0–15.0)
MCH: 33.8 pg (ref 26.0–34.0)
MCHC: 31.1 g/dL (ref 30.0–36.0)
MCV: 108.7 fL — ABNORMAL HIGH (ref 80.0–100.0)
Platelets: 174 10*3/uL (ref 150–400)
RBC: 2.87 MIL/uL — ABNORMAL LOW (ref 3.87–5.11)
RDW: 12.7 % (ref 11.5–15.5)
WBC: 6.2 10*3/uL (ref 4.0–10.5)
nRBC: 0 % (ref 0.0–0.2)

## 2020-02-18 LAB — TSH: TSH: 2.712 u[IU]/mL (ref 0.350–4.500)

## 2020-02-18 LAB — VITAMIN D 25 HYDROXY (VIT D DEFICIENCY, FRACTURES): Vit D, 25-Hydroxy: 56.74 ng/mL (ref 30–100)

## 2020-03-17 ENCOUNTER — Encounter: Payer: Self-pay | Admitting: Adult Health

## 2020-03-17 ENCOUNTER — Non-Acute Institutional Stay (SKILLED_NURSING_FACILITY): Payer: Medicare PPO | Admitting: Adult Health

## 2020-03-17 DIAGNOSIS — N184 Chronic kidney disease, stage 4 (severe): Secondary | ICD-10-CM

## 2020-03-17 DIAGNOSIS — F0390 Unspecified dementia without behavioral disturbance: Secondary | ICD-10-CM | POA: Diagnosis not present

## 2020-03-17 DIAGNOSIS — R634 Abnormal weight loss: Secondary | ICD-10-CM | POA: Diagnosis not present

## 2020-03-17 NOTE — Progress Notes (Signed)
Location:    [place] Room Number: 109/W Place of Service:  SNF (31)   CODE STATUS: DNR  No Known Allergies  Chief Complaint  Patient presents with  . Medical Management of Chronic Issues         Non-intentional weight loss  Dementia without behavioral disturbance unspecified dementia type:   Stage 4 chronic kidney disease    HPI:  She is a 86 year old long term resident of this facility being seen for the management of her chronic illnesses:Non-intentional weight loss  Dementia without behavioral disturbance unspecified dementia type: Stage 4 chronic kidney disease. There are no reports of uncontrolled pain; her weight is stable.  No reports anxiety or agitation; no reports of constipation or heart burn ,  Past Medical History:  Diagnosis Date  . Anemia   . Anxiety   . Dementia ([place])   . Dysphagia, oropharyngeal phase   . GERD (gastroesophageal reflux disease)   . Hyperpotassemia   . Hyperpotassemia   . Hypertension   . Other severe protein-calorie malnutrition     Past Surgical History:  Procedure Laterality Date  . ABDOMINAL HYSTERECTOMY    . APPENDECTOMY      Social History   Socioeconomic History  . Marital status: Widowed    Spouse name: Not on file  . Number of children: Not on file  . Years of education: Not on file  . Highest education level: Not on file  Occupational History  . Not on file  Tobacco Use  . Smoking status: Never Smoker  . Smokeless tobacco: Never Used  Substance and Sexual Activity  . Alcohol use: No  . Drug use: No  . Sexual activity: Never  Other Topics Concern  . Not on file  Social History Narrative  . Not on file   Social Determinants of Health   Financial Resource Strain:   . Difficulty of Paying Living Expenses:   Food Insecurity:   . Worried About Charity fundraiser in the Last Year:   . Arboriculturist in the Last Year:   Transportation Needs:   . Film/video editor (Medical):   Marland Kitchen  Lack of Transportation (Non-Medical):   Physical Activity:   . Days of Exercise per Week:   . Minutes of Exercise per Session:   Stress:   . Feeling of Stress :   Social Connections:   . Frequency of Communication with Friends and Family:   . Frequency of Social Gatherings with Friends and Family:   . Attends Religious Services:   . Active Member of Clubs or Organizations:   . Attends Archivist Meetings:   Marland Kitchen Marital Status:   Intimate Partner Violence:   . Fear of Current or Ex-Partner:   . Emotionally Abused:   Marland Kitchen Physically Abused:   . Sexually Abused:    No family history on file.    VITAL SIGNS BP (!) 130/80   Pulse 72   Temp 98.1 F (36.7 C) (Oral)   Ht 5' 5.5" (1.664 m)   Wt 109 lb 6.4 oz (49.6 kg)   SpO2 97%   BMI 17.93 kg/m   Outpatient Encounter Medications as of 03/17/2020  Medication Sig  . ferrous sulfate 220 (44 Fe) MG/5ML solution Take by mouth daily with breakfast. 7.4 cc  . hydrocortisone (ANUSOL-HC) 2.5 % rectal cream Apply 1 application topically 2 (two) times daily as needed for hemorrhoids or anal itching.  . labetalol (NORMODYNE) 200 MG  tablet Take 200 mg by mouth 2 (two) times daily. For HTN  . magnesium hydroxide (MILK OF MAGNESIA) 400 MG/5ML suspension Take 30 mLs by mouth daily as needed for mild constipation.  . NON FORMULARY Diet Type:  Mechanical soft Diet  . Nutritional Supplements (ENSURE ENLIVE PO) Take 1 Bottle by mouth 3 (three) times daily with meals.   . sodium polystyrene (KAYEXALATE) 15 GM/60ML suspension 85ml; oral  Special Instructions: Mix with 18ml of water Provided, shake vigorously and immediately prior to administration.MEDICINE IN REFRIGERATOR. Dx: Renal insufficiency with hyperkalemia Once A Day on Sun, Tue, Thu 12:00 PM   No facility-administered encounter medications on file as of 03/17/2020.     SIGNIFICANT DIAGNOSTIC EXAMS   LABS REVIEWED PREVIOUS:   08-13-19: wbc 5.0; hgb 9.8; hct 30.2; mcv 104.5 plt  185; glucose 73; bun 52; creat 1.40 k+ 4.1; na++ 140; ca 9.5; liver normal albumin 3.4 iron 55; tibc 230 10-12-19: vit D 72.53   TODAY  02-18-20: wbc 6.2; hgb 9.7; hct 31.2; mcv 108.7 plt 174; glucose 09; bun 74; creat 1.30 k+ 4.3; na++ 141; ca 10.6; liver normal albumin 3.5 tsh 2.712; vit D 56.74      Review of Systems  Unable to perform ROS: Dementia (unable to participate )   Physical Exam Constitutional:      General: She is not in acute distress.    Appearance: She is well-developed. She is not diaphoretic.  Neck:     Thyroid: No thyromegaly.  Cardiovascular:     Rate and Rhythm: Normal rate and regular rhythm.     Pulses: Normal pulses.     Heart sounds: Normal heart sounds.  Pulmonary:     Effort: Pulmonary effort is normal. No respiratory distress.     Breath sounds: Normal breath sounds.  Abdominal:     General: Bowel sounds are normal. There is no distension.     Palpations: Abdomen is soft.     Tenderness: There is no abdominal tenderness.  Musculoskeletal:        General: Normal range of motion.     Cervical back: Neck supple.     Right lower leg: No edema.     Left lower leg: No edema.  Lymphadenopathy:     Cervical: No cervical adenopathy.  Skin:    General: Skin is warm and dry.  Neurological:     Mental Status: She is alert and oriented to person, place, and time.  Psychiatric:        Mood and Affect: Mood normal.       ASSESSMENT/ PLAN:  TODAY:   1. Non-intentional weight loss is without change she is slowly losing weight which is an expected unfortunate outcome in the lat stages of dementia; her weight is 109 pounds   2. Dementia without behavioral disturbance unspecified dementia type: no significant change weight is 109 pounds will monitor   3. Stage 4 chronic kidney disease: is stable bun 74; creat 1.30 will monitor    PREVIOUS    4. GERD without esophagitis: is stable will monitor   5 Vit D deficiency: is stable D 56.74 will monitor    6. Chronic hyperkalemia: is stable k+ 4.3 will continue kayexalate 15 gm three times weekly   7. Benign hypertension with chronic renal disease stage IV: is stable b/p 130/80 will continue labetalol 200 mg twice daily and will monitor   8. Age related osteoporosis without current pathological fracture is stable will monitor   9. Anemia due  to stage 4 chronic kidney disease: is stable hgb 9.7 will monitor        MD is aware of resident's narcotic use and is in agreement with current plan of care. We will attempt to wean resident as appropriate.  [name] [facility]  Contact [phone] Monday through Friday 8am- 5pm  After hours call [phone]

## 2020-03-27 ENCOUNTER — Encounter: Payer: Self-pay | Admitting: Adult Health

## 2020-03-27 ENCOUNTER — Non-Acute Institutional Stay (SKILLED_NURSING_FACILITY): Payer: Medicare PPO | Admitting: Adult Health

## 2020-03-27 DIAGNOSIS — D631 Anemia in chronic kidney disease: Secondary | ICD-10-CM | POA: Diagnosis not present

## 2020-03-27 DIAGNOSIS — I129 Hypertensive chronic kidney disease with stage 1 through stage 4 chronic kidney disease, or unspecified chronic kidney disease: Secondary | ICD-10-CM

## 2020-03-27 DIAGNOSIS — N184 Chronic kidney disease, stage 4 (severe): Secondary | ICD-10-CM

## 2020-03-27 DIAGNOSIS — F0390 Unspecified dementia without behavioral disturbance: Secondary | ICD-10-CM

## 2020-03-27 NOTE — Progress Notes (Signed)
Location:    [place] Room Number: 109/W Place of Service:  SNF (31)   CODE STATUS: DNR  No Known Allergies  Chief Complaint  Patient presents with   Acute Visit    Care Plan Meeting    HPI:  We have come together for her care plan meeting. BIMS 6/15 mood 6/30. There are no reports of falls; her weight is stable. She does have a poor appetite; is a slow eater. She requires limited to extensive assist with adls. Is incontinent of bladder and bowel. There are no reports of uncontrolled pain; no reports of constipation. No reports of insomnia. She continues to be followed for her chronic illnesses including: Dementia without behavioral disturbance unspecified dementia type   Benign hypertension with chronic kidney disease stage IV Anemia due to stage 4 chronic kidney disease      Past Medical History:  Diagnosis Date   Anemia    Anxiety    Dementia (HCC)    Dysphagia, oropharyngeal phase    GERD (gastroesophageal reflux disease)    Hyperpotassemia    Hyperpotassemia    Hypertension    Other severe protein-calorie malnutrition     Past Surgical History:  Procedure Laterality Date   ABDOMINAL HYSTERECTOMY     APPENDECTOMY      Social History   Socioeconomic History   Marital status: Widowed    Spouse name: Not on file   Number of children: Not on file   Years of education: Not on file   Highest education level: Not on file  Occupational History   Not on file  Tobacco Use   Smoking status: Never Smoker   Smokeless tobacco: Never Used  Substance and Sexual Activity   Alcohol use: No   Drug use: No   Sexual activity: Never  Other Topics Concern   Not on file  Social History Narrative   Not on file   Social Determinants of Health   Financial Resource Strain:    Difficulty of Paying Living Expenses:   Food Insecurity:    Worried About Charity fundraiser in the Last Year:    Arboriculturist in the Last  Year:   Transportation Needs:    Film/video editor (Medical):    Lack of Transportation (Non-Medical):   Physical Activity:    Days of Exercise per Week:    Minutes of Exercise per Session:   Stress:    Feeling of Stress :   Social Connections:    Frequency of Communication with Friends and Family:    Frequency of Social Gatherings with Friends and Family:    Attends Religious Services:    Active Member of Clubs or Organizations:    Attends Music therapist:    Marital Status:   Intimate Partner Violence:    Fear of Current or Ex-Partner:    Emotionally Abused:    Physically Abused:    Sexually Abused:    No family history on file.    VITAL SIGNS BP 104/63    Pulse 72    Temp 98.1 F (36.7 C) (Oral)    Resp 16    Ht 5' 5.5" (1.664 m)    Wt 110 lb 9.6 oz (50.2 kg)    BMI 18.12 kg/m   Outpatient Encounter Medications as of 03/27/2020  Medication Sig   ferrous sulfate 220 (44 Fe) MG/5ML solution Take by mouth daily with breakfast. 7.4 cc   hydrocortisone (ANUSOL-HC) 2.5 % rectal  cream Apply 1 application topically 2 (two) times daily as needed for hemorrhoids or anal itching.   labetalol (NORMODYNE) 200 MG tablet Take 200 mg by mouth 2 (two) times daily. For HTN   magnesium hydroxide (MILK OF MAGNESIA) 400 MG/5ML suspension Take 30 mLs by mouth daily as needed for mild constipation.   NON FORMULARY Diet Type:  Mechanical soft Diet   Nutritional Supplements (ENSURE ENLIVE PO) Take 1 Bottle by mouth 3 (three) times daily with meals.    sodium polystyrene (KAYEXALATE) 15 GM/60ML suspension 62ml; oral  Special Instructions: Mix with 171ml of water Provided, shake vigorously and immediately prior to administration.MEDICINE IN REFRIGERATOR. Dx: Renal insufficiency with hyperkalemia Once A Day on Sun, Tue, Thu 12:00 PM   No facility-administered encounter medications on file as of 03/27/2020.     SIGNIFICANT DIAGNOSTIC EXAMS   LABS  REVIEWED PREVIOUS:   08-13-19: wbc 5.0; hgb 9.8; hct 30.2; mcv 104.5 plt 185; glucose 73; bun 52; creat 1.40 k+ 4.1; na++ 140; ca 9.5; liver normal albumin 3.4 iron 55; tibc 230 10-12-19: vit D 72.53  02-18-20: wbc 6.2; hgb 9.7; hct 31.2; mcv 108.7 plt 174; glucose 09; bun 74; creat 1.30 k+ 4.3; na++ 141; ca 10.6; liver normal albumin 3.5 tsh 2.712; vit D 56.74   NO NEW LABS.   Review of Systems  Unable to perform ROS: Dementia (unable to participate )    Physical Exam Constitutional:      General: She is not in acute distress.    Appearance: She is well-developed. She is not diaphoretic.  Neck:     Thyroid: No thyromegaly.  Cardiovascular:     Rate and Rhythm: Normal rate and regular rhythm.     Pulses: Normal pulses.     Heart sounds: Normal heart sounds.  Pulmonary:     Effort: Pulmonary effort is normal. No respiratory distress.     Breath sounds: Normal breath sounds.  Abdominal:     General: Bowel sounds are normal. There is no distension.     Palpations: Abdomen is soft.     Tenderness: There is no abdominal tenderness.  Musculoskeletal:        General: Normal range of motion.     Cervical back: Neck supple.     Right lower leg: No edema.     Left lower leg: No edema.  Lymphadenopathy:     Cervical: No cervical adenopathy.  Skin:    General: Skin is warm and dry.  Neurological:     Mental Status: She is alert. Mental status is at baseline.  Psychiatric:        Mood and Affect: Mood normal.       ASSESSMENT/ PLAN:  TODAY  1. Dementia without behavioral disturbance unspecified dementia type 2. Benign hypertension with chronic kidney disease stage IV 3. Anemia due to stage 4 chronic kidney disease   Will continue current medications Will continue current plan of care Will continue to monitor her status.   MD is aware of resident's narcotic use and is in agreement with current plan of care. We will attempt to wean resident as appropriate.  Ok Edwards  NP [facility]  Contact [phone] Monday through Friday 8am- 5pm  After hours call [phone]

## 2020-04-23 ENCOUNTER — Encounter: Payer: Self-pay | Admitting: Adult Health

## 2020-04-23 ENCOUNTER — Non-Acute Institutional Stay (SKILLED_NURSING_FACILITY): Payer: Medicare PPO | Admitting: Adult Health

## 2020-04-23 DIAGNOSIS — M810 Age-related osteoporosis without current pathological fracture: Secondary | ICD-10-CM | POA: Diagnosis not present

## 2020-04-23 DIAGNOSIS — N184 Chronic kidney disease, stage 4 (severe): Secondary | ICD-10-CM | POA: Diagnosis not present

## 2020-04-23 DIAGNOSIS — R634 Abnormal weight loss: Secondary | ICD-10-CM

## 2020-04-23 DIAGNOSIS — I129 Hypertensive chronic kidney disease with stage 1 through stage 4 chronic kidney disease, or unspecified chronic kidney disease: Secondary | ICD-10-CM

## 2020-04-23 DIAGNOSIS — D631 Anemia in chronic kidney disease: Secondary | ICD-10-CM

## 2020-04-23 DIAGNOSIS — F0390 Unspecified dementia without behavioral disturbance: Secondary | ICD-10-CM | POA: Diagnosis not present

## 2020-04-23 NOTE — Progress Notes (Signed)
Provider: Phillips Grout NP  Location   [facility]  PCP: Gerlene Fee, NP   Extended Emergency Contact Information Primary Emergency Contact: [name] Address: [address]          [place], [place] [address] [place] Phone: [phone] Relation: Other Secondary Emergency Contact: [name], [place] [address] [name] of [place] Phone: [phone] Mobile Phone: [phone] Relation: Niece  Codes status: DNR Goals of care: advanced directive information Advanced Directives 04/23/2020  Does Patient Have a Medical Advance Directive? Yes  Type of Advance Directive Out of facility DNR (pink MOST or yellow form)  Does patient want to make changes to medical advance directive? No - Patient declined  Copy of [place] in Chart? -  Would patient like information on creating a medical advance directive? -  Pre-existing out of facility DNR order (yellow form or pink MOST form) Yellow form placed in chart (order not valid for inpatient use)     No Known Allergies  Chief Complaint  Patient presents with  . Annual Exam    Annual Physical    HPI  She is a 86 year old woman being seen for her annual exam. She has not been hospitalized over the past year. Her weight has not had a significant change. No recent falls. There are no reports of pain; no reports of heart burn; no reports of agitation or anxiety. She continues to be followed for her chronic illnesses including: dementia; hypertension; chronic renal disease.     Past Medical History:  Diagnosis Date  . Anemia   . Anxiety   . Dementia ([place])   . Dysphagia, oropharyngeal phase   . GERD (gastroesophageal reflux disease)   . Hyperpotassemia   . Hyperpotassemia   . Hypertension   . Other severe protein-calorie malnutrition    Past Surgical History:  Procedure Laterality Date  . ABDOMINAL HYSTERECTOMY    . APPENDECTOMY      reports that  she has never smoked. She has never used smokeless tobacco. She reports that she does not drink alcohol and does not use drugs. Social History   Tobacco Use  . Smoking status: Never Smoker  . Smokeless tobacco: Never Used  Substance Use Topics  . Alcohol use: No  . Drug use: No   No family history on file.  Pertinent  Health Maintenance Due  Topic Date Due  . INFLUENZA VACCINE  03/16/2020  . DEXA SCAN  Completed  . PNA vac Low Risk Adult  Completed   Fall Risk  11/08/2019 05/16/2018 04/25/2017  Falls in the past year? 0 No No  Risk for fall due to : Impaired balance/gait;Impaired mobility - -   Depression screen [facility] 2/9 05/16/2018 04/25/2017  Decreased Interest 0 0  Down, Depressed, Hopeless 0 0  PHQ - 2 Score 0 0    Functional Status Survey:    Outpatient Encounter Medications as of 04/23/2020  Medication Sig  . ferrous sulfate 220 (44 Fe) MG/5ML solution Take by mouth daily with breakfast. 7.4 cc  . hydrocortisone (ANUSOL-HC) 2.5 % rectal cream Apply 1 application topically 2 (two) times daily as needed for hemorrhoids or anal itching.  . labetalol (NORMODYNE) 200 MG tablet Take 200 mg by mouth 2 (two) times daily. For HTN  . magnesium hydroxide (MILK OF MAGNESIA) 400 MG/5ML suspension Take 30 mLs by mouth daily as needed for mild constipation.  Marland Kitchen  NON FORMULARY Diet Type:  Mechanical soft Diet  . Nutritional Supplements (ENSURE ENLIVE PO) Take 1 Bottle by mouth 3 (three) times daily with meals.   . sodium polystyrene (KAYEXALATE) 15 GM/60ML suspension 41ml; oral  Special Instructions: Mix with 141ml of water Provided, shake vigorously and immediately prior to administration.MEDICINE IN REFRIGERATOR. Dx: Renal insufficiency with hyperkalemia Once A Day on Sun, Tue, Thu 12:00 PM   No facility-administered encounter medications on file as of 04/23/2020.     Vitals:   04/23/20 0836  BP: 123/75  Pulse: 75  Resp: 18  Temp: 98.1 F (36.7 C)  TempSrc: Oral  SpO2: 97%   Weight: 110 lb 3.2 oz (50 kg)  Height: 5' 5.5" (1.664 m)   Body mass index is 18.06 kg/m.  DIAGNOSTIC EXAMS   LABS REVIEWED PREVIOUS:   08-13-19: wbc 5.0; hgb 9.8; hct 30.2; mcv 104.5 plt 185; glucose 73; bun 52; creat 1.40 k+ 4.1; na++ 140; ca 9.5; liver normal albumin 3.4 iron 55; tibc 230 10-12-19: vit D 72.53  02-18-20: wbc 6.2; hgb 9.7; hct 31.2; mcv 108.7 plt 174; glucose 09; bun 74; creat 1.30 k+ 4.3; na++ 141; ca 10.6; liver normal albumin 3.5 tsh 2.712; vit D 56.74   NO NEW LABS.   Review of Systems  Unable to perform ROS: Dementia (unable to participate )    Physical Exam Constitutional:      General: She is not in acute distress.    Appearance: She is well-developed. She is not diaphoretic.  HENT:     Nose: Nose normal.     Mouth/Throat:     Mouth: Mucous membranes are moist.     Pharynx: Oropharynx is clear.  Eyes:     Conjunctiva/sclera: Conjunctivae normal.  Neck:     Thyroid: No thyromegaly.  Cardiovascular:     Rate and Rhythm: Normal rate and regular rhythm.     Pulses: Normal pulses.     Heart sounds: Normal heart sounds.  Pulmonary:     Effort: Pulmonary effort is normal. No respiratory distress.     Breath sounds: Normal breath sounds.  Abdominal:     General: Bowel sounds are normal. There is no distension.     Palpations: Abdomen is soft.     Tenderness: There is no abdominal tenderness.  Musculoskeletal:        General: Normal range of motion.     Cervical back: Neck supple.     Right lower leg: No edema.     Left lower leg: No edema.  Lymphadenopathy:     Cervical: No cervical adenopathy.  Skin:    General: Skin is warm and dry.  Neurological:     Mental Status: She is alert. Mental status is at baseline.  Psychiatric:        Mood and Affect: Mood normal.      ASSESSMENT/PLAN  TODAY  1. GERD without esophagitis: is stable will continue to monitor   2. Vit D deficiency: is stable D 56.74 will monitor  3. Chronic hyperkalemia:  is stable k+ 4.3 will continue kayexalate 15 gm three times weekly   4. Benign hypertension with chronic renal disease stage VI: is stable b/p 123/75 will continue labetalol 200 mg twice daily and will monitor   5. Age related osteoporosis without current pathological fracture is stable will monitor   6. Anemia due to stage 4 chronic kidney disease: is stable hgb 9.7 will continue iron daily and will  monitor   7. Non-intentional  weight loss is without change: is slowly losing weight her current weight is 110 pounds; which is currently stable  8. Dementia without behavioral disturbance unspecified dementia type: no significant change in weight; is currently 110 pounds will monitor   9. Stage 4 chronic kidney disease bun 74 creat 1.30 will monitor          [name] [facility]  Contact [phone] Monday through Friday 8am- 5pm  After hours call [phone]

## 2020-05-21 ENCOUNTER — Non-Acute Institutional Stay (SKILLED_NURSING_FACILITY): Payer: Medicare PPO | Admitting: Adult Health

## 2020-05-21 ENCOUNTER — Encounter: Payer: Self-pay | Admitting: Adult Health

## 2020-05-21 DIAGNOSIS — E559 Vitamin D deficiency, unspecified: Secondary | ICD-10-CM | POA: Diagnosis not present

## 2020-05-21 DIAGNOSIS — E875 Hyperkalemia: Secondary | ICD-10-CM | POA: Diagnosis not present

## 2020-05-21 DIAGNOSIS — K219 Gastro-esophageal reflux disease without esophagitis: Secondary | ICD-10-CM | POA: Diagnosis not present

## 2020-05-21 NOTE — Progress Notes (Signed)
Location:    [place] Room Number: 109/W Place of Service:  SNF (31)   CODE STATUS: DNR  No Known Allergies  Chief Complaint  Patient presents with  . Medical Management of Chronic Issues             GERD without esophagitis:  Vit D deficiency:  Chronic hyperkalemia:    HPI:  She is a 86 year old long term resident of this facility being seen for the management of her chronic illnesses: GERD without esophagitis:  Vit D deficiency:  Chronic hyperkalemia. There are no reports of uncontrolled pain. No reports of agitation or anxiety.   Past Medical History:  Diagnosis Date  . Anemia   . Anxiety   . Dementia ([place])   . Dysphagia, oropharyngeal phase   . GERD (gastroesophageal reflux disease)   . Hyperpotassemia   . Hyperpotassemia   . Hypertension   . Other severe protein-calorie malnutrition     Past Surgical History:  Procedure Laterality Date  . ABDOMINAL HYSTERECTOMY    . APPENDECTOMY      Social History   Socioeconomic History  . Marital status: Widowed    Spouse name: Not on file  . Number of children: Not on file  . Years of education: Not on file  . Highest education level: Not on file  Occupational History  . Not on file  Tobacco Use  . Smoking status: Never Smoker  . Smokeless tobacco: Never Used  Substance and Sexual Activity  . Alcohol use: No  . Drug use: No  . Sexual activity: Never  Other Topics Concern  . Not on file  Social History Narrative  . Not on file   Social Determinants of Health   Financial Resource Strain:   . Difficulty of Paying Living Expenses: Not on file  Food Insecurity:   . Worried About Charity fundraiser in the Last Year: Not on file  . Ran Out of Food in the Last Year: Not on file  Transportation Needs:   . Lack of Transportation (Medical): Not on file  . Lack of Transportation (Non-Medical): Not on file  Physical Activity:   . Days of Exercise per Week: Not on file  . Minutes of  Exercise per Session: Not on file  Stress:   . Feeling of Stress : Not on file  Social Connections:   . Frequency of Communication with Friends and Family: Not on file  . Frequency of Social Gatherings with Friends and Family: Not on file  . Attends Religious Services: Not on file  . Active Member of Clubs or Organizations: Not on file  . Attends Archivist Meetings: Not on file  . Marital Status: Not on file  Intimate Partner Violence:   . Fear of Current or Ex-Partner: Not on file  . Emotionally Abused: Not on file  . Physically Abused: Not on file  . Sexually Abused: Not on file   No family history on file.    VITAL SIGNS BP 110/66   Pulse 80   Temp 97.9 F (36.6 C)   Ht 5' 5.5" (1.664 m)   Wt 108 lb 12.8 oz (49.4 kg)   BMI 17.83 kg/m   Outpatient Encounter Medications as of 05/21/2020  Medication Sig  . ferrous sulfate 220 (44 Fe) MG/5ML solution Take by mouth daily with breakfast. 7.4 cc  . hydrocortisone (ANUSOL-HC) 2.5 % rectal cream Apply 1 application topically 2 (two) times daily as needed for  hemorrhoids or anal itching.  . labetalol (NORMODYNE) 200 MG tablet Take 200 mg by mouth 2 (two) times daily. For HTN  . magnesium hydroxide (MILK OF MAGNESIA) 400 MG/5ML suspension Take 30 mLs by mouth daily as needed for mild constipation.  . NON FORMULARY Diet Type:  Mechanical soft Diet  . Nutritional Supplements (ENSURE ENLIVE PO) Take 1 Bottle by mouth 3 (three) times daily with meals.   . sodium polystyrene (KAYEXALATE) 15 GM/60ML suspension 8ml; oral  Special Instructions: Mix with 160ml of water Provided, shake vigorously and immediately prior to administration.MEDICINE IN REFRIGERATOR. Dx: Renal insufficiency with hyperkalemia Once A Day on Sun, Tue, Thu 12:00 PM   No facility-administered encounter medications on file as of 05/21/2020.     SIGNIFICANT DIAGNOSTIC EXAMS   LABS REVIEWED PREVIOUS:   08-13-19: wbc 5.0; hgb 9.8; hct 30.2; mcv 104.5 plt  185; glucose 73; bun 52; creat 1.40 k+ 4.1; na++ 140; ca 9.5; liver normal albumin 3.4 iron 55; tibc 230 10-12-19: vit D 72.53  02-18-20: wbc 6.2; hgb 9.7; hct 31.2; mcv 108.7 plt 174; glucose 09; bun 74; creat 1.30 k+ 4.3; na++ 141; ca 10.6; liver normal albumin 3.5 tsh 2.712; vit D 56.74   NO NEW LABS.   Review of Systems  Unable to perform ROS: Dementia (unable to participate )    Physical Exam Constitutional:      General: She is not in acute distress.    Appearance: She is underweight. She is not diaphoretic.  Neck:     Thyroid: No thyromegaly.  Cardiovascular:     Rate and Rhythm: Normal rate and regular rhythm.     Pulses: Normal pulses.     Heart sounds: Normal heart sounds.  Pulmonary:     Effort: Pulmonary effort is normal. No respiratory distress.     Breath sounds: Normal breath sounds.  Abdominal:     General: Bowel sounds are normal. There is no distension.     Palpations: Abdomen is soft.     Tenderness: There is no abdominal tenderness.  Musculoskeletal:        General: Normal range of motion.     Cervical back: Neck supple.     Right lower leg: No edema.     Left lower leg: No edema.  Lymphadenopathy:     Cervical: No cervical adenopathy.  Skin:    General: Skin is warm and dry.  Neurological:     Mental Status: She is alert. Mental status is at baseline.  Psychiatric:        Mood and Affect: Mood normal.      ASSESSMENT/PLAN  TODAY  1. GERD without esophagitis: is stable will continue to monitor   2. Vit D deficiency: is stable D is 56.74 will monitor   3. Chronic hyperkalemia: is stable k+ 4.3 will continue kayexalate 15 gm three times weekly    PREVIOUS   4. Benign hypertension with chronic renal disease stage VI: is stable b/p 110/66 will continue labetalol 200 mg twice daily and will monitor   5. Age related osteoporosis without current pathological fracture is stable will monitor   6. Anemia due to stage 4 chronic kidney disease: is  stable hgb 9.7 will continue iron daily and will  monitor   7. Non-intentional weight loss is without change: is slowly losing weight her current weight is 110 pounds; which is currently stable  8. Dementia without behavioral disturbance unspecified dementia type: no significant change in weight; is currently 108 pounds  will monitor   9. Stage 4 chronic kidney disease bun 74 creat 1.30 will monitor      MD is aware of resident's narcotic use and is in agreement with current plan of care. We will attempt to wean resident as appropriate.  [name] [facility]  Contact [phone] Monday through Friday 8am- 5pm  After hours call [phone]

## 2020-06-19 ENCOUNTER — Non-Acute Institutional Stay (SKILLED_NURSING_FACILITY): Payer: Medicare PPO | Admitting: Adult Health

## 2020-06-19 ENCOUNTER — Encounter: Payer: Self-pay | Admitting: Adult Health

## 2020-06-19 DIAGNOSIS — N184 Chronic kidney disease, stage 4 (severe): Secondary | ICD-10-CM

## 2020-06-19 DIAGNOSIS — I129 Hypertensive chronic kidney disease with stage 1 through stage 4 chronic kidney disease, or unspecified chronic kidney disease: Secondary | ICD-10-CM | POA: Diagnosis not present

## 2020-06-19 DIAGNOSIS — F0390 Unspecified dementia without behavioral disturbance: Secondary | ICD-10-CM | POA: Diagnosis not present

## 2020-06-19 NOTE — Progress Notes (Signed)
Location:    [place] Room Number: 109/W Place of Service:  SNF (31)   CODE STATUS: DNR  No Known Allergies  Chief Complaint  Patient presents with  . Acute Visit    Care Plan Meeting    HPI:  We have come together for her care plan meeting. BIMS 5/15 mood 0/30. No reports of falls. Requires extensive assist with adls and is incontinent of bladder and frequently incontinent of bowel. Her current weight is 109 pounds; she will chew on foods then spit them out. There are no reports of uncontrolled pain; no agitation no anxiety. She continues to be followed for her chronic illnesses including: Dementia without behavioral disturbance unspecified dementia type    Chronic renal disease stage IV: severely decreased  Benign hypertension with chronic kidney disease stage IV  Past Medical History:  Diagnosis Date  . Anemia   . Anxiety   . Dementia ([place])   . Dysphagia, oropharyngeal phase   . GERD (gastroesophageal reflux disease)   . Hyperpotassemia   . Hyperpotassemia   . Hypertension   . Other severe protein-calorie malnutrition     Past Surgical History:  Procedure Laterality Date  . ABDOMINAL HYSTERECTOMY    . APPENDECTOMY      Social History   Socioeconomic History  . Marital status: Widowed    Spouse name: Not on file  . Number of children: Not on file  . Years of education: Not on file  . Highest education level: Not on file  Occupational History  . Not on file  Tobacco Use  . Smoking status: Never Smoker  . Smokeless tobacco: Never Used  Substance and Sexual Activity  . Alcohol use: No  . Drug use: No  . Sexual activity: Never  Other Topics Concern  . Not on file  Social History Narrative  . Not on file   Social Determinants of Health   Financial Resource Strain:   . Difficulty of Paying Living Expenses: Not on file  Food Insecurity:   . Worried About Charity fundraiser in the Last Year: Not on file  . Ran Out of Food in the  Last Year: Not on file  Transportation Needs:   . Lack of Transportation (Medical): Not on file  . Lack of Transportation (Non-Medical): Not on file  Physical Activity:   . Days of Exercise per Week: Not on file  . Minutes of Exercise per Session: Not on file  Stress:   . Feeling of Stress : Not on file  Social Connections:   . Frequency of Communication with Friends and Family: Not on file  . Frequency of Social Gatherings with Friends and Family: Not on file  . Attends Religious Services: Not on file  . Active Member of Clubs or Organizations: Not on file  . Attends Archivist Meetings: Not on file  . Marital Status: Not on file  Intimate Partner Violence:   . Fear of Current or Ex-Partner: Not on file  . Emotionally Abused: Not on file  . Physically Abused: Not on file  . Sexually Abused: Not on file   No family history on file.    VITAL SIGNS BP (!) 126/57   Pulse 80   Temp 98.4 F (36.9 C)   Ht 5' 5.5" (1.664 m)   Wt 111 lb 9.6 oz (50.6 kg)   BMI 18.29 kg/m   Outpatient Encounter Medications as of 06/19/2020  Medication Sig  . ferrous sulfate 220 (  44 Fe) MG/5ML solution Take by mouth daily with breakfast. 7.4 cc  . hydrocortisone (ANUSOL-HC) 2.5 % rectal cream Apply 1 application topically 2 (two) times daily as needed for hemorrhoids or anal itching.  . labetalol (NORMODYNE) 200 MG tablet Take 200 mg by mouth 2 (two) times daily. For HTN  . magnesium hydroxide (MILK OF MAGNESIA) 400 MG/5ML suspension Take 30 mLs by mouth daily as needed for mild constipation.  . NON FORMULARY Diet Type:  Mechanical soft Diet  . Nutritional Supplements (ENSURE ENLIVE PO) Take 1 Bottle by mouth 3 (three) times daily with meals.   . sodium polystyrene (KAYEXALATE) 15 GM/60ML suspension 64ml; oral  Special Instructions: Mix with 131ml of water Provided, shake vigorously and immediately prior to administration.MEDICINE IN REFRIGERATOR. Dx: Renal insufficiency with  hyperkalemia Once A Day on Sun, Tue, Thu 12:00 PM   No facility-administered encounter medications on file as of 06/19/2020.     SIGNIFICANT DIAGNOSTIC EXAMS   LABS REVIEWED PREVIOUS:   08-13-19: wbc 5.0; hgb 9.8; hct 30.2; mcv 104.5 plt 185; glucose 73; bun 52; creat 1.40 k+ 4.1; na++ 140; ca 9.5; liver normal albumin 3.4 iron 55; tibc 230 10-12-19: vit D 72.53  02-18-20: wbc 6.2; hgb 9.7; hct 31.2; mcv 108.7 plt 174; glucose 09; bun 74; creat 1.30 k+ 4.3; na++ 141; ca 10.6; liver normal albumin 3.5 tsh 2.712; vit D 56.74   NO NEW LABS.   Review of Systems  Unable to perform ROS: Dementia (unable to participate )    Physical Exam Constitutional:      General: She is not in acute distress.    Appearance: She is underweight. She is not diaphoretic.  Neck:     Thyroid: No thyromegaly.  Cardiovascular:     Rate and Rhythm: Normal rate and regular rhythm.     Pulses: Normal pulses.     Heart sounds: Normal heart sounds.  Pulmonary:     Effort: Pulmonary effort is normal. No respiratory distress.     Breath sounds: Normal breath sounds.  Abdominal:     General: Bowel sounds are normal. There is no distension.     Palpations: Abdomen is soft.     Tenderness: There is no abdominal tenderness.  Musculoskeletal:        General: Normal range of motion.     Cervical back: Neck supple.     Right lower leg: No edema.     Left lower leg: No edema.  Lymphadenopathy:     Cervical: No cervical adenopathy.  Skin:    General: Skin is warm and dry.  Neurological:     Mental Status: She is alert. Mental status is at baseline.  Psychiatric:        Mood and Affect: Mood normal.     ASSESSMENT/ PLAN:  TODAY  1. Dementia without behavioral disturbance unspecified dementia type 2. Chronic renal disease stage IV: severely decreased 3. Benign hypertension with chronic kidney disease stage IV  Will continue current medications Will continue current plan of care Will continue to  monitor her status.   MD is aware of resident's narcotic use and is in agreement with current plan of care. We will attempt to wean resident as appropriate.  [name] [facility]  Contact [phone] Monday through Friday 8am- 5pm  After hours call [phone]

## 2020-06-24 ENCOUNTER — Non-Acute Institutional Stay (SKILLED_NURSING_FACILITY): Payer: Medicare PPO | Admitting: Adult Health

## 2020-06-24 ENCOUNTER — Encounter: Payer: Self-pay | Admitting: Adult Health

## 2020-06-24 DIAGNOSIS — I129 Hypertensive chronic kidney disease with stage 1 through stage 4 chronic kidney disease, or unspecified chronic kidney disease: Secondary | ICD-10-CM | POA: Diagnosis not present

## 2020-06-24 DIAGNOSIS — Z66 Do not resuscitate: Secondary | ICD-10-CM | POA: Diagnosis not present

## 2020-06-24 DIAGNOSIS — N184 Chronic kidney disease, stage 4 (severe): Secondary | ICD-10-CM

## 2020-06-24 DIAGNOSIS — M810 Age-related osteoporosis without current pathological fracture: Secondary | ICD-10-CM | POA: Diagnosis not present

## 2020-06-24 DIAGNOSIS — D631 Anemia in chronic kidney disease: Secondary | ICD-10-CM

## 2020-06-24 NOTE — Progress Notes (Signed)
Location:    [place] Room Number: 109-W Place of Service:  SNF (31)    CODE STATUS: DNR  No Known Allergies  Chief Complaint  Patient presents with  . 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 There are no reports of uncontrolled pain; her weight is stable. There are no reports of agitation or anxiety.   Past Medical History:  Diagnosis Date  . Anemia   . Anxiety   . Dementia ([place])   . Dysphagia, oropharyngeal phase   . GERD (gastroesophageal reflux disease)   . Hyperpotassemia   . Hyperpotassemia   . Hypertension   . Other severe protein-calorie malnutrition     Past Surgical History:  Procedure Laterality Date  . ABDOMINAL HYSTERECTOMY    . APPENDECTOMY      Social History   Socioeconomic History  . Marital status: Widowed    Spouse name: Not on file  . Number of children: Not on file  . Years of education: Not on file  . Highest education level: Not on file  Occupational History  . Not on file  Tobacco Use  . Smoking status: Never Smoker  . Smokeless tobacco: Never Used  Vaping Use  . Vaping Use: Never used  Substance and Sexual Activity  . Alcohol use: No  . Drug use: No  . Sexual activity: Never  Other Topics Concern  . Not on file  Social History Narrative  . Not on file   Social Determinants of Health   Financial Resource Strain:   . Difficulty of Paying Living Expenses: Not on file  Food Insecurity:   . Worried About Charity fundraiser in the Last  Year: Not on file  . Ran Out of Food in the Last Year: Not on file  Transportation Needs:   . Lack of Transportation (Medical): Not on file  . Lack of Transportation (Non-Medical): Not on file  Physical Activity:   . Days of Exercise per Week: Not on file  . Minutes of Exercise per Session: Not on file  Stress:   . Feeling of Stress : Not on file  Social Connections:   . Frequency of Communication with Friends and Family: Not on file  . Frequency of Social Gatherings with Friends and Family: Not on file  . Attends Religious Services: Not on file  . Active Member of Clubs or Organizations: Not on file  . Attends Archivist Meetings: Not on file  . Marital Status: Not on file  Intimate Partner Violence:   . Fear of Current or Ex-Partner: Not on file  . Emotionally Abused: Not on file  . Physically Abused: Not on file  . Sexually Abused: Not on file   History reviewed. No pertinent family history.    VITAL SIGNS BP 136/70   Pulse 70   Temp 97.6 F (36.4 C)   Resp 18   Ht 5' 5.5" (1.664 m)  Wt 111 lb 9.6 oz (50.6 kg)   SpO2 97%   BMI 18.29 kg/m   Outpatient Encounter Medications as of 06/24/2020  Medication Sig  . ferrous sulfate 220 (44 Fe) MG/5ML solution Take by mouth daily with breakfast. 7.4 cc  . hydrocortisone (ANUSOL-HC) 2.5 % rectal cream Apply 1 application topically 2 (two) times daily as needed for hemorrhoids or anal itching.  . labetalol (NORMODYNE) 200 MG tablet Take 200 mg by mouth 2 (two) times daily. For HTN  . magnesium hydroxide (MILK OF MAGNESIA) 400 MG/5ML suspension Take 30 mLs by mouth daily as needed for mild constipation.  . NON FORMULARY Diet Type: Dys (ground), thin liquids  . Nutritional Supplements (ENSURE ENLIVE PO) Take 1 Bottle by mouth 3 (three) times daily with meals.   . sodium polystyrene (KAYEXALATE) 15 GM/60ML suspension 57ml; oral  Special Instructions: Mix with 178ml of water Provided, shake vigorously and immediately  prior to administration.MEDICINE IN REFRIGERATOR. Dx: Renal insufficiency with hyperkalemia Once A Day on Sun, Tue, Thu 12:00 PM   No facility-administered encounter medications on file as of 06/24/2020.     SIGNIFICANT DIAGNOSTIC EXAMS   LABS REVIEWED PREVIOUS:   08-13-19: wbc 5.0; hgb 9.8; hct 30.2; mcv 104.5 plt 185; glucose 73; bun 52; creat 1.40 k+ 4.1; na++ 140; ca 9.5; liver normal albumin 3.4 iron 55; tibc 230 10-12-19: vit D 72.53  02-18-20: wbc 6.2; hgb 9.7; hct 31.2; mcv 108.7 plt 174; glucose 09; bun 74; creat 1.30 k+ 4.3; na++ 141; ca 10.6; liver normal albumin 3.5 tsh 2.712; vit D 56.74   NO NEW LABS.   Review of Systems  Unable to perform ROS: Dementia (unable to participate )    Physical Exam Constitutional:      General: She is not in acute distress.    Appearance: She is underweight. She is not diaphoretic.  Neck:     Thyroid: No thyromegaly.  Cardiovascular:     Rate and Rhythm: Normal rate and regular rhythm.     Pulses: Normal pulses.     Heart sounds: Normal heart sounds.  Pulmonary:     Effort: Pulmonary effort is normal. No respiratory distress.     Breath sounds: Normal breath sounds.  Abdominal:     General: Bowel sounds are normal. There is no distension.     Palpations: Abdomen is soft.     Tenderness: There is no abdominal tenderness.  Musculoskeletal:        General: Normal range of motion.     Cervical back: Neck supple.     Right lower leg: No edema.     Left lower leg: No edema.  Lymphadenopathy:     Cervical: No cervical adenopathy.  Skin:    General: Skin is warm and dry.  Neurological:     Mental Status: She is alert. Mental status is at baseline.  Psychiatric:        Mood and Affect: Mood normal.       ASSESSMENT/PLAN  TODAY  1. Benign hypertension with chronic renal disease stage VI: is stable b/p 136/70 will continue labetalol 200 mg twice daily   2. Age related osteoporosis without current pathological fracture: is  stable will monitor   3. Anemia due to stage 4 chronic kidney disease is stable hgb 9.7 will continue iron daily and will monitor   PREVIOUS   4. Non-intentional weight loss is without change: is slowly losing weight her current weight is 111 pounds; which is currently stable  5. Dementia without behavioral disturbance unspecified dementia type: no significant change in weight; is currently 111 pounds will monitor   6. Stage 4 chronic kidney disease bun 74 creat 1.30 will monitor    7. GERD without esophagitis: is stable will continue to monitor   8. Vit D deficiency: is stable D is 56.74 will monitor   9. Chronic hyperkalemia: is stable k+ 4.3 will continue kayexalate 15 gm three times weekly    MD is aware of resident's narcotic use and is in agreement with current plan of care. We will attempt to wean resident as appropriate.  [name] [facility]  Contact [phone] Monday through Friday 8am- 5pm  After hours call [phone]

## 2020-07-22 ENCOUNTER — Encounter: Payer: Self-pay | Admitting: Adult Health

## 2020-07-22 ENCOUNTER — Non-Acute Institutional Stay (SKILLED_NURSING_FACILITY): Payer: Medicare PPO | Admitting: Adult Health

## 2020-07-22 DIAGNOSIS — N184 Chronic kidney disease, stage 4 (severe): Secondary | ICD-10-CM | POA: Diagnosis not present

## 2020-07-22 DIAGNOSIS — F0390 Unspecified dementia without behavioral disturbance: Secondary | ICD-10-CM

## 2020-07-22 DIAGNOSIS — R627 Adult failure to thrive: Secondary | ICD-10-CM | POA: Diagnosis not present

## 2020-07-22 NOTE — Progress Notes (Signed)
Location:    [place] Room Number: 109-W Place of Service:  SNF (31)   CODE STATUS: DNR  No Known Allergies  Chief Complaint  Patient presents with  . Medical Management of Chronic Issues          Failure to thrive in adult:     Dementia without behavioral disturbance unspecified dementia type:     Stage 4 chronic kidney disease:    HPI:  She is a 86 year old long term resident of this facility being seen for the management of her chronic illnesses:  Failure to thrive in adult:     Dementia without behavioral disturbance unspecified dementia type:     Stage 4 chronic kidney disease. There are no reports of uncontrolled pain. Her appetite remains poor. There are no reports of agitation or anxiety.   Past Medical History:  Diagnosis Date  . Anemia   . Anxiety   . Dementia ([place])   . Dysphagia, oropharyngeal phase   . GERD (gastroesophageal reflux disease)   . Hyperpotassemia   . Hyperpotassemia   . Hypertension   . Other severe protein-calorie malnutrition     Past Surgical History:  Procedure Laterality Date  . ABDOMINAL HYSTERECTOMY    . APPENDECTOMY      Social History   Socioeconomic History  . Marital status: Widowed    Spouse name: Not on file  . Number of children: Not on file  . Years of education: Not on file  . Highest education level: Not on file  Occupational History  . Not on file  Tobacco Use  . Smoking status: Never Smoker  . Smokeless tobacco: Never Used  Vaping Use  . Vaping Use: Never used  Substance and Sexual Activity  . Alcohol use: No  . Drug use: No  . Sexual activity: Never  Other Topics Concern  . Not on file  Social History Narrative  . Not on file   Social Determinants of Health   Financial Resource Strain:   . Difficulty of Paying Living Expenses: Not on file  Food Insecurity:   . Worried About Charity fundraiser in the Last Year: Not on file  . Ran Out of Food in the Last Year: Not on file   Transportation Needs:   . Lack of Transportation (Medical): Not on file  . Lack of Transportation (Non-Medical): Not on file  Physical Activity:   . Days of Exercise per Week: Not on file  . Minutes of Exercise per Session: Not on file  Stress:   . Feeling of Stress : Not on file  Social Connections:   . Frequency of Communication with Friends and Family: Not on file  . Frequency of Social Gatherings with Friends and Family: Not on file  . Attends Religious Services: Not on file  . Active Member of Clubs or Organizations: Not on file  . Attends Archivist Meetings: Not on file  . Marital Status: Not on file  Intimate Partner Violence:   . Fear of Current or Ex-Partner: Not on file  . Emotionally Abused: Not on file  . Physically Abused: Not on file  . Sexually Abused: Not on file   History reviewed. No pertinent family history.    VITAL SIGNS BP (!) 156/84   Pulse 72   Temp (!) 97.5 F (36.4 C)   Resp 18   Ht 5' 5.5" (1.664 m)   Wt 108 lb (49 kg)   SpO2 97%  BMI 17.70 kg/m   Outpatient Encounter Medications as of 07/22/2020  Medication Sig  . ferrous sulfate 220 (44 Fe) MG/5ML solution Take by mouth daily with breakfast. 7.4 cc  . hydrocortisone (ANUSOL-HC) 2.5 % rectal cream Apply 1 application topically 2 (two) times daily as needed for hemorrhoids or anal itching.  . labetalol (NORMODYNE) 200 MG tablet Take 200 mg by mouth 2 (two) times daily. For HTN  . magnesium hydroxide (MILK OF MAGNESIA) 400 MG/5ML suspension Take 30 mLs by mouth daily as needed for mild constipation.  . NON FORMULARY Diet Type: Dys 2 (ground), thin liquids  . Nutritional Supplements (ENSURE ENLIVE PO) Take 1 Bottle by mouth 3 (three) times daily with meals.   . sodium polystyrene (KAYEXALATE) 15 GM/60ML suspension 50ml; oral  Special Instructions: Mix with 134ml of water Provided, shake vigorously and immediately prior to administration.MEDICINE IN REFRIGERATOR. Dx: Renal  insufficiency with hyperkalemia Once A Day on Sun, Tue, Thu 12:00 PM   No facility-administered encounter medications on file as of 07/22/2020.     SIGNIFICANT DIAGNOSTIC EXAMS   LABS REVIEWED PREVIOUS:   08-13-19: wbc 5.0; hgb 9.8; hct 30.2; mcv 104.5 plt 185; glucose 73; bun 52; creat 1.40 k+ 4.1; na++ 140; ca 9.5; liver normal albumin 3.4 iron 55; tibc 230 10-12-19: vit D 72.53  02-18-20: wbc 6.2; hgb 9.7; hct 31.2; mcv 108.7 plt 174; glucose 09; bun 74; creat 1.30 k+ 4.3; na++ 141; ca 10.6; liver normal albumin 3.5 tsh 2.712; vit D 56.74   NO NEW LABS.   Review of Systems  Unable to perform ROS: Dementia (unable to participate )    Physical Exam Constitutional:      General: She is not in acute distress.    Appearance: She is underweight and well-nourished. She is not diaphoretic.  Neck:     Thyroid: No thyromegaly.  Cardiovascular:     Rate and Rhythm: Normal rate and regular rhythm.     Pulses: Normal pulses and intact distal pulses.     Heart sounds: Normal heart sounds.  Pulmonary:     Effort: Pulmonary effort is normal. No respiratory distress.     Breath sounds: Normal breath sounds.  Abdominal:     General: Bowel sounds are normal. There is no distension.     Palpations: Abdomen is soft.     Tenderness: There is no abdominal tenderness.  Musculoskeletal:        General: No edema. Normal range of motion.     Cervical back: Neck supple.     Right lower leg: No edema.     Left lower leg: No edema.  Lymphadenopathy:     Cervical: No cervical adenopathy.  Skin:    General: Skin is warm and dry.  Neurological:     Mental Status: She is alert. Mental status is at baseline.  Psychiatric:        Mood and Affect: Mood and affect and mood normal.      ASSESSMENT/PLAN  TODAY  1. Failure to thrive in adult: is without change her current weight is 108 pounds previous 111 pounds. Will continue supplements as directed  2. Dementia without behavioral disturbance  unspecified dementia type: is without significant change weight is 108 pounds previous 111 pounds; unfortunately weight loss is an expected outcome in the late stages of this disease process.   3. Stage 4 chronic kidney disease: is stable bun 74; creat 1.30 will monitor   PREVIOUS   4. GERD without esophagitis: is  stable will continue to monitor   5. Vit D deficiency: is stable D is 56.74 will monitor   6. Chronic hyperkalemia: is stable k+ 4.3 will continue kayexalate 15 gm three times weekly   7. Benign hypertension with chronic renal disease stage VI: is stable b/p 156/84 will continue labetalol 200 mg twice daily   8. Age related osteoporosis without current pathological fracture: is stable will monitor   9. Anemia due to stage 4 chronic kidney disease is stable hgb 9.7 will continue iron daily and will monitor      MD is aware of resident's narcotic use and is in agreement with current plan of care. We will attempt to wean resident as appropriate.  [name] [facility]  Contact [phone] Monday through Friday 8am- 5pm  After hours call [phone]

## 2020-08-25 ENCOUNTER — Non-Acute Institutional Stay (SKILLED_NURSING_FACILITY): Payer: Medicare PPO | Admitting: Adult Health

## 2020-08-25 ENCOUNTER — Encounter: Payer: Self-pay | Admitting: Adult Health

## 2020-08-25 DIAGNOSIS — E875 Hyperkalemia: Secondary | ICD-10-CM

## 2020-08-25 DIAGNOSIS — K219 Gastro-esophageal reflux disease without esophagitis: Secondary | ICD-10-CM | POA: Diagnosis not present

## 2020-08-25 DIAGNOSIS — E559 Vitamin D deficiency, unspecified: Secondary | ICD-10-CM

## 2020-08-25 NOTE — Progress Notes (Signed)
Location:  [place] Room Number: 109/W Place of Service:  SNF (31)   CODE STATUS: DNR  No Known Allergies  Chief Complaint  Patient presents with  . Medical Management of Chronic Issues       GERD without esophagitis  Vitamin D deficiency:  Chronic hyperkalemia    HPI:  She is a 87 year old long term resident of this facility being seen for the management of her chronic illnesses:  GERD without esophagitis  Vitamin D deficiency:  Chronic hyperkalemia. There are no reports of uncontrolled pain. There are no reports of changes in appetite; no reports of unusual weight loss. There are no reports of agitation or anxiety.    Past Medical History:  Diagnosis Date  . Anemia   . Anxiety   . Dementia ([place])   . Dysphagia, oropharyngeal phase   . GERD (gastroesophageal reflux disease)   . Hyperpotassemia   . Hyperpotassemia   . Hypertension   . Other severe protein-calorie malnutrition     Past Surgical History:  Procedure Laterality Date  . ABDOMINAL HYSTERECTOMY    . APPENDECTOMY      Social History   Socioeconomic History  . Marital status: Widowed    Spouse name: Not on file  . Number of children: Not on file  . Years of education: Not on file  . Highest education level: Not on file  Occupational History  . Not on file  Tobacco Use  . Smoking status: Never Smoker  . Smokeless tobacco: Never Used  Vaping Use  . Vaping Use: Never used  Substance and Sexual Activity  . Alcohol use: No  . Drug use: No  . Sexual activity: Never  Other Topics Concern  . Not on file  Social History Narrative  . Not on file   Social Determinants of Health   Financial Resource Strain: Not on file  Food Insecurity: Not on file  Transportation Needs: Not on file  Physical Activity: Not on file  Stress: Not on file  Social Connections: Not on file  Intimate Partner Violence: Not on file   History reviewed. No pertinent family history.    VITAL  SIGNS BP 114/69   Pulse 82   Temp 98 F (36.7 C)   Ht 5' 5.5" (1.664 m)   Wt 111 lb (50.3 kg)   BMI 18.19 kg/m   Outpatient Encounter Medications as of 08/25/2020  Medication Sig  . ferrous sulfate 220 (44 Fe) MG/5ML solution Take by mouth daily with breakfast. 7.4 cc  . hydrocortisone (ANUSOL-HC) 2.5 % rectal cream Apply 1 application topically 2 (two) times daily as needed for hemorrhoids or anal itching.  . labetalol (NORMODYNE) 200 MG tablet Take 200 mg by mouth 2 (two) times daily. For HTN  . magnesium hydroxide (MILK OF MAGNESIA) 400 MG/5ML suspension Take 30 mLs by mouth daily as needed for mild constipation.  . NON FORMULARY Diet Type: Dys 2 (ground), thin liquids  . Nutritional Supplements (ENSURE ENLIVE PO) Take 1 Bottle by mouth 3 (three) times daily with meals.   . sodium polystyrene (KAYEXALATE) 15 GM/60ML suspension 77ml; oral  Special Instructions: Mix with 178ml of water Provided, shake vigorously and immediately prior to administration.MEDICINE IN REFRIGERATOR. Dx: Renal insufficiency with hyperkalemia Once A Day on Sun, Tue, Thu 12:00 PM   No facility-administered encounter medications on file as of 08/25/2020.     SIGNIFICANT DIAGNOSTIC EXAMS   LABS REVIEWED PREVIOUS:   10-12-19: vit D 72.53  02-18-20: wbc 6.2; hgb 9.7; hct 31.2; mcv 108.7 plt 174; glucose 09; bun 74; creat 1.30 k+ 4.3; na++ 141; ca 10.6; liver normal albumin 3.5 tsh 2.712; vit D 56.74   NO NEW LABS.    Review of Systems  Unable to perform ROS: Dementia (unable to participate )   Physical Exam Constitutional:      General: She is not in acute distress.    Appearance: She is underweight and well-nourished. She is not diaphoretic.  Neck:     Thyroid: No thyromegaly.  Cardiovascular:     Rate and Rhythm: Normal rate and regular rhythm.     Pulses: Normal pulses and intact distal pulses.     Heart sounds: Normal heart sounds.  Pulmonary:     Effort: Pulmonary effort is normal. No  respiratory distress.     Breath sounds: Normal breath sounds.  Abdominal:     General: Bowel sounds are normal. There is no distension.     Palpations: Abdomen is soft.     Tenderness: There is no abdominal tenderness.  Musculoskeletal:        General: No edema. Normal range of motion.     Cervical back: Neck supple.     Right lower leg: No edema.     Left lower leg: No edema.  Lymphadenopathy:     Cervical: No cervical adenopathy.  Skin:    General: Skin is warm and dry.  Neurological:     Mental Status: She is alert. Mental status is at baseline.  Psychiatric:        Mood and Affect: Mood and affect and mood normal.       ASSESSMENT/PLAN  TODAY  1. GERD without esophagitis stable will monitor her status   2. Vitamin D deficiency: stable vit D level is 56.74 will monitor   3. Chronic hyperkalemia: is stable k+ 4.3 will continue kayexalate 15 gm three times weekly   PREVIOUS   4. Benign hypertension with chronic renal disease stage VI: is stable b/p 114/69 will continue labetalol 200 mg twice daily   5. Age related osteoporosis without current pathological fracture: is stable will monitor   6. Anemia due to stage 4 chronic kidney disease is stable hgb 9.7 will continue iron daily and will monitor   7. Failure to thrive in adult: is without change her current weight is 108 pounds previous 111 pounds. Will continue supplements as directed  8. Dementia without behavioral disturbance unspecified dementia type: is without significant change weight is 108 pounds previous 111 pounds; unfortunately weight loss is an expected outcome in the late stages of this disease process.   9. Stage 4 chronic kidney disease: is stable bun 74; creat 1.30 will monitor    Will check cbc; cmp; tsh vit d      MD is aware of resident's narcotic use and is in agreement with current plan of care. We will attempt to wean resident as appropriate.  [name] [facility]   Contact [phone] Monday through Friday 8am- 5pm  After hours call [phone]

## 2020-08-28 ENCOUNTER — Other Ambulatory Visit (HOSPITAL_COMMUNITY)
Admission: RE | Admit: 2020-08-28 | Discharge: 2020-08-28 | Disposition: A | Discharge status: Home / Self Care | Payer: Medicare PPO | Source: Skilled Nursing Facility | Attending: Adult Health | Admitting: Adult Health

## 2020-08-28 DIAGNOSIS — I129 Hypertensive chronic kidney disease with stage 1 through stage 4 chronic kidney disease, or unspecified chronic kidney disease: Secondary | ICD-10-CM | POA: Insufficient documentation

## 2020-08-28 LAB — COMPREHENSIVE METABOLIC PANEL
ALT: 13 U/L (ref 0–44)
AST: 20 U/L (ref 15–41)
Albumin: 3.4 g/dL — ABNORMAL LOW (ref 3.5–5.0)
Alkaline Phosphatase: 71 U/L (ref 38–126)
Anion gap: 7 (ref 5–15)
BUN: 44 mg/dL — ABNORMAL HIGH (ref 8–23)
CO2: 28 mmol/L (ref 22–32)
Calcium: 10.4 mg/dL — ABNORMAL HIGH (ref 8.9–10.3)
Chloride: 106 mmol/L (ref 98–111)
Creatinine, Ser: 1.28 mg/dL — ABNORMAL HIGH (ref 0.44–1.00)
GFR, Estimated: 38 mL/min — ABNORMAL LOW (ref >60)
Glucose, Bld: 89 mg/dL (ref 70–99)
Potassium: 3.9 mmol/L (ref 3.5–5.1)
Sodium: 141 mmol/L (ref 135–145)
Total Bilirubin: 0.6 mg/dL (ref 0.3–1.2)
Total Protein: 6.6 g/dL (ref 6.5–8.1)

## 2020-08-28 LAB — CBC
HCT: 31.2 % — ABNORMAL LOW (ref 36.0–46.0)
Hemoglobin: 9.9 g/dL — ABNORMAL LOW (ref 12.0–15.0)
MCH: 33.3 pg (ref 26.0–34.0)
MCHC: 31.7 g/dL (ref 30.0–36.0)
MCV: 105.1 fL — ABNORMAL HIGH (ref 80.0–100.0)
Platelets: 213 10*3/uL (ref 150–400)
RBC: 2.97 MIL/uL — ABNORMAL LOW (ref 3.87–5.11)
RDW: 12.9 % (ref 11.5–15.5)
WBC: 5.4 10*3/uL (ref 4.0–10.5)
nRBC: 0 % (ref 0.0–0.2)

## 2020-08-28 LAB — TSH: TSH: 2.948 u[IU]/mL (ref 0.350–4.500)

## 2020-08-28 LAB — VITAMIN D 25 HYDROXY (VIT D DEFICIENCY, FRACTURES): Vit D, 25-Hydroxy: 70.11 ng/mL (ref 30–100)

## 2020-09-12 ENCOUNTER — Encounter (HOSPITAL_COMMUNITY)
Admission: RE | Admit: 2020-09-12 | Discharge: 2020-09-12 | Disposition: A | Discharge status: Home / Self Care | Payer: Medicare PPO | Source: Skilled Nursing Facility | Attending: Adult Health | Admitting: Adult Health

## 2020-09-12 ENCOUNTER — Non-Acute Institutional Stay (SKILLED_NURSING_FACILITY): Payer: Medicare PPO | Admitting: Adult Health

## 2020-09-12 DIAGNOSIS — U071 COVID-19: Secondary | ICD-10-CM | POA: Insufficient documentation

## 2020-09-12 DIAGNOSIS — Z20822 Contact with and (suspected) exposure to covid-19: Secondary | ICD-10-CM | POA: Diagnosis not present

## 2020-09-12 DIAGNOSIS — R7989 Other specified abnormal findings of blood chemistry: Secondary | ICD-10-CM

## 2020-09-12 LAB — D-DIMER, QUANTITATIVE: D-Dimer, Quant: 2.41 ug/mL-FEU — ABNORMAL HIGH (ref 0.00–0.50)

## 2020-09-12 LAB — C-REACTIVE PROTEIN: CRP: 0.8 mg/dL (ref <1.0)

## 2020-09-16 DIAGNOSIS — Z20822 Contact with and (suspected) exposure to covid-19: Secondary | ICD-10-CM | POA: Insufficient documentation

## 2020-09-16 DIAGNOSIS — R7989 Other specified abnormal findings of blood chemistry: Secondary | ICD-10-CM | POA: Insufficient documentation

## 2020-09-16 NOTE — Progress Notes (Signed)
Location:  [place] Room Number: [id number] Place of Service:  SNF (31)   CODE STATUS: dnr  No Known Allergies   Chief Complaint  Patient presents with  . Acute Visit    Covid exposure     HPI:  Her roommate has tested positive for covid. There are no reports of cough; pain; no fever. She has tested negative at this time. We will treat as positive at this time; she has been started on vitamin supplements at this time. Her d-dimer has returned elevated at 2.41.   Past Medical History:  Diagnosis Date  . Anemia   . Anxiety   . Dementia ([place])   . Dysphagia, oropharyngeal phase   . GERD (gastroesophageal reflux disease)   . Hyperpotassemia   . Hyperpotassemia   . Hypertension   . Other severe protein-calorie malnutrition     Past Surgical History:  Procedure Laterality Date  . ABDOMINAL HYSTERECTOMY    . APPENDECTOMY      Social History   Socioeconomic History  . Marital status: Widowed    Spouse name: Not on file  . Number of children: Not on file  . Years of education: Not on file  . Highest education level: Not on file  Occupational History  . Not on file  Tobacco Use  . Smoking status: Never Smoker  . Smokeless tobacco: Never Used  Vaping Use  . Vaping Use: Never used  Substance and Sexual Activity  . Alcohol use: No  . Drug use: No  . Sexual activity: Never  Other Topics Concern  . Not on file  Social History Narrative  . Not on file   Social Determinants of Health   Financial Resource Strain: Not on file  Food Insecurity: Not on file  Transportation Needs: Not on file  Physical Activity: Not on file  Stress: Not on file  Social Connections: Not on file  Intimate Partner Violence: Not on file   No family history on file.    VITAL SIGNS BP 113/63   Pulse 78   Temp (!) 97.5 F (36.4 C)   Ht 5' 5.5" (1.664 m)   Wt 111 lb (50.3 kg)   BMI 18.19 kg/m   Outpatient Encounter Medications as of 09/12/2020  Medication  Sig  . ferrous sulfate 220 (44 Fe) MG/5ML solution Take by mouth daily with breakfast. 7.4 cc  . hydrocortisone (ANUSOL-HC) 2.5 % rectal cream Apply 1 application topically 2 (two) times daily as needed for hemorrhoids or anal itching.  . labetalol (NORMODYNE) 200 MG tablet Take 200 mg by mouth 2 (two) times daily. For HTN  . magnesium hydroxide (MILK OF MAGNESIA) 400 MG/5ML suspension Take 30 mLs by mouth daily as needed for mild constipation.  . NON FORMULARY Diet Type: Dys 2 (ground), thin liquids  . Nutritional Supplements (ENSURE ENLIVE PO) Take 1 Bottle by mouth 3 (three) times daily with meals.   . sodium polystyrene (KAYEXALATE) 15 GM/60ML suspension 50m; oral  Special Instructions: Mix with 1290mof water Provided, shake vigorously and immediately prior to administration.MEDICINE IN REFRIGERATOR. Dx: Renal insufficiency with hyperkalemia Once A Day on Sun, Tue, Thu 12:00 PM   No facility-administered encounter medications on file as of 09/12/2020.     SIGNIFICANT DIAGNOSTIC EXAMS   LABS REVIEWED PREVIOUS:   10-12-19: vit D 72.53  02-18-20: wbc 6.2; hgb 9.7; hct 31.2; mcv 108.7 plt 174; glucose 09; bun 74; creat 1.30 k+ 4.3; na++ 141; ca 10.6; liver normal  albumin 3.5 tsh 2.712; vit D 56.74   NO NEW LABS.    Review of Systems  Unable to perform ROS: Dementia (is unable to participate )   Physical Exam Constitutional:      General: She is not in acute distress.    Appearance: She is well-developed and well-nourished. She is not diaphoretic.  Neck:     Thyroid: No thyromegaly.  Cardiovascular:     Rate and Rhythm: Normal rate and regular rhythm.     Pulses: Normal pulses and intact distal pulses.     Heart sounds: Normal heart sounds.  Pulmonary:     Effort: Pulmonary effort is normal. No respiratory distress.     Breath sounds: Normal breath sounds.  Abdominal:     General: Bowel sounds are normal. There is no distension.     Palpations: Abdomen is soft.      Tenderness: There is no abdominal tenderness.  Musculoskeletal:        General: No edema. Normal range of motion.     Cervical back: Neck supple.     Right lower leg: No edema.     Left lower leg: No edema.  Lymphadenopathy:     Cervical: No cervical adenopathy.  Skin:    General: Skin is warm and dry.  Neurological:     Mental Status: She is alert. Mental status is at baseline.  Psychiatric:        Mood and Affect: Mood and affect and mood normal.      ASSESSMENT/ PLAN:  TODAY  1. Exposure to covid 2. Elevated D dimer  Will begin vit c vit d and zinc for 2 weeks Will begin eliquis 2.5 mg twice daily until d-dimer becomes normal     MD is aware of resident's narcotic use and is in agreement with current plan of care. We will attempt to wean resident as appropriate.  [name] [facility]  Contact [phone] Monday through Friday 8am- 5pm  After hours call [phone]

## 2020-09-18 ENCOUNTER — Encounter: Payer: Self-pay | Admitting: Adult Health

## 2020-09-18 ENCOUNTER — Non-Acute Institutional Stay (SKILLED_NURSING_FACILITY): Payer: Medicare PPO | Admitting: Adult Health

## 2020-09-18 DIAGNOSIS — R627 Adult failure to thrive: Secondary | ICD-10-CM | POA: Diagnosis not present

## 2020-09-18 DIAGNOSIS — N184 Chronic kidney disease, stage 4 (severe): Secondary | ICD-10-CM

## 2020-09-18 DIAGNOSIS — I129 Hypertensive chronic kidney disease with stage 1 through stage 4 chronic kidney disease, or unspecified chronic kidney disease: Secondary | ICD-10-CM

## 2020-09-18 DIAGNOSIS — F0390 Unspecified dementia without behavioral disturbance: Secondary | ICD-10-CM

## 2020-09-18 NOTE — Progress Notes (Signed)
Location:  [place] Room Number: 109/W Place of Service:  SNF (31)   CODE STATUS: DNR  No Known Allergies  Chief Complaint  Patient presents with  . Acute Visit    Care Plan Meeting     HPI:  We have come together for her care plan meeting. BIMS 05/15 mood 6/30. She requires limited to extensive assistance with her adls. She is incontinent of bladder and frequently incontinent of bowel. She feeds herself is nonambulatory. Has been exposed to covid she had an elevated d-dimer and was placed on elquis through 09-19-20. There have been no falls. She continues to lose weight 111 pounds to feb weight 105 pounds has poor appetite; can be resistance to feeding assistance. She does try to feed herself; will spit out food after chewing. She does like sweets.  She will continue to be followed for her chronic illnesses including:  Benign hypertension with chronic kidney disease stage IV  Failure to thrive in adult Dementia without behavioral disturbance unspecified dementia type  Past Medical History:  Diagnosis Date  . Anemia   . Anxiety   . Dementia ([place])   . Dysphagia, oropharyngeal phase   . GERD (gastroesophageal reflux disease)   . Hyperpotassemia   . Hyperpotassemia   . Hypertension   . Other severe protein-calorie malnutrition     Past Surgical History:  Procedure Laterality Date  . ABDOMINAL HYSTERECTOMY    . APPENDECTOMY      Social History   Socioeconomic History  . Marital status: Widowed    Spouse name: Not on file  . Number of children: Not on file  . Years of education: Not on file  . Highest education level: Not on file  Occupational History  . Not on file  Tobacco Use  . Smoking status: Never Smoker  . Smokeless tobacco: Never Used  Vaping Use  . Vaping Use: Never used  Substance and Sexual Activity  . Alcohol use: No  . Drug use: No  . Sexual activity: Never  Other Topics Concern  . Not on file  Social History Narrative  .  Not on file   Social Determinants of Health   Financial Resource Strain: Not on file  Food Insecurity: Not on file  Transportation Needs: Not on file  Physical Activity: Not on file  Stress: Not on file  Social Connections: Not on file  Intimate Partner Violence: Not on file   History reviewed. No pertinent family history.    VITAL SIGNS BP 132/78   Pulse 76   Temp 97.8 F (36.6 C)   Ht 5' 5.5" (1.664 m)   Wt 105 lb 3.2 oz (47.7 kg)   BMI 17.24 kg/m   Outpatient Encounter Medications as of 09/18/2020  Medication Sig  . apixaban (ELIQUIS) 2.5 MG TABS tablet Take 2.5 mg by mouth 2 (two) times daily.  . ergocalciferol (VITAMIN D2) 1.25 MG (50000 UT) capsule Take 50,000 Units by mouth once a week. On Saturday  . ferrous sulfate 220 (44 Fe) MG/5ML solution Take by mouth daily with breakfast. 7.4 cc  . hydrocortisone (ANUSOL-HC) 2.5 % rectal cream Apply 1 application topically 2 (two) times daily as needed for hemorrhoids or anal itching.  . labetalol (NORMODYNE) 200 MG tablet Take 200 mg by mouth 2 (two) times daily. For HTN  . magnesium hydroxide (MILK OF MAGNESIA) 400 MG/5ML suspension Take 30 mLs by mouth daily as needed for mild constipation.  . NON FORMULARY Diet Type: Dys 2 (  ground), thin liquids  . Nutritional Supplements (ENSURE ENLIVE PO) Take 1 Bottle by mouth 3 (three) times daily with meals.   . sodium polystyrene (KAYEXALATE) 15 GM/60ML suspension 36m; oral  Special Instructions: Mix with 1252mof water Provided, shake vigorously and immediately prior to administration.MEDICINE IN REFRIGERATOR. Dx: Renal insufficiency with hyperkalemia Once A Day on Sun, Tue, Thu 12:00 PM  . Zinc-Vitamin C (ZINC-A-COLD/VITAMIN C MT) Use as directed 1 lozenge in the mouth or throat 5 (five) times daily.   No facility-administered encounter medications on file as of 09/18/2020.     SIGNIFICANT DIAGNOSTIC EXAMS   LABS REVIEWED PREVIOUS:   10-12-19: vit D 72.53  02-18-20: wbc 6.2;  hgb 9.7; hct 31.2; mcv 108.7 plt 174; glucose 09; bun 74; creat 1.30 k+ 4.3; na++ 141; ca 10.6; liver normal albumin 3.5 tsh 2.712; vit D 56.74   NO NEW LABS.   Review of Systems  Unable to perform ROS: Dementia (unable to participate )   Physical Exam Constitutional:      General: She is not in acute distress.    Appearance: She is well-developed and well-nourished. She is not diaphoretic.  Neck:     Thyroid: No thyromegaly.  Cardiovascular:     Rate and Rhythm: Normal rate and regular rhythm.     Pulses: Normal pulses and intact distal pulses.     Heart sounds: Normal heart sounds.  Pulmonary:     Effort: Pulmonary effort is normal. No respiratory distress.     Breath sounds: Normal breath sounds.  Abdominal:     General: Bowel sounds are normal. There is no distension.     Palpations: Abdomen is soft.     Tenderness: There is no abdominal tenderness.  Musculoskeletal:        General: No edema. Normal range of motion.     Cervical back: Neck supple.     Right lower leg: No edema.     Left lower leg: No edema.  Lymphadenopathy:     Cervical: No cervical adenopathy.  Skin:    General: Skin is warm and dry.  Neurological:     Mental Status: She is alert. Mental status is at baseline.  Psychiatric:        Mood and Affect: Mood and affect and mood normal.     ASSESSMENT/ PLAN:  TODAY  1. Benign hypertension with chronic kidney disease stage IV 2. Failure to thrive in adult 3. Dementia without behavioral disturbance unspecified dementia type  Will continue current medications Will continue current plan of care Will continue to monitor her status.     MD is aware of resident's narcotic use and is in agreement with current plan of care. We will attempt to wean resident as appropriate.  DeOk EdwardsP [facility]  Contact [phone] through Friday 8am- 5pm  After hours call [phone]

## 2020-09-19 ENCOUNTER — Other Ambulatory Visit (HOSPITAL_COMMUNITY)
Admission: RE | Admit: 2020-09-19 | Discharge: 2020-09-19 | Disposition: A | Discharge status: Home / Self Care | Payer: Medicare PPO | Source: Skilled Nursing Facility | Attending: Adult Health | Admitting: Adult Health

## 2020-09-19 DIAGNOSIS — R791 Abnormal coagulation profile: Secondary | ICD-10-CM | POA: Insufficient documentation

## 2020-09-19 LAB — D-DIMER, QUANTITATIVE: D-Dimer, Quant: 1.3 ug/mL-FEU — ABNORMAL HIGH (ref 0.00–0.50)

## 2020-09-22 ENCOUNTER — Non-Acute Institutional Stay (SKILLED_NURSING_FACILITY): Payer: Medicare PPO | Admitting: Adult Health

## 2020-09-22 ENCOUNTER — Encounter: Payer: Self-pay | Admitting: Adult Health

## 2020-09-22 DIAGNOSIS — M810 Age-related osteoporosis without current pathological fracture: Secondary | ICD-10-CM

## 2020-09-22 DIAGNOSIS — I129 Hypertensive chronic kidney disease with stage 1 through stage 4 chronic kidney disease, or unspecified chronic kidney disease: Secondary | ICD-10-CM

## 2020-09-22 DIAGNOSIS — D631 Anemia in chronic kidney disease: Secondary | ICD-10-CM | POA: Diagnosis not present

## 2020-09-22 DIAGNOSIS — N184 Chronic kidney disease, stage 4 (severe): Secondary | ICD-10-CM

## 2020-09-22 NOTE — Progress Notes (Signed)
Location:  [place] Room Number: 109/W Place of Service:  SNF (31)   CODE STATUS: DNR  No Known Allergies  Chief Complaint  Patient presents with  . Medical Management of Chronic Issues            Benign hypertension with chronic renal disease stage IV;   Age related osteoporosis Anemia due to stage 4 chronic kidney disease    HPI:  She is a 87 year old long term resident of this facility being seen for the management of her chronic illnesses: Benign hypertension with chronic renal disease stage IV;   Age related osteoporosis Anemia due to stage 4 chronic kidney disease. There are no reports of agitation; no reports of anxiety; no reports of uncontrolled pain.   Past Medical History:  Diagnosis Date  . Anemia   . Anxiety   . Dementia ([place])   . Dysphagia, oropharyngeal phase   . GERD (gastroesophageal reflux disease)   . Hyperpotassemia   . Hyperpotassemia   . Hypertension   . Other severe protein-calorie malnutrition     Past Surgical History:  Procedure Laterality Date  . ABDOMINAL HYSTERECTOMY    . APPENDECTOMY      Social History   Socioeconomic History  . Marital status: Widowed    Spouse name: Not on file  . Number of children: Not on file  . Years of education: Not on file  . Highest education level: Not on file  Occupational History  . Not on file  Tobacco Use  . Smoking status: Never Smoker  . Smokeless tobacco: Never Used  Vaping Use  . Vaping Use: Never used  Substance and Sexual Activity  . Alcohol use: No  . Drug use: No  . Sexual activity: Never  Other Topics Concern  . Not on file  Social History Narrative  . Not on file   Social Determinants of Health   Financial Resource Strain: Not on file  Food Insecurity: Not on file  Transportation Needs: Not on file  Physical Activity: Not on file  Stress: Not on file  Social Connections: Not on file  Intimate Partner Violence: Not on file   History reviewed. No  pertinent family history.    VITAL SIGNS BP (!) 145/89   Pulse 64   Temp 98 F (36.7 C)   Ht 5' 5.5" (1.664 m)   Wt 105 lb 3.2 oz (47.7 kg)   BMI 17.24 kg/m   Outpatient Encounter Medications as of 09/22/2020  Medication Sig  . ferrous sulfate 220 (44 Fe) MG/5ML solution Take by mouth daily with breakfast. 7.4 cc  . hydrocortisone (ANUSOL-HC) 2.5 % rectal cream Apply 1 application topically 2 (two) times daily as needed for hemorrhoids or anal itching.  . labetalol (NORMODYNE) 200 MG tablet Take 200 mg by mouth 2 (two) times daily. For HTN  . magnesium hydroxide (MILK OF MAGNESIA) 400 MG/5ML suspension Take 30 mLs by mouth daily as needed for mild constipation.  . NON FORMULARY Diet Type: Dys 2 (ground), thin liquids  . Nutritional Supplements (ENSURE ENLIVE PO) Take 1 Bottle by mouth 3 (three) times daily with meals.   . sodium polystyrene (KAYEXALATE) 15 GM/60ML suspension 63m; oral  Special Instructions: Mix with 1226mof water Provided, shake vigorously and immediately prior to administration.MEDICINE IN REFRIGERATOR. Dx: Renal insufficiency with hyperkalemia Once A Day on Sun, Tue, Thu 12:00 PM  . Zinc-Vitamin C (ZINC-A-COLD/VITAMIN C MT) Use as directed 1 lozenge in the mouth or  throat 5 (five) times daily.  Mar[place] Kitchen apixaban (ELIQUIS) 2.5 MG TABS tablet Take 2.5 mg by mouth 2 (two) times daily.   No facility-administered encounter medications on file as of 09/22/2020.     SIGNIFICANT DIAGNOSTIC EXAMS  LABS REVIEWED PREVIOUS:   02-18-20: wbc 6.2; hgb 9.7; hct 31.2; mcv 108.7 plt 174; glucose 09; bun 74; creat 1.30 k+ 4.3; na++ 141; ca 10.6; liver normal albumin 3.5 tsh 2.712; vit D 56.74   TODAY  08-28-20: wbc 5.4; hgb 9.9; hct 31.2; mcv 105.1 plt 213; glucose 89; bun 44; creat 31.2; k+ 3.9; na++ 141; ca 10.1; GFR 38; liver normal albumin 3.4; tsh 2.948; vit D 70.11 09-12-20: d-dimer: 2.41; CRP 0.8 09-19-20: d-dimer: 1.30    Review of Systems  Unable to perform ROS: Dementia  (unable to participate )   Physical Exam Constitutional:      General: She is not in acute distress.    Appearance: She is well-developed and well-nourished. She is not diaphoretic.  Neck:     Thyroid: No thyromegaly.  Cardiovascular:     Rate and Rhythm: Normal rate and regular rhythm.     Pulses: Normal pulses and intact distal pulses.     Heart sounds: Normal heart sounds.  Pulmonary:     Effort: Pulmonary effort is normal. No respiratory distress.     Breath sounds: Normal breath sounds.  Abdominal:     General: Bowel sounds are normal. There is no distension.     Palpations: Abdomen is soft.     Tenderness: There is no abdominal tenderness.  Musculoskeletal:        General: No edema. Normal range of motion.     Cervical back: Neck supple.     Right lower leg: No edema.     Left lower leg: No edema.  Lymphadenopathy:     Cervical: No cervical adenopathy.  Skin:    General: Skin is warm and dry.  Neurological:     Mental Status: She is alert. Mental status is at baseline.  Psychiatric:        Mood and Affect: Mood and affect and mood normal.     ASSESSMENT/PLAN  TODAY  1. Benign hypertension with chronic renal disease stage IV; is stable 145/89; will continue labetalol 200 mg twice daily   2. Age related osteoporosis without current pathological fracture: is stable will monitor   3. Anemia due to stage 4 chronic kidney disease is stable hgb 9.9; will continue iron daily   PREVIOUS   4. Failure to thrive in adult: is without change her current weight is 108 pounds previous 111 pounds. Will continue supplements as directed  5. Dementia without behavioral disturbance unspecified dementia type: is without significant change weight is 105 pounds previous 108 pounds; unfortunately weight loss is an expected outcome in the late stages of this disease process.   6. Stage 4 chronic kidney disease: is stable bun 74; creat 1.30 will monitor   7. GERD without esophagitis  stable will monitor her status   8. Vitamin D deficiency: stable vit D level is 70.11 will monitor   9. Chronic hyperkalemia: is stable k+ 3.9 will continue kayexalate 15 gm three times weekly        MD is aware of resident's narcotic use and is in agreement with current plan of care. We will attempt to wean resident as appropriate.  [name] [facility]  Contact [phone] Monday through Friday 8am- 5pm  After hours call [phone]

## 2020-09-24 ENCOUNTER — Non-Acute Institutional Stay (SKILLED_NURSING_FACILITY): Payer: Medicare PPO | Admitting: Adult Health

## 2020-09-24 ENCOUNTER — Other Ambulatory Visit: Payer: Self-pay | Admitting: Adult Health

## 2020-09-24 ENCOUNTER — Encounter: Payer: Self-pay | Admitting: Adult Health

## 2020-09-24 DIAGNOSIS — R627 Adult failure to thrive: Secondary | ICD-10-CM

## 2020-09-24 DIAGNOSIS — F0150 Vascular dementia without behavioral disturbance: Secondary | ICD-10-CM | POA: Diagnosis not present

## 2020-09-24 MED ORDER — MORPHINE SULFATE (CONCENTRATE) 5 MG/0.25ML PO SOLN: 5.0000 mg | Freq: Four times a day (QID) | ORAL | 0 refills | Status: AC | PRN

## 2020-09-24 NOTE — Progress Notes (Signed)
Location:  [facility] Room Number: 109/W Place of Service:  SNF (31)   CODE STATUS: DNR  No Known Allergies  Chief Complaint  Patient presents with  . Acute Visit    End of Life    HPI:  She has stopped eating; drinking and is spitting out her medications. She is not in any distress at this time. I have spoken with her family member Morgan [name] about end of life issues. Morgan [name] is 87 years old with dementia. She is developing contractures in her upper extremities; she is not responsive to stimuli. After speaking with her family will stop her medications and will begin prn roxanol for distress or indications of pain.   Past Medical History:  Diagnosis Date  . Anemia   . Anxiety   . Dementia ([place])   . Dysphagia, oropharyngeal phase   . GERD (gastroesophageal reflux disease)   . Hyperpotassemia   . Hyperpotassemia   . Hypertension   . Other severe protein-calorie malnutrition     Past Surgical History:  Procedure Laterality Date  . ABDOMINAL HYSTERECTOMY    . APPENDECTOMY      Social History   Socioeconomic History  . Marital status: Widowed    Spouse name: Not on file  . Number of children: Not on file  . Years of education: Not on file  . Highest education level: Not on file  Occupational History  . Not on file  Tobacco Use  . Smoking status: Never Smoker  . Smokeless tobacco: Never Used  Vaping Use  . Vaping Use: Never used  Substance and Sexual Activity  . Alcohol use: No  . Drug use: No  . Sexual activity: Never  Other Topics Concern  . Not on file  Social History Narrative  . Not on file   Social Determinants of Health   Financial Resource Strain: Not on file  Food Insecurity: Not on file  Transportation Needs: Not on file  Physical Activity: Not on file  Stress: Not on file  Social Connections: Not on file  Intimate Partner Violence: Not on file   History reviewed. No pertinent family history.    VITAL SIGNS BP (!) 145/89    Pulse 64   Temp (!) 97.2 F (36.2 C)   Ht 5' 5.5" (1.664 m)   Wt 105 lb 3.2 oz (47.7 kg)   BMI 17.24 kg/m   Outpatient Encounter Medications as of 09/24/2020  Medication Sig  . ferrous sulfate 220 (44 Fe) MG/5ML solution Take by mouth daily with breakfast. 7.4 cc  . hydrocortisone (ANUSOL-HC) 2.5 % rectal cream Apply 1 application topically 2 (two) times daily as needed for hemorrhoids or anal itching.  . labetalol (NORMODYNE) 200 MG tablet Take 200 mg by mouth 2 (two) times daily. For HTN  . magnesium hydroxide (MILK OF MAGNESIA) 400 MG/5ML suspension Take 30 mLs by mouth daily as needed for mild constipation.  . NON FORMULARY Diet Type: Dys 2 (ground), thin liquids  . Nutritional Supplements (ENSURE ENLIVE PO) Take 1 Bottle by mouth 3 (three) times daily with meals.   . sodium polystyrene (KAYEXALATE) 15 GM/60ML suspension 20m; oral  Special Instructions: Mix with 1236mof water Provided, shake vigorously and immediately prior to administration.MEDICINE IN REFRIGERATOR. Dx: Renal insufficiency with hyperkalemia Once A Day on Sun, Tue, Thu 12:00 PM  . Zinc-Vitamin C (ZINC-A-COLD/VITAMIN C MT) Use as directed 1 lozenge in the mouth or throat 5 (five) times daily.  . Marland Kitchenpixaban (ELIQUIS) 2.5 MG  TABS tablet Take 2.5 mg by mouth 2 (two) times daily.   No facility-administered encounter medications on file as of 09/24/2020.     SIGNIFICANT DIAGNOSTIC EXAMS   LABS REVIEWED PREVIOUS:   02-18-20: wbc 6.2; hgb 9.7; hct 31.2; mcv 108.7 plt 174; glucose 09; bun 74; creat 1.30 k+ 4.3; na++ 141; ca 10.6; liver normal albumin 3.5 tsh 2.712; vit D 56.74  08-28-20: wbc 5.4; hgb 9.9; hct 31.2; mcv 105.1 plt 213; glucose 89; bun 44; creat 31.2; k+ 3.9; na++ 141; ca 10.1; GFR 38; liver normal albumin 3.4; tsh 2.948; vit D 70.11 09-12-20: d-dimer: 2.41; CRP 0.8 09-19-20: d-dimer: 1.30   NO NEW LABS.    Review of Systems  Unable to perform ROS: Dementia (unable to participate )    Physical  Exam Constitutional:      General: She is not in acute distress.    Appearance: She is well-nourished. She is cachectic. She is not diaphoretic.  Neck:     Thyroid: No thyromegaly.  Cardiovascular:     Rate and Rhythm: Normal rate and regular rhythm.     Pulses: Normal pulses and intact distal pulses.     Heart sounds: Normal heart sounds.  Pulmonary:     Effort: Pulmonary effort is normal. No respiratory distress.     Comments: Diminished in bases  Abdominal:     General: Bowel sounds are normal. There is no distension.     Palpations: Abdomen is soft.     Tenderness: There is no abdominal tenderness.  Musculoskeletal:        General: No edema.     Cervical back: Neck supple.     Right lower leg: No edema.     Left lower leg: No edema.     Comments: Upper extremities contracting.   Lymphadenopathy:     Cervical: No cervical adenopathy.  Skin:    General: Skin is warm and dry.  Neurological:     Comments: Not aware of surroundings.   Psychiatric:        Mood and Affect: Mood and affect normal.       ASSESSMENT/ PLAN:  TODAY  1. Failure to thrive in adult 2. Vascular dementia without behavioral disturbance  Will stop all medications;  Will begin roxanol 5 mg every 6 hours as needed Will focus her care on comfort.   MD is aware of resident's narcotic use and is in agreement with current plan of care. We will attempt to wean resident as appropriate.  [name] [facility]  Contact [phone] Monday through Friday 8am- 5pm  After hours call [phone]

## 2020-10-14 DEATH — deceased
